# Patient Record
Sex: Female | Born: 1960 | Race: White | Hispanic: No | Marital: Married | Smoking: Current every day smoker
Health system: Southern US, Community
[De-identification: ages and names within clinical notes are randomized; demographics above are authoritative.]

## PROBLEM LIST (undated history)

## (undated) DIAGNOSIS — J449 Chronic obstructive pulmonary disease, unspecified: Secondary | ICD-10-CM

## (undated) DIAGNOSIS — M1990 Unspecified osteoarthritis, unspecified site: Secondary | ICD-10-CM

## (undated) DIAGNOSIS — D72829 Elevated white blood cell count, unspecified: Secondary | ICD-10-CM

## (undated) DIAGNOSIS — E041 Nontoxic single thyroid nodule: Secondary | ICD-10-CM

## (undated) DIAGNOSIS — R519 Headache, unspecified: Secondary | ICD-10-CM

## (undated) DIAGNOSIS — K219 Gastro-esophageal reflux disease without esophagitis: Secondary | ICD-10-CM

## (undated) DIAGNOSIS — N63 Unspecified lump in unspecified breast: Secondary | ICD-10-CM

## (undated) DIAGNOSIS — R51 Headache: Secondary | ICD-10-CM

## (undated) DIAGNOSIS — M419 Scoliosis, unspecified: Secondary | ICD-10-CM

## (undated) DIAGNOSIS — J439 Emphysema, unspecified: Secondary | ICD-10-CM

## (undated) DIAGNOSIS — D751 Secondary polycythemia: Secondary | ICD-10-CM

## (undated) DIAGNOSIS — E162 Hypoglycemia, unspecified: Secondary | ICD-10-CM

## (undated) HISTORY — PX: BREAST BIOPSY: SHX20

## (undated) HISTORY — DX: Unspecified lump in unspecified breast: N63.0

## (undated) HISTORY — PX: TOE SURGERY: SHX1073

## (undated) HISTORY — DX: Chronic obstructive pulmonary disease, unspecified: J44.9

## (undated) HISTORY — DX: Hypoglycemia, unspecified: E16.2

## (undated) HISTORY — PX: ABDOMINAL HYSTERECTOMY: SHX81

## (undated) HISTORY — PX: CEREBRAL ANGIOGRAM: SHX1326

## (undated) HISTORY — DX: Scoliosis, unspecified: M41.9

## (undated) HISTORY — DX: Elevated white blood cell count, unspecified: D72.829

## (undated) HISTORY — PX: CHOLECYSTECTOMY: SHX55

## (undated) HISTORY — DX: Secondary polycythemia: D75.1

## (undated) HISTORY — DX: Headache, unspecified: R51.9

## (undated) HISTORY — DX: Headache: R51

## (undated) HISTORY — DX: Gastro-esophageal reflux disease without esophagitis: K21.9

---
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
## 2003-08-23 DIAGNOSIS — N63 Unspecified lump in unspecified breast: Secondary | ICD-10-CM

## 2003-08-23 HISTORY — DX: Unspecified lump in unspecified breast: N63.0

## 2004-06-15 ENCOUNTER — Emergency Department: Payer: Self-pay | Admitting: Internal Medicine

## 2005-01-26 ENCOUNTER — Emergency Department: Payer: Self-pay | Admitting: Emergency Medicine

## 2005-03-22 ENCOUNTER — Ambulatory Visit: Payer: Self-pay

## 2005-07-08 ENCOUNTER — Ambulatory Visit: Payer: Self-pay

## 2005-08-24 ENCOUNTER — Ambulatory Visit: Payer: Self-pay

## 2005-09-26 ENCOUNTER — Ambulatory Visit: Payer: Self-pay

## 2005-10-20 ENCOUNTER — Ambulatory Visit: Payer: Self-pay

## 2005-11-20 ENCOUNTER — Ambulatory Visit: Payer: Self-pay

## 2006-08-11 ENCOUNTER — Ambulatory Visit: Payer: Self-pay

## 2006-08-31 ENCOUNTER — Ambulatory Visit (HOSPITAL_COMMUNITY): Admission: RE | Admit: 2006-08-31 | Discharge: 2006-08-31 | Payer: Self-pay | Admitting: Neurosurgery

## 2006-11-03 ENCOUNTER — Emergency Department: Payer: Self-pay | Admitting: Emergency Medicine

## 2006-11-03 ENCOUNTER — Other Ambulatory Visit: Payer: Self-pay

## 2006-12-07 ENCOUNTER — Ambulatory Visit: Payer: Self-pay | Admitting: Neurosurgery

## 2009-01-14 ENCOUNTER — Emergency Department: Payer: Self-pay | Admitting: Internal Medicine

## 2009-04-09 ENCOUNTER — Emergency Department: Payer: Self-pay | Admitting: Emergency Medicine

## 2009-08-09 ENCOUNTER — Emergency Department: Payer: Self-pay | Admitting: Emergency Medicine

## 2010-07-26 ENCOUNTER — Emergency Department: Payer: Self-pay | Admitting: Emergency Medicine

## 2011-03-20 ENCOUNTER — Emergency Department: Payer: Self-pay | Admitting: Internal Medicine

## 2011-12-25 ENCOUNTER — Emergency Department: Payer: Self-pay | Admitting: Emergency Medicine

## 2013-09-14 ENCOUNTER — Emergency Department: Payer: Self-pay | Admitting: Emergency Medicine

## 2013-12-27 ENCOUNTER — Ambulatory Visit: Payer: Self-pay | Admitting: Gastroenterology

## 2013-12-27 DIAGNOSIS — Z9889 Other specified postprocedural states: Secondary | ICD-10-CM | POA: Insufficient documentation

## 2013-12-27 LAB — HM COLONOSCOPY

## 2013-12-30 LAB — PATHOLOGY REPORT

## 2014-04-27 ENCOUNTER — Emergency Department: Payer: Self-pay | Admitting: Emergency Medicine

## 2014-06-04 DIAGNOSIS — F17209 Nicotine dependence, unspecified, with unspecified nicotine-induced disorders: Secondary | ICD-10-CM | POA: Insufficient documentation

## 2014-06-13 DIAGNOSIS — E782 Mixed hyperlipidemia: Secondary | ICD-10-CM | POA: Insufficient documentation

## 2014-06-13 DIAGNOSIS — R9439 Abnormal result of other cardiovascular function study: Secondary | ICD-10-CM | POA: Insufficient documentation

## 2014-06-17 ENCOUNTER — Ambulatory Visit: Payer: Self-pay | Admitting: Internal Medicine

## 2014-07-01 DIAGNOSIS — Z9889 Other specified postprocedural states: Secondary | ICD-10-CM | POA: Insufficient documentation

## 2014-07-29 DIAGNOSIS — G5603 Carpal tunnel syndrome, bilateral upper limbs: Secondary | ICD-10-CM | POA: Insufficient documentation

## 2014-07-29 DIAGNOSIS — J449 Chronic obstructive pulmonary disease, unspecified: Secondary | ICD-10-CM | POA: Insufficient documentation

## 2014-10-13 ENCOUNTER — Ambulatory Visit: Payer: Self-pay | Admitting: Internal Medicine

## 2014-10-21 ENCOUNTER — Ambulatory Visit
Admit: 2014-10-21 | Disposition: A | Discharge status: Home / Self Care | Payer: Self-pay | Attending: Internal Medicine | Admitting: Internal Medicine

## 2014-11-13 LAB — HM MAMMOGRAPHY: HM Mammogram: NORMAL (ref 0–4)

## 2014-11-21 ENCOUNTER — Ambulatory Visit
Admit: 2014-11-21 | Disposition: A | Discharge status: Home / Self Care | Payer: Self-pay | Attending: Internal Medicine | Admitting: Internal Medicine

## 2014-11-24 LAB — CANCER CENTER HEMATOCRIT: HCT: 42.8 % (ref 35.0–47.0)

## 2014-12-08 LAB — CANCER CENTER HEMATOCRIT: HCT: 43.6 % (ref 35.0–47.0)

## 2014-12-21 ENCOUNTER — Other Ambulatory Visit: Payer: Self-pay | Admitting: *Deleted

## 2014-12-21 DIAGNOSIS — D751 Secondary polycythemia: Secondary | ICD-10-CM

## 2014-12-22 ENCOUNTER — Other Ambulatory Visit: Payer: Self-pay | Admitting: *Deleted

## 2014-12-22 ENCOUNTER — Inpatient Hospital Stay: Payer: No Typology Code available for payment source | Attending: Internal Medicine

## 2014-12-22 ENCOUNTER — Inpatient Hospital Stay: Payer: No Typology Code available for payment source | Admitting: *Deleted

## 2014-12-22 DIAGNOSIS — D751 Secondary polycythemia: Secondary | ICD-10-CM | POA: Diagnosis present

## 2014-12-22 LAB — HEMATOCRIT: HCT: 44.8 % (ref 35.0–47.0)

## 2014-12-22 NOTE — Progress Notes (Signed)
Molly Banks presents today for phlebotomy per MD orders. Phlebotomy not needed, HCT 44.8

## 2015-01-05 ENCOUNTER — Inpatient Hospital Stay: Payer: No Typology Code available for payment source

## 2015-01-05 ENCOUNTER — Inpatient Hospital Stay: Payer: No Typology Code available for payment source | Attending: Internal Medicine

## 2015-01-05 ENCOUNTER — Other Ambulatory Visit: Payer: Self-pay | Admitting: *Deleted

## 2015-01-05 DIAGNOSIS — D751 Secondary polycythemia: Secondary | ICD-10-CM

## 2015-01-05 LAB — HEMATOCRIT: HEMATOCRIT: 47.3 % — AB (ref 35.0–47.0)

## 2015-01-08 ENCOUNTER — Telehealth: Payer: Self-pay | Admitting: *Deleted

## 2015-01-08 NOTE — Telephone Encounter (Signed)
Over night she has developed numerous bruises in her legs. States she had "blood drawn off yesterday. (Phlebotomy was on 5/16) After consulting with Dr Sherrlyn HockPandit, the patient was advised to go to the ER and she agreed to go

## 2015-01-20 ENCOUNTER — Inpatient Hospital Stay: Payer: No Typology Code available for payment source

## 2015-01-20 ENCOUNTER — Other Ambulatory Visit: Payer: Self-pay

## 2015-01-22 ENCOUNTER — Other Ambulatory Visit: Payer: Self-pay

## 2015-01-22 DIAGNOSIS — D751 Secondary polycythemia: Secondary | ICD-10-CM

## 2015-01-23 ENCOUNTER — Inpatient Hospital Stay: Payer: No Typology Code available for payment source

## 2015-01-23 ENCOUNTER — Inpatient Hospital Stay: Payer: No Typology Code available for payment source | Attending: Internal Medicine

## 2015-01-23 DIAGNOSIS — D751 Secondary polycythemia: Secondary | ICD-10-CM | POA: Insufficient documentation

## 2015-01-23 LAB — HEMATOCRIT: HCT: 42.6 % (ref 35.0–47.0)

## 2015-01-27 ENCOUNTER — Other Ambulatory Visit: Payer: Self-pay

## 2015-01-27 ENCOUNTER — Encounter: Payer: Self-pay | Admitting: Emergency Medicine

## 2015-01-27 ENCOUNTER — Emergency Department
Admission: EM | Admit: 2015-01-27 | Discharge: 2015-01-27 | Disposition: A | Discharge status: Home / Self Care | Payer: No Typology Code available for payment source | Attending: Emergency Medicine | Admitting: Emergency Medicine

## 2015-01-27 ENCOUNTER — Emergency Department: Payer: No Typology Code available for payment source

## 2015-01-27 DIAGNOSIS — Z79899 Other long term (current) drug therapy: Secondary | ICD-10-CM | POA: Insufficient documentation

## 2015-01-27 DIAGNOSIS — R109 Unspecified abdominal pain: Secondary | ICD-10-CM | POA: Diagnosis present

## 2015-01-27 DIAGNOSIS — R0789 Other chest pain: Secondary | ICD-10-CM | POA: Insufficient documentation

## 2015-01-27 DIAGNOSIS — Z720 Tobacco use: Secondary | ICD-10-CM | POA: Diagnosis not present

## 2015-01-27 DIAGNOSIS — R05 Cough: Secondary | ICD-10-CM | POA: Diagnosis not present

## 2015-01-27 DIAGNOSIS — Z7982 Long term (current) use of aspirin: Secondary | ICD-10-CM | POA: Insufficient documentation

## 2015-01-27 LAB — URINALYSIS COMPLETE WITH MICROSCOPIC (ARMC ONLY)
BILIRUBIN URINE: NEGATIVE
Glucose, UA: NEGATIVE mg/dL
KETONES UR: NEGATIVE mg/dL
Leukocytes, UA: NEGATIVE
Nitrite: NEGATIVE
PH: 7 (ref 5.0–8.0)
Protein, ur: NEGATIVE mg/dL
SPECIFIC GRAVITY, URINE: 1.003 — AB (ref 1.005–1.030)
Squamous Epithelial / LPF: NONE SEEN
WBC UA: NONE SEEN WBC/hpf (ref 0–5)

## 2015-01-27 LAB — COMPREHENSIVE METABOLIC PANEL
ALK PHOS: 100 U/L (ref 38–126)
ALT: 8 U/L — AB (ref 14–54)
AST: 20 U/L (ref 15–41)
Albumin: 3.8 g/dL (ref 3.5–5.0)
Anion gap: 9 (ref 5–15)
BUN: 9 mg/dL (ref 6–20)
CO2: 27 mmol/L (ref 22–32)
CREATININE: 0.7 mg/dL (ref 0.44–1.00)
Calcium: 8.9 mg/dL (ref 8.9–10.3)
Chloride: 108 mmol/L (ref 101–111)
GFR calc non Af Amer: >60 mL/min (ref >60)
GLUCOSE: 90 mg/dL (ref 65–99)
Potassium: 3.5 mmol/L (ref 3.5–5.1)
Sodium: 144 mmol/L (ref 135–145)
Total Bilirubin: 0.4 mg/dL (ref 0.3–1.2)
Total Protein: 6.7 g/dL (ref 6.5–8.1)

## 2015-01-27 LAB — CBC WITH DIFFERENTIAL/PLATELET
BASOS ABS: 0.1 10*3/uL (ref 0–0.1)
BASOS PCT: 1 %
EOS ABS: 0.8 10*3/uL — AB (ref 0–0.7)
EOS PCT: 8 %
HCT: 37.7 % (ref 35.0–47.0)
Hemoglobin: 12.7 g/dL (ref 12.0–16.0)
Lymphocytes Relative: 39 %
Lymphs Abs: 4 10*3/uL — ABNORMAL HIGH (ref 1.0–3.6)
MCH: 28.7 pg (ref 26.0–34.0)
MCHC: 33.7 g/dL (ref 32.0–36.0)
MCV: 85.2 fL (ref 80.0–100.0)
Monocytes Absolute: 0.8 10*3/uL (ref 0.2–0.9)
Monocytes Relative: 8 %
NEUTROS PCT: 44 %
Neutro Abs: 4.3 10*3/uL (ref 1.4–6.5)
PLATELETS: 315 10*3/uL (ref 150–440)
RBC: 4.43 MIL/uL (ref 3.80–5.20)
RDW: 14.2 % (ref 11.5–14.5)
WBC: 10.1 10*3/uL (ref 3.6–11.0)

## 2015-01-27 LAB — LIPASE, BLOOD: Lipase: 46 U/L (ref 22–51)

## 2015-01-27 LAB — TROPONIN I: Troponin I: <0.03 ng/mL (ref <0.031)

## 2015-01-27 MED ORDER — IPRATROPIUM-ALBUTEROL 0.5-2.5 (3) MG/3ML IN SOLN
RESPIRATORY_TRACT | Status: AC
  Administered 2015-01-27: 3 mL via RESPIRATORY_TRACT
  Filled 2015-01-27: qty 3

## 2015-01-27 MED ORDER — KETOROLAC TROMETHAMINE 30 MG/ML IJ SOLN
INTRAMUSCULAR | Status: AC
  Administered 2015-01-27: 30 mg via INTRAMUSCULAR
  Filled 2015-01-27: qty 1

## 2015-01-27 MED ORDER — NAPROXEN 500 MG PO TABS: 500.0000 mg | ORAL_TABLET | Freq: Two times a day (BID) | ORAL | Status: DC

## 2015-01-27 MED ORDER — KETOROLAC TROMETHAMINE 30 MG/ML IJ SOLN
30.0000 mg | Freq: Once | INTRAMUSCULAR | Status: AC
  Administered 2015-01-27: 30 mg via INTRAMUSCULAR

## 2015-01-27 MED ORDER — IPRATROPIUM-ALBUTEROL 0.5-2.5 (3) MG/3ML IN SOLN
3.0000 mL | Freq: Once | RESPIRATORY_TRACT | Status: AC
  Administered 2015-01-27: 3 mL via RESPIRATORY_TRACT

## 2015-01-27 NOTE — Discharge Instructions (Signed)

## 2015-01-27 NOTE — ED Notes (Signed)
Patient ambulatory to triage with steady gait, without difficulty or distress noted; pt reports right upper abd pain last couple days with no accomp symptoms; denies hx of same

## 2015-01-27 NOTE — ED Notes (Signed)
Patient transported to [facility] 

## 2015-01-27 NOTE — ED Notes (Signed)
Pt has pain beneath right breast.  Sx for 3 days.  Painful to cough.  Non productive. cig smoker.  No back pain. No sob.  No n/v/d.

## 2015-01-27 NOTE — ED Provider Notes (Signed)
[facility] Emergency Department Provider Note  ____________________________________________  Time seen: 10 PM  I have reviewed the triage vital signs and the nursing notes.   HISTORY  Chief Complaint Abdominal Pain      HPI Molly Banks is a 54 y.o. female who complains of right lower rib pain that is moderate but becomes severe when pressed. She describes the pain as aching generally but then sharp with pressure. She does note that she has had a cough for several days and the pain has been getting worse. She reports a history of smoking and says she has a smoker's cough. No nausea no vomiting. No fevers no chills.     Past Medical History  Diagnosis Date  . Erythrocytosis   . Leukocytosis   . COPD (chronic obstructive pulmonary disease)   . GERD (gastroesophageal reflux disease)     There are no active problems to display for this patient.   Past Surgical History  Procedure Laterality Date  . Cholecystectomy    . Abdominal hysterectomy    . Tonsillectomy      Current Outpatient Rx  Name  Route  Sig  Dispense  Refill  . albuterol (PROVENTIL HFA;VENTOLIN HFA) 108 (90 BASE) MCG/ACT inhaler   Inhalation   Inhale into the lungs.         . ALPRAZolam (XANAX) 0.25 MG tablet   Oral   Take by mouth.         Marland Kitchen aspirin 81 MG tablet   Oral   Take by mouth.         . Cyanocobalamin (B-12) 1000 MCG CAPS   Oral   Take by mouth.           Allergies Guaifenesin  Family History  Problem Relation Age of Onset  . Brain cancer Mother   . Lung cancer Father   . Lymphoma Sister   . Breast cancer Sister     Social History History  Substance Use Topics  . Smoking status: Current Every Day Smoker -- 0.50 packs/day for 30 years  . Smokeless tobacco: Current User  . Alcohol Use: No    Review of Systems  Constitutional: Negative for fever. Eyes: Negative for visual changes. ENT: Negative for sore throat Cardiovascular: Positive  for right lower rib pain Respiratory: Negative for shortness of breath. Positive for cough Gastrointestinal: Negative for abdominal pain, vomiting and diarrhea. Genitourinary: Negative for dysuria. Musculoskeletal: Negative for back pain. Skin: Negative for rash. Neurological: Negative for headaches or focal weakness   10-point ROS otherwise negative.  ____________________________________________   PHYSICAL EXAM:  VITAL SIGNS: ED Triage Vitals  Enc Vitals Group     BP 01/27/15 2111 96/65 mmHg     Pulse Rate 01/27/15 2111 84     Resp 01/27/15 2111 20     Temp 01/27/15 2111 98.1 F (36.7 C)     Temp Source 01/27/15 2111 Oral     SpO2 01/27/15 2111 99 %     Weight 01/27/15 2111 126 lb (57.153 kg)     Height 01/27/15 2111  (1.575 m)     Head Cir --      Peak Flow --      Pain Score 01/27/15 2111 7     Pain Loc --      Pain Edu? --      Excl. in GC? --      Constitutional: Alert and oriented. Well appearing and in no distress. Eyes: Conjunctivae are normal.  PERRL. ENT   Head: Normocephalic and atraumatic.   Nose: No rhinnorhea.   Mouth/Throat: Mucous membranes are moist. Cardiovascular: Normal rate, regular rhythm. Normal and symmetric distal pulses are present in all extremities. No murmurs, rubs, or gallops. Respiratory: Normal respiratory effort without tachypnea nor retractions. Breath sounds are clear and equal bilaterally.  Gastrointestinal: Soft and non-tender in all quadrants. No distention. There is no CVA tenderness. Genitourinary: deferred Musculoskeletal: Nontender with normal range of motion in all extremities. No lower extremity tenderness nor edema. Patient with significant tenderness to palpation of the right lower chest. No rash or injury appreciated Neurologic:  Normal speech and language. No gross focal neurologic deficits are appreciated. Skin:  Skin is warm, dry and intact. No rash noted. Psychiatric: Mood and affect are normal. Patient  exhibits appropriate insight and judgment.  ____________________________________________    LABS (pertinent positives/negatives)  Labs Reviewed  CBC WITH DIFFERENTIAL/PLATELET - Abnormal; Notable for the following:    Lymphs Abs 4.0 (*)    Eosinophils Absolute 0.8 (*)    All other components within normal limits  COMPREHENSIVE METABOLIC PANEL - Abnormal; Notable for the following:    ALT 8 (*)    All other components within normal limits  URINALYSIS COMPLETEWITH MICROSCOPIC ([facility] ONLY) - Abnormal; Notable for the following:    Color, Urine COLORLESS (*)    APPearance CLEAR (*)    Specific Gravity, Urine 1.003 (*)    Hgb urine dipstick 1+ (*)    Bacteria, UA RARE (*)    All other components within normal limits  LIPASE, BLOOD  TROPONIN I    ____________________________________________   EKG  ED ECG REPORT I, Jene EveryKINNER, Donnita Farina, the attending physician, personally viewed and interpreted this ECG.   Date: 01/27/2015  EKG Time: 9:31 PM  Rate: 81  Rhythm: normal EKG, normal sinus rhythm  Axis: Normal  Intervals:none  ST&T Change: Normal   ____________________________________________    RADIOLOGY  Chest x-Stallbaumer normal  ____________________________________________   PROCEDURES  Procedure(s) performed: none  Critical Care performed: none  ____________________________________________   INITIAL IMPRESSION / ASSESSMENT AND PLAN / ED COURSE  Pertinent labs & imaging results that were available during my care of the patient were reviewed by me and considered in my medical decision making (see chart for details).  Patient overall well-appearing. She has significant tenderness to palpation of the right lower chest wall. Differential diagnosis includes costochondritis, pneumonia, muscle injury, pleurisy. We'll obtain chest x-Frankowski and blood work and reevaluate the patient after giving pain medications ----------------------------------------- 11:35 PM on  01/27/2015 -----------------------------------------  Patient significantly better after the Toradol, I suspect this is muscular skeletal pain. Patient will follow-up with her PCP we will treat with NSAIDs ____________________________________________   FINAL CLINICAL IMPRESSION(S) / ED DIAGNOSES  Final diagnoses:  Chest wall pain     Jene Everyobert Breelyn Icard, MD 01/27/15 [phone]

## 2015-02-02 ENCOUNTER — Inpatient Hospital Stay: Payer: No Typology Code available for payment source

## 2015-02-02 DIAGNOSIS — D751 Secondary polycythemia: Secondary | ICD-10-CM

## 2015-02-02 LAB — HEMATOCRIT: HCT: 40 % (ref 35.0–47.0)

## 2015-02-05 ENCOUNTER — Other Ambulatory Visit: Payer: Self-pay | Admitting: *Deleted

## 2015-02-16 ENCOUNTER — Inpatient Hospital Stay: Payer: No Typology Code available for payment source

## 2015-02-16 ENCOUNTER — Other Ambulatory Visit: Payer: Self-pay

## 2015-02-16 DIAGNOSIS — D751 Secondary polycythemia: Secondary | ICD-10-CM | POA: Diagnosis not present

## 2015-02-16 LAB — HEMATOCRIT: HCT: 41.2 % (ref 35.0–47.0)

## 2015-02-17 LAB — MISC LABCORP TEST (SEND OUT): Labcorp test code: 7187

## 2015-03-02 ENCOUNTER — Inpatient Hospital Stay: Payer: No Typology Code available for payment source

## 2015-03-02 ENCOUNTER — Inpatient Hospital Stay: Payer: No Typology Code available for payment source | Attending: Internal Medicine | Admitting: Internal Medicine

## 2015-03-02 VITALS — BP 116/72 | HR 71 | Temp 98.7°F | Resp 18 | Ht 62.0 in | Wt 114.9 lb

## 2015-03-02 DIAGNOSIS — Z800 Family history of malignant neoplasm of digestive organs: Secondary | ICD-10-CM | POA: Diagnosis not present

## 2015-03-02 DIAGNOSIS — R05 Cough: Secondary | ICD-10-CM | POA: Insufficient documentation

## 2015-03-02 DIAGNOSIS — Z809 Family history of malignant neoplasm, unspecified: Secondary | ICD-10-CM | POA: Insufficient documentation

## 2015-03-02 DIAGNOSIS — K219 Gastro-esophageal reflux disease without esophagitis: Secondary | ICD-10-CM | POA: Diagnosis not present

## 2015-03-02 DIAGNOSIS — D72829 Elevated white blood cell count, unspecified: Secondary | ICD-10-CM | POA: Diagnosis not present

## 2015-03-02 DIAGNOSIS — D751 Secondary polycythemia: Secondary | ICD-10-CM | POA: Diagnosis not present

## 2015-03-02 DIAGNOSIS — Z79899 Other long term (current) drug therapy: Secondary | ICD-10-CM | POA: Insufficient documentation

## 2015-03-02 DIAGNOSIS — R0609 Other forms of dyspnea: Secondary | ICD-10-CM | POA: Insufficient documentation

## 2015-03-02 DIAGNOSIS — Z7982 Long term (current) use of aspirin: Secondary | ICD-10-CM | POA: Insufficient documentation

## 2015-03-02 DIAGNOSIS — E538 Deficiency of other specified B group vitamins: Secondary | ICD-10-CM | POA: Diagnosis not present

## 2015-03-02 DIAGNOSIS — Z803 Family history of malignant neoplasm of breast: Secondary | ICD-10-CM | POA: Diagnosis not present

## 2015-03-02 DIAGNOSIS — J449 Chronic obstructive pulmonary disease, unspecified: Secondary | ICD-10-CM | POA: Diagnosis not present

## 2015-03-02 DIAGNOSIS — F17210 Nicotine dependence, cigarettes, uncomplicated: Secondary | ICD-10-CM | POA: Insufficient documentation

## 2015-03-02 LAB — CBC WITH DIFFERENTIAL/PLATELET
BASOS ABS: 0.3 10*3/uL — AB (ref 0–0.1)
BASOS PCT: 3 %
EOS PCT: 6 %
Eosinophils Absolute: 0.5 10*3/uL (ref 0–0.7)
HEMATOCRIT: 43.1 % (ref 35.0–47.0)
Hemoglobin: 14.1 g/dL (ref 12.0–16.0)
LYMPHS ABS: 3.9 10*3/uL — AB (ref 1.0–3.6)
Lymphocytes Relative: 47 %
MCH: 27.6 pg (ref 26.0–34.0)
MCHC: 32.7 g/dL (ref 32.0–36.0)
MCV: 84.4 fL (ref 80.0–100.0)
Monocytes Absolute: 0.8 10*3/uL (ref 0.2–0.9)
Monocytes Relative: 9 %
NEUTROS PCT: 35 %
Neutro Abs: 3 10*3/uL (ref 1.4–6.5)
Platelets: 314 10*3/uL (ref 150–440)
RBC: 5.11 MIL/uL (ref 3.80–5.20)
RDW: 13.8 % (ref 11.5–14.5)
WBC: 8.4 10*3/uL (ref 3.6–11.0)

## 2015-03-02 NOTE — Progress Notes (Signed)
Pt here for f/u.  Pt has HA but she feels it is cause of the heat.

## 2015-03-10 NOTE — Progress Notes (Signed)
[facility]  [phone] Fax:([phone]) [phone]     ID: Molly Banks OB: 07-24-1961  MR#: [id number]  JYN#:[id number]  Patient Care Team: Leotis Shames, MD as PCP - General (Internal Medicine)  CHIEF COMPLAINT/DIAGNOSIS:  Persistent Erythrocytosis, mild Leukocytosis likely secondary to history of chronic smoking. Workup done on 10/13/2014 - hemoglobin 16.7, hematocrit 49.1, platelets 312, WBC 10,800, carboxyhemoglobin elevated at 7.1%, serum erythropoietin 7.5, iron study unremarkable, JAK2 V617F mutation and exon 12 analysis negative. Carboxyhemoglobin 8.3% on 02/11/2015. Gets Phlebotomy 300 mL of hematocrit 46 or higher  HISTORY OF PRESENT ILLNESS:  Patient returns for continued hematology followup, she was seen a few months ago. Hematocrit monitored in between was 49.1 on February 22, since then it has been in the range of 40.0-43.6, today it is 43.1. States that she is doing about the same and denies any new complaints, just chronic dyspnea on exertion and occasional uses inhaler. Chronic dry cough is mild and unchanged. No history of DVT, PE, TIA, stroke, or heart attack. Appetite is good. No fevers or night sweats.   REVIEW OF SYSTEMS:   ROS As in HPI above. In addition, no fever, chills or sweats. No new headaches or focal weakness.  No new mood disturbances. No  sore throat, dysphagia, hemoptysis or chest pain. No dizziness or palpitation. No abdominal pain, constipation, diarrhea, dysuria or hematuria. No new skin rash or bleeding symptoms. No new paresthesias in extremities.    PAST MEDICAL HISTORY: Reviewed. Past Medical History  Diagnosis Date  . Erythrocytosis   . Leukocytosis   . COPD (chronic obstructive pulmonary disease)   . GERD (gastroesophageal reflux disease)     PAST SURGICAL HISTORY: Reviewed. Past Surgical History  Procedure Laterality Date  . Cholecystectomy    . Abdominal hysterectomy    . Tonsillectomy      FAMILY HISTORY:  Reviewed. Family History  Problem Relation Age of Onset  . Brain cancer Mother   . Lung cancer Father   . Lymphoma Sister   . Breast cancer Sister     SOCIAL HISTORY: Reviewed. History  Substance Use Topics  . Smoking status: Current Every Day Smoker -- 0.50 packs/day for 30 years  . Smokeless tobacco: Current User  . Alcohol Use: No    Allergies  Allergen Reactions  . Guaifenesin Shortness Of Breath    Current Outpatient Prescriptions  Medication Sig Dispense Refill  . albuterol (PROVENTIL HFA;VENTOLIN HFA) 108 (90 BASE) MCG/ACT inhaler Inhale into the lungs.    Marland Kitchen aspirin 81 MG tablet Take by mouth.    . Cyanocobalamin (B-12) 1000 MCG CAPS Take by mouth.     No current facility-administered medications for this visit.    PHYSICAL EXAM: Filed Vitals:   03/02/15 1034  BP: 116/72  Pulse: 71  Temp: 98.7 F (37.1 C)  Resp: 18     Body mass index is 21 kg/(m^2).      GENERAL: Patient is alert and oriented and in no acute distress. There is no icterus. HEENT: EOMs intact. Oral exam negative for thrush or lesions. No cervical lymphadenopathy. CVS: S1S2, regular LUNGS: Bilaterally clear to auscultation, no rhonchi. ABDOMEN: Soft, nontender. No hepatosplenomegaly clinically.  EXTREMITIES: No pedal edema.  LAB RESULTS:    Component Value Date/Time   NA 144 01/27/2015 2132   K 3.5 01/27/2015 2132   CL 108 01/27/2015 2132   CO2 27 01/27/2015 2132   GLUCOSE 90 01/27/2015 2132   BUN 9 01/27/2015 2132   CREATININE 0.70  01/27/2015 2132   CALCIUM 8.9 01/27/2015 2132   PROT 6.7 01/27/2015 2132   ALBUMIN 3.8 01/27/2015 2132   AST 20 01/27/2015 2132   ALT 8* 01/27/2015 2132   ALKPHOS 100 01/27/2015 2132   BILITOT 0.4 01/27/2015 2132   GFRNONAA >60 01/27/2015 2132   GFRAA >60 01/27/2015 2132   Lab Results  Component Value Date   WBC 8.4 03/02/2015   NEUTROABS 3.0 03/02/2015   HGB 14.1 03/02/2015   HCT 43.1 03/02/2015   MCV 84.4 03/02/2015   PLT 314 03/02/2015    March 2016 - intrinsic factor antibody negative (0.9)  STUDIES: 09/30/14 - Hb 16.2, hematocrit 48.1%, platelets 220, WBC 12500, 60% neutrophils, ANC 7570, 24% lymphocytes, ALC 2978, 11.8% monocytes, AMC 1480, 3% eosinophils, 0.6% basophils, B12 low at 194, folate unremarkable.  October 2015 - Hb 15.2, Hct 44.8, WBC 9700 with unremarkable differential.  ASSESSMENT / PLAN:   1. Persistent Erythrocytosis, mild Leukocytosis likely secondary to chronic smoking  -  reviewed labs from today and recent, and discussed with patient. Hematocrit seems to be under better control and today also it is far to 3.1, she does not need phlebotomy today. Blood carboxyhemoglobin recently was elevated at 8.3%, have explained to patient that this is secondary to smoking and that she needs to quit smoking. Plan otherwise is to continue phlebotomy to keep hematocrit under control, we will monitor hematocrit once every 8 weeks and pursue phlebotomy 300 mL if it is 46 or higher.  Next MD followup at 48 weeks with CBC and make further plan of management.  2. h/o B12 deficiency - started on oral B12 in Feb 2016. Intrinsic factor antibody negative. 3. Smoking Cessation - Have again explained extreme importance of quitting smoking, patient states that she is trying to quit on her own and does not want to try patches or medication. Smoking cessation info provided. 4. In between visits, patient advised to call or come to ER in case of any new symptoms or sickness.       Janese BanksSandeep Mart Colpitts, MD   03/10/2015 7:59 AM

## 2015-04-27 ENCOUNTER — Inpatient Hospital Stay: Payer: No Typology Code available for payment source

## 2015-04-28 ENCOUNTER — Inpatient Hospital Stay: Payer: No Typology Code available for payment source | Attending: Internal Medicine

## 2015-04-28 ENCOUNTER — Inpatient Hospital Stay: Payer: No Typology Code available for payment source

## 2015-04-28 DIAGNOSIS — D751 Secondary polycythemia: Secondary | ICD-10-CM | POA: Diagnosis present

## 2015-04-28 LAB — CBC WITH DIFFERENTIAL/PLATELET
Basophils Absolute: 0.1 10*3/uL (ref 0–0.1)
Basophils Relative: 1 %
EOS ABS: 0.5 10*3/uL (ref 0–0.7)
Eosinophils Relative: 5 %
HCT: 44.1 % (ref 35.0–47.0)
HEMOGLOBIN: 15 g/dL (ref 12.0–16.0)
Lymphocytes Relative: 34 %
Lymphs Abs: 3.5 10*3/uL (ref 1.0–3.6)
MCH: 27.9 pg (ref 26.0–34.0)
MCHC: 34 g/dL (ref 32.0–36.0)
MCV: 82.2 fL (ref 80.0–100.0)
Monocytes Absolute: 0.9 10*3/uL (ref 0.2–0.9)
Monocytes Relative: 9 %
Neutro Abs: 5.2 10*3/uL (ref 1.4–6.5)
Neutrophils Relative %: 51 %
Platelets: 287 10*3/uL (ref 150–440)
RBC: 5.37 MIL/uL — ABNORMAL HIGH (ref 3.80–5.20)
RDW: 14.2 % (ref 11.5–14.5)
WBC: 10.2 10*3/uL (ref 3.6–11.0)

## 2015-05-08 DIAGNOSIS — M4722 Other spondylosis with radiculopathy, cervical region: Secondary | ICD-10-CM | POA: Insufficient documentation

## 2015-06-22 ENCOUNTER — Inpatient Hospital Stay: Payer: No Typology Code available for payment source | Attending: Family Medicine

## 2015-06-22 ENCOUNTER — Inpatient Hospital Stay: Payer: No Typology Code available for payment source

## 2015-06-22 DIAGNOSIS — D751 Secondary polycythemia: Secondary | ICD-10-CM | POA: Insufficient documentation

## 2015-06-22 LAB — CBC WITH DIFFERENTIAL/PLATELET
BASOS ABS: 0.1 10*3/uL (ref 0–0.1)
Basophils Relative: 1 %
Eosinophils Absolute: 0.4 10*3/uL (ref 0–0.7)
Eosinophils Relative: 5 %
HEMATOCRIT: 44.5 % (ref 35.0–47.0)
Hemoglobin: 14.9 g/dL (ref 12.0–16.0)
LYMPHS PCT: 28 %
Lymphs Abs: 2.5 10*3/uL (ref 1.0–3.6)
MCH: 28 pg (ref 26.0–34.0)
MCHC: 33.6 g/dL (ref 32.0–36.0)
MCV: 83.4 fL (ref 80.0–100.0)
MONO ABS: 0.9 10*3/uL (ref 0.2–0.9)
Monocytes Relative: 10 %
NEUTROS ABS: 5.1 10*3/uL (ref 1.4–6.5)
Neutrophils Relative %: 56 %
Platelets: 290 10*3/uL (ref 150–440)
RBC: 5.34 MIL/uL — AB (ref 3.80–5.20)
RDW: 15.1 % — AB (ref 11.5–14.5)
WBC: 9.1 10*3/uL (ref 3.6–11.0)

## 2015-08-17 ENCOUNTER — Inpatient Hospital Stay: Payer: No Typology Code available for payment source

## 2015-08-19 ENCOUNTER — Other Ambulatory Visit: Payer: No Typology Code available for payment source

## 2015-08-20 ENCOUNTER — Inpatient Hospital Stay: Payer: No Typology Code available for payment source | Attending: Family Medicine

## 2015-08-20 ENCOUNTER — Inpatient Hospital Stay: Payer: No Typology Code available for payment source

## 2015-08-20 DIAGNOSIS — D751 Secondary polycythemia: Secondary | ICD-10-CM | POA: Diagnosis present

## 2015-08-20 LAB — CBC WITH DIFFERENTIAL/PLATELET
BASOS PCT: 1 %
Basophils Absolute: 0.1 10*3/uL (ref 0–0.1)
Eosinophils Absolute: 0.5 10*3/uL (ref 0–0.7)
Eosinophils Relative: 5 %
HEMATOCRIT: 44.6 % (ref 35.0–47.0)
Hemoglobin: 14.9 g/dL (ref 12.0–16.0)
Lymphocytes Relative: 34 %
Lymphs Abs: 3.1 10*3/uL (ref 1.0–3.6)
MCH: 28.2 pg (ref 26.0–34.0)
MCHC: 33.5 g/dL (ref 32.0–36.0)
MCV: 84.3 fL (ref 80.0–100.0)
MONOS PCT: 10 %
Monocytes Absolute: 0.9 10*3/uL (ref 0.2–0.9)
NEUTROS ABS: 4.6 10*3/uL (ref 1.4–6.5)
Neutrophils Relative %: 50 %
Platelets: 277 10*3/uL (ref 150–440)
RBC: 5.28 MIL/uL — ABNORMAL HIGH (ref 3.80–5.20)
RDW: 14 % (ref 11.5–14.5)
WBC: 9.2 10*3/uL (ref 3.6–11.0)

## 2015-10-10 ENCOUNTER — Encounter: Payer: Self-pay | Admitting: Emergency Medicine

## 2015-10-10 ENCOUNTER — Emergency Department: Payer: BLUE CROSS/BLUE SHIELD

## 2015-10-10 ENCOUNTER — Emergency Department
Admission: EM | Admit: 2015-10-10 | Discharge: 2015-10-10 | Disposition: A | Discharge status: Home / Self Care | Payer: BLUE CROSS/BLUE SHIELD | Attending: Emergency Medicine | Admitting: Emergency Medicine

## 2015-10-10 DIAGNOSIS — S29001A Unspecified injury of muscle and tendon of front wall of thorax, initial encounter: Secondary | ICD-10-CM | POA: Insufficient documentation

## 2015-10-10 DIAGNOSIS — Y9289 Other specified places as the place of occurrence of the external cause: Secondary | ICD-10-CM | POA: Insufficient documentation

## 2015-10-10 DIAGNOSIS — S6991XA Unspecified injury of right wrist, hand and finger(s), initial encounter: Secondary | ICD-10-CM | POA: Diagnosis present

## 2015-10-10 DIAGNOSIS — S4991XA Unspecified injury of right shoulder and upper arm, initial encounter: Secondary | ICD-10-CM | POA: Insufficient documentation

## 2015-10-10 DIAGNOSIS — X58XXXA Exposure to other specified factors, initial encounter: Secondary | ICD-10-CM | POA: Insufficient documentation

## 2015-10-10 DIAGNOSIS — T148 Other injury of unspecified body region: Secondary | ICD-10-CM | POA: Insufficient documentation

## 2015-10-10 DIAGNOSIS — Z7982 Long term (current) use of aspirin: Secondary | ICD-10-CM | POA: Insufficient documentation

## 2015-10-10 DIAGNOSIS — Y990 Civilian activity done for income or pay: Secondary | ICD-10-CM | POA: Insufficient documentation

## 2015-10-10 DIAGNOSIS — Y9389 Activity, other specified: Secondary | ICD-10-CM | POA: Diagnosis not present

## 2015-10-10 DIAGNOSIS — F17210 Nicotine dependence, cigarettes, uncomplicated: Secondary | ICD-10-CM | POA: Diagnosis not present

## 2015-10-10 DIAGNOSIS — T148XXA Other injury of unspecified body region, initial encounter: Secondary | ICD-10-CM

## 2015-10-10 MED ORDER — IBUPROFEN 600 MG PO TABS: 600.0000 mg | ORAL_TABLET | Freq: Three times a day (TID) | ORAL | Status: DC | PRN

## 2015-10-10 MED ORDER — KETOROLAC TROMETHAMINE 60 MG/2ML IM SOLN
60.0000 mg | Freq: Once | INTRAMUSCULAR | Status: AC
  Administered 2015-10-10: 60 mg via INTRAMUSCULAR
  Filled 2015-10-10: qty 2

## 2015-10-10 NOTE — Discharge Instructions (Signed)
It looks like U stranger muscles in your wrist and year chest wall. Please use the Motrin 600 mg one pill 3 times a day with some food. Use ice or heat to the area which ever makes it feel better. Follow-up with your doctor or see Dr. Hyacinth Meeker the orthopedic surgeon if no better in 3 or 4 days. Please return here if you're worse. Not fall asleep with the ice or heat in places you can get burns from them.

## 2015-10-10 NOTE — ED Provider Notes (Signed)
[facility] Emergency Department Provider Note  ____________________________________________  Time seen: Approximately 10:01 AM  I have reviewed the triage vital signs and the nursing notes.   HISTORY  Chief Complaint Shoulder Pain   HPI Molly Banks is a 55 y.o. female who reports she was working hard yesterday using her arms. Today she has felt pain in her right wrist and right upper chest just below the clavicle. It's worse with movement and palpation. She denies any fever or cough shortness of breath. She denies any pain with deep breathing patient has not had this before. Patient does have a history of polycythemia.   Past Medical History  Diagnosis Date  . Erythrocytosis   . Leukocytosis   . COPD (chronic obstructive pulmonary disease) (HCC)   . GERD (gastroesophageal reflux disease)     Patient Active Problem List   Diagnosis Date Noted  . 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 Cholecystectomy    . Abdominal hysterectomy    . Tonsillectomy      Current Outpatient Rx  Name  Route  Sig  Dispense  Refill  . albuterol (PROVENTIL HFA;VENTOLIN HFA) 108 (90 BASE) MCG/ACT inhaler   Inhalation   Inhale 1-2 puffs into the lungs every 6 (six) hours as needed for wheezing or shortness of breath.          Marland Kitchen aspirin EC 325 MG tablet   Oral   Take 325 mg by mouth daily as needed. For pain.         . Cyanocobalamin (RA VITAMIN B-12 TR) 1000 MCG TBCR   Oral   Take 1,000 mcg by mouth daily as needed. For health.         . ibuprofen (ADVIL,MOTRIN) 600 MG tablet   Oral   Take 1 tablet (600 mg total) by mouth every 8 (eight) hours as needed.   15 tablet   0     Allergies Guaifenesin  Family History  Problem Relation Age of Onset  . Brain cancer Mother   . Lung cancer Father   . Lymphoma Sister   . Breast cancer Sister     Social History Social History  Substance Use Topics  .  Smoking status: Current Every Day Smoker -- 0.50 packs/day for 30 years    Types: Cigarettes  . Smokeless tobacco: Current User  . Alcohol Use: No    Review of Systems Constitutional: No fever/chills Eyes: No visual changes. ENT: No sore throat. Cardiovascular: See history of present illness Respiratory: Denies shortness of breath. Gastrointestinal: No abdominal pain.  No nausea, no vomiting.  No diarrhea.  No constipation. Genitourinary: Negative for dysuria. Musculoskeletal: Negative for back pain. Skin: Negative for rash.  10-point ROS otherwise negative.  ____________________________________________   PHYSICAL EXAM:  VITAL SIGNS: ED Triage Vitals  Enc Vitals Group     BP 10/10/15 0804 114/79 mmHg     Pulse Rate 10/10/15 0804 85     Resp 10/10/15 0804 18     Temp 10/10/15 0804 97.4 F (36.3 C)     Temp Source 10/10/15 0804 Oral     SpO2 10/10/15 0804 100 %     Weight 10/10/15 0804 130 lb (58.968 kg)     Height 10/10/15 0804  (1.575 m)     Head Cir --      Peak Flow --      Pain Score 10/10/15 0805 9     Pain  Loc --      Pain Edu? --      Excl. in GC? --     Constitutional: Alert and oriented. Well appearing and in no acute distress. Eyes: Conjunctivae are normal. PERRL. EOMI. Head: Atraumatic. Nose: No congestion/rhinnorhea. Mouth/Throat: Mucous membranes are moist.  Oropharynx non-erythematous. Neck: No stridor. Cardiovascular: Normal rate, regular rhythm. Grossly normal heart sounds.  Good peripheral circulation. Respiratory: Normal respiratory effort.  No retractions. Lungs CTAB. Chest is tender to palpation just below the right clavicle on the chest wall. Palpation there exactly reproduces her pain. Gastrointestinal: Soft and nontender. No distention. No abdominal bruits. No CVA tenderness. Musculoskeletal: No lower extremity tenderness nor edema.  No joint effusions. The right wrist is also tender. The wrist is tender to palpation there is no pain on  palpation anywhere else in the arm. There is pain with movement of the wrist or the shoulder. Pulses normal in the wrist capillary refill and sensation is normal in the finger strength is normal in fingers. Neurologic:  Normal speech and language. No gross focal neurologic deficits are appreciated.  Skin:  Skin is warm, dry and intact. No rash noted. Psychiatric: Mood and affect are normal. Speech and behavior are normal.  ____________________________________________   LABS (all labs ordered are listed, but only abnormal results are displayed)  Labs Reviewed - No data to display ____________________________________________  EKG  EKG read and interpreted by me shows normal sinus rhythm rate of 69 normal axis essentially normal EKG ____________________________________________  RADIOLOGY  Chest x-Manske and wrist x-Nanninga read by radiology as no acute disease ____________________________________________   PROCEDURES    ____________________________________________   INITIAL IMPRESSION / ASSESSMENT AND PLAN / ED COURSE  Pertinent labs & imaging results that were available during my care of the patient were reviewed by me and considered in my medical decision making (see chart for details).   ____________________________________________   FINAL CLINICAL IMPRESSION(S) / ED DIAGNOSES  Final diagnoses:  Muscle strain      Arnaldo Natal, MD 10/10/15 1009

## 2015-10-10 NOTE — ED Notes (Signed)
L shoulder pain since last night. 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 No other injury noted. Painful with slight movement.

## 2015-10-12 ENCOUNTER — Inpatient Hospital Stay: Payer: BLUE CROSS/BLUE SHIELD

## 2015-10-12 ENCOUNTER — Inpatient Hospital Stay: Payer: BLUE CROSS/BLUE SHIELD | Attending: Internal Medicine

## 2015-10-12 DIAGNOSIS — D751 Secondary polycythemia: Secondary | ICD-10-CM | POA: Insufficient documentation

## 2015-10-12 LAB — CBC WITH DIFFERENTIAL/PLATELET
Basophils Absolute: 0.1 10*3/uL (ref 0–0.1)
Basophils Relative: 1 %
EOS ABS: 0.7 10*3/uL (ref 0–0.7)
Eosinophils Relative: 8 %
HCT: 44.8 % (ref 35.0–47.0)
Hemoglobin: 15.1 g/dL (ref 12.0–16.0)
LYMPHS ABS: 2.9 10*3/uL (ref 1.0–3.6)
LYMPHS PCT: 32 %
MCH: 28.8 pg (ref 26.0–34.0)
MCHC: 33.8 g/dL (ref 32.0–36.0)
MCV: 85.1 fL (ref 80.0–100.0)
MONO ABS: 0.8 10*3/uL (ref 0.2–0.9)
Monocytes Relative: 9 %
Neutro Abs: 4.4 10*3/uL (ref 1.4–6.5)
Neutrophils Relative %: 50 %
PLATELETS: 272 10*3/uL (ref 150–440)
RBC: 5.26 MIL/uL — AB (ref 3.80–5.20)
RDW: 13.6 % (ref 11.5–14.5)
WBC: 8.9 10*3/uL (ref 3.6–11.0)

## 2015-12-07 ENCOUNTER — Inpatient Hospital Stay: Payer: BLUE CROSS/BLUE SHIELD

## 2015-12-08 ENCOUNTER — Inpatient Hospital Stay: Payer: BLUE CROSS/BLUE SHIELD | Attending: Internal Medicine

## 2015-12-08 ENCOUNTER — Inpatient Hospital Stay: Payer: BLUE CROSS/BLUE SHIELD

## 2015-12-08 DIAGNOSIS — D751 Secondary polycythemia: Secondary | ICD-10-CM | POA: Diagnosis not present

## 2015-12-08 LAB — CBC WITH DIFFERENTIAL/PLATELET
BASOS PCT: 1 %
Basophils Absolute: 0.1 10*3/uL (ref 0–0.1)
EOS ABS: 0.4 10*3/uL (ref 0–0.7)
EOS PCT: 5 %
HCT: 44.6 % (ref 35.0–47.0)
HEMOGLOBIN: 15.2 g/dL (ref 12.0–16.0)
LYMPHS ABS: 2.8 10*3/uL (ref 1.0–3.6)
Lymphocytes Relative: 29 %
MCH: 28.7 pg (ref 26.0–34.0)
MCHC: 34 g/dL (ref 32.0–36.0)
MCV: 84.3 fL (ref 80.0–100.0)
MONO ABS: 0.9 10*3/uL (ref 0.2–0.9)
MONOS PCT: 10 %
NEUTROS PCT: 55 %
Neutro Abs: 5.2 10*3/uL (ref 1.4–6.5)
PLATELETS: 281 10*3/uL (ref 150–440)
RBC: 5.3 MIL/uL — ABNORMAL HIGH (ref 3.80–5.20)
RDW: 13.6 % (ref 11.5–14.5)
WBC: 9.5 10*3/uL (ref 3.6–11.0)

## 2016-02-01 ENCOUNTER — Other Ambulatory Visit: Payer: No Typology Code available for payment source

## 2016-02-01 ENCOUNTER — Ambulatory Visit: Payer: No Typology Code available for payment source | Admitting: Internal Medicine

## 2016-02-02 ENCOUNTER — Other Ambulatory Visit: Payer: Self-pay | Admitting: *Deleted

## 2016-02-02 DIAGNOSIS — D751 Secondary polycythemia: Secondary | ICD-10-CM

## 2016-02-04 ENCOUNTER — Inpatient Hospital Stay: Payer: BLUE CROSS/BLUE SHIELD | Attending: Internal Medicine

## 2016-02-04 ENCOUNTER — Inpatient Hospital Stay: Payer: BLUE CROSS/BLUE SHIELD

## 2016-02-04 ENCOUNTER — Inpatient Hospital Stay (HOSPITAL_BASED_OUTPATIENT_CLINIC_OR_DEPARTMENT_OTHER): Payer: BLUE CROSS/BLUE SHIELD | Admitting: Internal Medicine

## 2016-02-04 VITALS — BP 100/70 | HR 79 | Temp 98.1°F | Resp 18 | Wt 113.5 lb

## 2016-02-04 DIAGNOSIS — D72819 Decreased white blood cell count, unspecified: Secondary | ICD-10-CM | POA: Diagnosis not present

## 2016-02-04 DIAGNOSIS — K219 Gastro-esophageal reflux disease without esophagitis: Secondary | ICD-10-CM | POA: Insufficient documentation

## 2016-02-04 DIAGNOSIS — Z801 Family history of malignant neoplasm of trachea, bronchus and lung: Secondary | ICD-10-CM | POA: Insufficient documentation

## 2016-02-04 DIAGNOSIS — Z808 Family history of malignant neoplasm of other organs or systems: Secondary | ICD-10-CM

## 2016-02-04 DIAGNOSIS — J449 Chronic obstructive pulmonary disease, unspecified: Secondary | ICD-10-CM

## 2016-02-04 DIAGNOSIS — E538 Deficiency of other specified B group vitamins: Secondary | ICD-10-CM | POA: Insufficient documentation

## 2016-02-04 DIAGNOSIS — Z7982 Long term (current) use of aspirin: Secondary | ICD-10-CM

## 2016-02-04 DIAGNOSIS — F17210 Nicotine dependence, cigarettes, uncomplicated: Secondary | ICD-10-CM | POA: Insufficient documentation

## 2016-02-04 DIAGNOSIS — Z79899 Other long term (current) drug therapy: Secondary | ICD-10-CM

## 2016-02-04 DIAGNOSIS — F17200 Nicotine dependence, unspecified, uncomplicated: Secondary | ICD-10-CM

## 2016-02-04 DIAGNOSIS — Z122 Encounter for screening for malignant neoplasm of respiratory organs: Secondary | ICD-10-CM | POA: Insufficient documentation

## 2016-02-04 DIAGNOSIS — Z803 Family history of malignant neoplasm of breast: Secondary | ICD-10-CM | POA: Diagnosis not present

## 2016-02-04 DIAGNOSIS — D751 Secondary polycythemia: Secondary | ICD-10-CM | POA: Diagnosis not present

## 2016-02-04 LAB — CBC
HCT: 44 % (ref 35.0–47.0)
Hemoglobin: 15 g/dL (ref 12.0–16.0)
MCH: 28.6 pg (ref 26.0–34.0)
MCHC: 34.1 g/dL (ref 32.0–36.0)
MCV: 83.9 fL (ref 80.0–100.0)
PLATELETS: 294 10*3/uL (ref 150–440)
RBC: 5.24 MIL/uL — AB (ref 3.80–5.20)
RDW: 14.1 % (ref 11.5–14.5)
WBC: 9.7 10*3/uL (ref 3.6–11.0)

## 2016-02-04 NOTE — Assessment & Plan Note (Signed)
Discussed the potential medical problems risk of malignancy with smoking. Patient not interested

## 2016-02-04 NOTE — Assessment & Plan Note (Addendum)
Secondary erythrocytosis when necessary phlebotomy- if hematocrit greater than 46. Patient does not feel any different pre-or post phlebotomy. Recommend spacing out her H&H check/phlebotomy every 3 months. Patient is agreeable.

## 2016-02-04 NOTE — Assessment & Plan Note (Addendum)
Discussed lung cancer screening given the long history of smoking pros and cons. We will refer her to lung cancer screening program. She is interested.

## 2016-02-04 NOTE — Progress Notes (Signed)
[facility] OFFICE PROGRESS NOTE  Patient Care Team: Leotis Shames, MD as PCP - General (Internal Medicine)  No matching staging information was found for the patient.    No history exists.   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 Sherrlyn Hock.  I reviewed the patient's prior charts/pertinent labs/imaging in detail; findings are summarized above.   A pleasant 55 year old female patient with above history of erythrocytosis related to smoking is here for follow-up. Patient unfortunately continues to smoke.  Denies any unusual weight gain or weight loss. Denies any unusual headaches or vision changes or double vision. No unusual cough or shortness of breath. She has chronic shortness of breath. No hemoptysis.    REVIEW OF SYSTEMS:  A complete 10 point review of system is done which is negative except mentioned above/history of present illness.   PAST MEDICAL HISTORY :  Past Medical History  Diagnosis Date  . Erythrocytosis   . Leukocytosis   . COPD (chronic obstructive pulmonary disease) (HCC)   . GERD (gastroesophageal reflux disease)     PAST SURGICAL HISTORY :   Past Surgical History  Procedure Laterality Date  . Cholecystectomy    . Abdominal hysterectomy    . Tonsillectomy      FAMILY HISTORY :   Family History  Problem Relation Age of Onset  . Brain cancer Mother   . Lung cancer Father   . Lymphoma Sister   . Breast cancer Sister     SOCIAL HISTORY:   Social History  Substance Use Topics  . Smoking status: Current Every Day Smoker -- 0.50 packs/day for 30 years    Types: Cigarettes  . Smokeless tobacco: Current User  . Alcohol Use: No    ALLERGIES:  is allergic to guaifenesin.  MEDICATIONS:  Current Outpatient Prescriptions  Medication Sig Dispense Refill  . aspirin EC 325 MG tablet Take  325 mg by mouth daily as needed. For pain.    . clindamycin (CLEOCIN) 150 MG capsule Take 450 mg by mouth.    . Cyanocobalamin (RA VITAMIN B-12 TR) 1000 MCG TBCR Take 1,000 mcg by mouth daily as needed. For health.    . ibuprofen (ADVIL,MOTRIN) 600 MG tablet Take 1 tablet (600 mg total) by mouth every 8 (eight) hours as needed. 15 tablet 0   No current facility-administered medications for this visit.    PHYSICAL EXAMINATION:   BP 100/70 mmHg  Pulse 79  Temp(Src) 98.1 F (36.7 C) (Tympanic)  Resp 18  Wt 113 lb 8.6 oz (51.5 kg)  Filed Weights   02/04/16 1402  Weight: 113 lb 8.6 oz (51.5 kg)    GENERAL: Thin built moderately nourished female patient alone. Alert, no distress and comfortable.    EYES: no pallor or icterus OROPHARYNX: no thrush or ulceration; poor dentition. NECK: supple, no masses felt LYMPH:  no palpable lymphadenopathy in the cervical, axillary or inguinal regions LUNGS: Decreased air entry. and  No wheeze or crackles HEART/CVS: regular rate & rhythm and no murmurs; No lower extremity edema ABDOMEN:abdomen soft, non-tender and normal bowel sounds Musculoskeletal:no cyanosis of digits and no clubbing  PSYCH: alert & oriented x 3 with fluent speech NEURO: no focal motor/sensory deficits SKIN:  no rashes or significant lesions  LABORATORY DATA:  I have reviewed the data as listed    Component Value Date/Time   NA 144 01/27/2015 2132  K 3.5 01/27/2015 2132   CL 108 01/27/2015 2132   CO2 27 01/27/2015 2132   GLUCOSE 90 01/27/2015 2132   BUN 9 01/27/2015 2132   CREATININE 0.70 01/27/2015 2132   CALCIUM 8.9 01/27/2015 2132   PROT 6.7 01/27/2015 2132   ALBUMIN 3.8 01/27/2015 2132   AST 20 01/27/2015 2132   ALT 8* 01/27/2015 2132   ALKPHOS 100 01/27/2015 2132   BILITOT 0.4 01/27/2015 2132   GFRNONAA >60 01/27/2015 2132   GFRAA >60 01/27/2015 2132    No results found for: SPEP, UPEP  Lab Results  Component Value Date   WBC 9.7 02/04/2016    NEUTROABS 5.2 12/08/2015   HGB 15.0 02/04/2016   HCT 44.0 02/04/2016   MCV 83.9 02/04/2016   PLT 294 02/04/2016      Chemistry      Component Value Date/Time   NA 144 01/27/2015 2132   K 3.5 01/27/2015 2132   CL 108 01/27/2015 2132   CO2 27 01/27/2015 2132   BUN 9 01/27/2015 2132   CREATININE 0.70 01/27/2015 2132      Component Value Date/Time   CALCIUM 8.9 01/27/2015 2132   ALKPHOS 100 01/27/2015 2132   AST 20 01/27/2015 2132   ALT 8* 01/27/2015 2132   BILITOT 0.4 01/27/2015 2132       RADIOGRAPHIC STUDIES: I have personally reviewed the radiological images as listed and agreed with the findings in the report. No results found.   ASSESSMENT & PLAN:  Screening for malignant neoplasm of respiratory organ Discussed lung cancer screening given the long history of smoking pros and cons. We will refer her to lung cancer screening program. She is interested.  Secondary erythrocytosis Secondary erythrocytosis when necessary phlebotomy- if hematocrit greater than 46. Patient does not feel any different pre-or post phlebotomy. Recommend spacing out her H&H check/phlebotomy every 3 months. Patient is agreeable.  Smoking Discussed the potential medical problems risk of malignancy with smoking. Patient not interested    Orders Placed This Encounter  Procedures  . Hemoglobin [facility])    Standing Status: Standing     Number of Occurrences: 3     Standing Expiration Date: 02/03/2017  . Hematocrit ([facility])    Standing Status: Standing     Number of Occurrences: 3     Standing Expiration Date: 02/03/2017   All questions were answered. The patient knows to call the clinic with any problems, questions or concerns. Follow-up with me in 6 months; H&H phlebotomy every 3 months.     Earna CoderGovinda R Brahmanday, MD 02/04/2016 2:24 PM

## 2016-05-05 ENCOUNTER — Inpatient Hospital Stay: Payer: Self-pay | Attending: Internal Medicine

## 2016-05-05 ENCOUNTER — Inpatient Hospital Stay: Payer: BLUE CROSS/BLUE SHIELD

## 2016-05-05 DIAGNOSIS — D751 Secondary polycythemia: Secondary | ICD-10-CM | POA: Insufficient documentation

## 2016-05-05 LAB — HEMATOCRIT: HCT: 42.5 % (ref 35.0–47.0)

## 2016-05-05 LAB — HEMOGLOBIN: Hemoglobin: 14.4 g/dL (ref 12.0–16.0)

## 2016-06-02 ENCOUNTER — Emergency Department
Admission: EM | Admit: 2016-06-02 | Discharge: 2016-06-02 | Disposition: A | Discharge status: Home / Self Care | Payer: Self-pay | Attending: Emergency Medicine | Admitting: Emergency Medicine

## 2016-06-02 ENCOUNTER — Emergency Department: Payer: Self-pay

## 2016-06-02 ENCOUNTER — Encounter: Payer: Self-pay | Admitting: Emergency Medicine

## 2016-06-02 DIAGNOSIS — F17210 Nicotine dependence, cigarettes, uncomplicated: Secondary | ICD-10-CM | POA: Insufficient documentation

## 2016-06-02 DIAGNOSIS — M545 Low back pain, unspecified: Secondary | ICD-10-CM

## 2016-06-02 DIAGNOSIS — R0789 Other chest pain: Secondary | ICD-10-CM | POA: Insufficient documentation

## 2016-06-02 DIAGNOSIS — J449 Chronic obstructive pulmonary disease, unspecified: Secondary | ICD-10-CM | POA: Insufficient documentation

## 2016-06-02 DIAGNOSIS — Z7982 Long term (current) use of aspirin: Secondary | ICD-10-CM | POA: Insufficient documentation

## 2016-06-02 DIAGNOSIS — Z79899 Other long term (current) drug therapy: Secondary | ICD-10-CM | POA: Insufficient documentation

## 2016-06-02 DIAGNOSIS — G43909 Migraine, unspecified, not intractable, without status migrainosus: Secondary | ICD-10-CM | POA: Insufficient documentation

## 2016-06-02 LAB — CBC WITH DIFFERENTIAL/PLATELET
BASOS ABS: 0.1 10*3/uL (ref 0–0.1)
BASOS PCT: 1 %
EOS ABS: 0.3 10*3/uL (ref 0–0.7)
EOS PCT: 3 %
HCT: 44.5 % (ref 35.0–47.0)
Hemoglobin: 15.1 g/dL (ref 12.0–16.0)
LYMPHS PCT: 22 %
Lymphs Abs: 2.4 10*3/uL (ref 1.0–3.6)
MCH: 29.3 pg (ref 26.0–34.0)
MCHC: 34 g/dL (ref 32.0–36.0)
MCV: 86.3 fL (ref 80.0–100.0)
MONO ABS: 1.2 10*3/uL — AB (ref 0.2–0.9)
Monocytes Relative: 11 %
Neutro Abs: 7 10*3/uL — ABNORMAL HIGH (ref 1.4–6.5)
Neutrophils Relative %: 63 %
PLATELETS: 217 10*3/uL (ref 150–440)
RBC: 5.16 MIL/uL (ref 3.80–5.20)
RDW: 13.9 % (ref 11.5–14.5)
WBC: 11 10*3/uL (ref 3.6–11.0)

## 2016-06-02 LAB — TROPONIN I: Troponin I: <0.03 ng/mL (ref <0.03)

## 2016-06-02 LAB — BASIC METABOLIC PANEL
ANION GAP: 6 (ref 5–15)
BUN: 8 mg/dL (ref 6–20)
CO2: 28 mmol/L (ref 22–32)
CREATININE: 0.72 mg/dL (ref 0.44–1.00)
Calcium: 8.9 mg/dL (ref 8.9–10.3)
Chloride: 106 mmol/L (ref 101–111)
GFR calc Af Amer: >60 mL/min (ref >60)
GFR calc non Af Amer: >60 mL/min (ref >60)
Glucose, Bld: 99 mg/dL (ref 65–99)
POTASSIUM: 3.6 mmol/L (ref 3.5–5.1)
Sodium: 140 mmol/L (ref 135–145)

## 2016-06-02 LAB — URINALYSIS COMPLETE WITH MICROSCOPIC (ARMC ONLY)
Bilirubin Urine: NEGATIVE
Glucose, UA: NEGATIVE mg/dL
Nitrite: NEGATIVE
PH: 7 (ref 5.0–8.0)
PROTEIN: NEGATIVE mg/dL
Specific Gravity, Urine: 1.006 (ref 1.005–1.030)

## 2016-06-02 MED ORDER — METOCLOPRAMIDE HCL 5 MG/ML IJ SOLN
10.0000 mg | Freq: Once | INTRAMUSCULAR | Status: AC
  Administered 2016-06-02: 10 mg via INTRAVENOUS
  Filled 2016-06-02: qty 2

## 2016-06-02 MED ORDER — PREDNISONE 20 MG PO TABS: 40.0000 mg | ORAL_TABLET | Freq: Every day | ORAL | 0 refills | Status: AC

## 2016-06-02 MED ORDER — NAPROXEN 500 MG PO TABS: 500.0000 mg | ORAL_TABLET | Freq: Two times a day (BID) | ORAL | 0 refills | Status: AC

## 2016-06-02 MED ORDER — DEXAMETHASONE SODIUM PHOSPHATE 10 MG/ML IJ SOLN
10.0000 mg | Freq: Once | INTRAMUSCULAR | Status: AC
  Administered 2016-06-02: 10 mg via INTRAVENOUS
  Filled 2016-06-02: qty 1

## 2016-06-02 MED ORDER — KETOROLAC TROMETHAMINE 30 MG/ML IJ SOLN
30.0000 mg | Freq: Once | INTRAMUSCULAR | Status: AC
  Administered 2016-06-02: 30 mg via INTRAVENOUS
  Filled 2016-06-02: qty 1
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
## 2016-06-02 NOTE — Discharge Instructions (Signed)
You have been seen in the Emergency Department (ED)  today for back pain.  Back pain has many possible causes some are related to muscles while others have more serious causes. Even though you were checked carefully today and your exam and evaluation were reassuring, problems may develop later or continue to unfold. Therefore it is imperative that you follow up with doctor closely for further evaluation.  Follow-up with your doctor in 1 day for further evaluation.  For pain control take: naprosyn 500mg  twice a day with meals and prednisone 40mg  for the 5 days.  When should you call for help?  Call your doctor now or seek immediate medical care if:  You have new or worsening numbness in your legs.  You have new or worsening weakness in your legs. (This could make it hard to stand up.)  You lose control of your bladder or bowels or if you are unable to urinate. You have numbness of your groin or buttock region If you develop a fever  Watch closely for changes in your health, and be sure to contact your doctor if:  Your pain gets worse.  You are not getting better after 2 weeks.  How can you care for yourself at home?  Take pain medicines exactly as directed.  If the doctor gave you a prescription medicine for pain, take it as prescribed.  If you are not taking a prescription pain medicine, ask your doctor if you can take an over-the-counter medicine like tylenol or ibuprofen. Sit or lie in positions that are most comfortable and reduce your pain. Try one of these positions when you lie down:  Lie on your back with your knees bent and supported by large pillows.  Lie on the floor with your legs on the seat of a sofa or chair.  Lie on your side with your knees and hips bent and a pillow between your legs.  Lie on your stomach if it does not make pain worse. Do not sit up in bed, and avoid soft couches and twisted positions. Bed rest can help relieve pain at first, but it delays healing. Avoid  bed rest after the first day of back pain.  Change positions every 30 minutes. If you must sit for long periods of time, take breaks from sitting. Get up and walk around, or lie in a comfortable position.  Try using a heating pad on a low or medium setting for 15 to 20 minutes every 2 or 3 hours. Try a warm shower in place of one session with the heating pad.  You can also try an ice pack for 10 to 15 minutes every 2 to 3 hours. Put a thin cloth between the ice pack and your skin.  Take short walks several times a day. You can start with 5 to 10 minutes, 3 or 4 times a day, and work up to longer walks. Walk on level surfaces and avoid hills and stairs until your back is better.  Return to work and other activities as soon as you can. Continued rest without activity is usually not good for your back.  To prevent future back pain, do exercises to stretch and strengthen your back and stomach. Learn how to use good posture, safe lifting techniques, and proper body mechanics.

## 2016-06-02 NOTE — ED Notes (Signed)
Post residual urine= 

## 2016-06-02 NOTE — ED Triage Notes (Signed)
Patient ambulatory to triage with steady gait, without difficulty or distress noted; pt reports frontal HA since yesterday; mid back pain radiating down legs tonight with no known injury

## 2016-06-03 LAB — URINE CULTURE

## 2016-08-04 ENCOUNTER — Ambulatory Visit: Payer: BLUE CROSS/BLUE SHIELD | Admitting: Internal Medicine

## 2016-08-04 ENCOUNTER — Other Ambulatory Visit: Payer: BLUE CROSS/BLUE SHIELD

## 2016-12-23 ENCOUNTER — Emergency Department
Admission: EM | Admit: 2016-12-23 | Discharge: 2016-12-23 | Disposition: A | Discharge status: Home / Self Care | Payer: Self-pay | Attending: Emergency Medicine | Admitting: Emergency Medicine

## 2016-12-23 ENCOUNTER — Encounter: Payer: Self-pay | Admitting: Emergency Medicine

## 2016-12-23 ENCOUNTER — Emergency Department: Payer: Self-pay

## 2016-12-23 DIAGNOSIS — X58XXXA Exposure to other specified factors, initial encounter: Secondary | ICD-10-CM | POA: Insufficient documentation

## 2016-12-23 DIAGNOSIS — Y999 Unspecified external cause status: Secondary | ICD-10-CM | POA: Insufficient documentation

## 2016-12-23 DIAGNOSIS — S161XXA Strain of muscle, fascia and tendon at neck level, initial encounter: Secondary | ICD-10-CM | POA: Insufficient documentation

## 2016-12-23 DIAGNOSIS — Y939 Activity, unspecified: Secondary | ICD-10-CM | POA: Insufficient documentation

## 2016-12-23 DIAGNOSIS — F17210 Nicotine dependence, cigarettes, uncomplicated: Secondary | ICD-10-CM | POA: Insufficient documentation

## 2016-12-23 DIAGNOSIS — Z7982 Long term (current) use of aspirin: Secondary | ICD-10-CM | POA: Insufficient documentation

## 2016-12-23 DIAGNOSIS — R55 Syncope and collapse: Secondary | ICD-10-CM | POA: Insufficient documentation

## 2016-12-23 DIAGNOSIS — Z79899 Other long term (current) drug therapy: Secondary | ICD-10-CM | POA: Insufficient documentation

## 2016-12-23 DIAGNOSIS — J449 Chronic obstructive pulmonary disease, unspecified: Secondary | ICD-10-CM | POA: Insufficient documentation

## 2016-12-23 DIAGNOSIS — R634 Abnormal weight loss: Secondary | ICD-10-CM | POA: Insufficient documentation

## 2016-12-23 DIAGNOSIS — Y929 Unspecified place or not applicable: Secondary | ICD-10-CM | POA: Insufficient documentation

## 2016-12-23 LAB — BASIC METABOLIC PANEL
ANION GAP: 8 (ref 5–15)
BUN: 10 mg/dL (ref 6–20)
CHLORIDE: 106 mmol/L (ref 101–111)
CO2: 26 mmol/L (ref 22–32)
Calcium: 8.9 mg/dL (ref 8.9–10.3)
Creatinine, Ser: 0.71 mg/dL (ref 0.44–1.00)
GFR calc non Af Amer: >60 mL/min (ref >60)
Glucose, Bld: 85 mg/dL (ref 65–99)
POTASSIUM: 4 mmol/L (ref 3.5–5.1)
Sodium: 140 mmol/L (ref 135–145)

## 2016-12-23 LAB — URINALYSIS, COMPLETE (UACMP) WITH MICROSCOPIC
Bacteria, UA: NONE SEEN
Bilirubin Urine: NEGATIVE
Glucose, UA: NEGATIVE mg/dL
KETONES UR: NEGATIVE mg/dL
Leukocytes, UA: NEGATIVE
Nitrite: NEGATIVE
PROTEIN: NEGATIVE mg/dL
SQUAMOUS EPITHELIAL / LPF: NONE SEEN
Specific Gravity, Urine: 1 — ABNORMAL LOW (ref 1.005–1.030)
pH: 7 (ref 5.0–8.0)

## 2016-12-23 LAB — CBC
HCT: 41.3 % (ref 35.0–47.0)
HEMOGLOBIN: 14.3 g/dL (ref 12.0–16.0)
MCH: 29.1 pg (ref 26.0–34.0)
MCHC: 34.7 g/dL (ref 32.0–36.0)
MCV: 83.9 fL (ref 80.0–100.0)
Platelets: 275 10*3/uL (ref 150–440)
RBC: 4.92 MIL/uL (ref 3.80–5.20)
RDW: 13.5 % (ref 11.5–14.5)
WBC: 10.1 10*3/uL (ref 3.6–11.0)

## 2016-12-23 MED ORDER — CYCLOBENZAPRINE HCL 10 MG PO TABS: 10.0000 mg | ORAL_TABLET | Freq: Three times a day (TID) | ORAL | 0 refills | Status: DC | PRN

## 2016-12-23 NOTE — ED Notes (Signed)
Pt returned from radiology.

## 2016-12-23 NOTE — ED Notes (Signed)

## 2016-12-23 NOTE — ED Notes (Signed)
See triage note.  Pt is in nad and ambulates to room without difficulty.  Denies any pain or other symptoms.  Just losing weight.  Says eating normally.  Also has felt faint since yesterday.  No other sx.  Has pcp, but it takes months to get in for appt.

## 2016-12-23 NOTE — ED Provider Notes (Signed)
[facility] Emergency Department Provider Note   ____________________________________________    I have reviewed the triage vital signs and the nursing notes.   HISTORY  Chief Complaint Weakness; Weight Loss; and Neck Pain     HPI Molly Banks is a 56 y.o. female who presents with complaints of dizziness and weight loss. She also complains of neck pain which is been occurring for over a month. Patient reports over the last several days she has had several episodes where she has gotten abruptly lightheaded. This is not the first time this has happened she reports she has had this for years. She denies chest pain or palpitations. No shortness of breath. No pleurisy. No nausea or vomiting. She feels well currently and has no complaints. Today it happened while she was at work, she had been on her feet for some time but she reports there is no pattern to when it happens usually. She sat down and had something to eat and it got better. She reports she stays hydrated. She also complains of right-sided neck pain which is worse with movement of her right arm, the pain radiates to her right elbow but no further. She has been taking ibuprofen which seems to help    Past Medical History:  Diagnosis Date  . COPD (chronic obstructive pulmonary disease) (HCC)   . Erythrocytosis   . GERD (gastroesophageal reflux disease)   . Leukocytosis     Patient Active Problem List   Diagnosis Date Noted  . Smoking 02/04/2016  . Screening for malignant neoplasm of respiratory organ 02/04/2016  . B12 deficiency 02/04/2016  . 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 ABDOMINAL HYSTERECTOMY    . CHOLECYSTECTOMY    . TONSILLECTOMY      Prior to Admission medications   Medication Sig Start Date End Date Taking? Authorizing Provider  aspirin EC 325 MG tablet Take 325 mg by mouth daily as needed. For pain.    Historical  Provider, MD  Cyanocobalamin (RA VITAMIN B-12 TR) 1000 MCG TBCR Take 1,000 mcg by mouth daily as needed. For health.    Historical Provider, MD  cyclobenzaprine (FLEXERIL) 10 MG tablet Take 1 tablet (10 mg total) by mouth 3 (three) times daily as needed for muscle spasms. 12/23/16   Jene Every, MD  ibuprofen (ADVIL,MOTRIN) 600 MG tablet Take 1 tablet (600 mg total) by mouth every 8 (eight) hours as needed. 10/10/15   Arnaldo Natal, MD     Allergies Guaifenesin  Family History  Problem Relation Age of Onset  . Brain cancer Mother   . Lung cancer Father   . Lymphoma Sister   . Breast cancer Sister     Social History Social History  Substance Use Topics  . Smoking status: Current Every Day Smoker    Packs/day: 0.50    Years: 30.00    Types: Cigarettes  . Smokeless tobacco: Former Neurosurgeon  . Alcohol use No    Review of Systems  Constitutional: No fever/chills Eyes: No visual changes.  ENT: No sore throat. Cardiovascular: Denies chest pain. Respiratory: Denies shortness of breath. Gastrointestinal: No abdominal pain.  No nausea, no vomiting.   Genitourinary: Negative for dysuria. Musculoskeletal: Right neck pain as above Skin: Negative for rash. Neurological: Negative for headaches or weakness   ____________________________________________   PHYSICAL EXAM:  VITAL SIGNS: ED Triage Vitals  Enc Vitals Group     BP 12/23/16 1251 118/63  Pulse Rate 12/23/16 1251 74     Resp 12/23/16 1251 14     Temp 12/23/16 1251 97 F (36.1 C)     Temp Source 12/23/16 1251 Oral     SpO2 12/23/16 1251 98 %     Weight 12/23/16 1252 112 lb (50.8 kg)     Height 12/23/16 1252 5\' 1"  (1.549 m)     Head Circumference --      Peak Flow --      Pain Score 12/23/16 1243 6     Pain Loc --      Pain Edu? --      Excl. in GC? --     Constitutional: Alert and oriented. No acute distress. Pleasant and interactive Eyes: Conjunctivae are normal.  Head: Atraumatic. Nose: No  congestion/rhinnorhea. Mouth/Throat: Mucous membranes are moist.   Neck: No vertebral tenderness to palpation, tenderness to palpation of the insertion site of the right trapezius muscle. Normal strength in the right upper extremity, 2+ pulses. Cardiovascular: Normal rate, regular rhythm. Grossly normal heart sounds.  Good peripheral circulation. Respiratory: Normal respiratory effort.  No retractions. Lungs CTAB. Gastrointestinal: Soft and nontender. No distention.  No CVA tenderness. Genitourinary: deferred Musculoskeletal: No lower extremity tenderness nor edema.  Warm and well perfused Neurologic:  Normal speech and language. No gross focal neurologic deficits are appreciated.  Skin:  Skin is warm, dry and intact. No rash noted. Psychiatric: Mood and affect are normal. Speech and behavior are normal.  ____________________________________________   LABS (all labs ordered are listed, but only abnormal results are displayed)  Labs Reviewed  URINALYSIS, COMPLETE (UACMP) WITH MICROSCOPIC - Abnormal; Notable for the following:       Result Value   Color, Urine COLORLESS (*)    APPearance CLEAR (*)    Specific Gravity, Urine 1.000 (*)    Hgb urine dipstick SMALL (*)    All other components within normal limits  BASIC METABOLIC PANEL  CBC   ____________________________________________  EKG  ED ECG REPORT I, Jene Every, the attending physician, personally viewed and interpreted this ECG.  Date: 12/23/2016 EKG Time: 12:32 PM Rate: 77 Rhythm: normal sinus rhythm QRS Axis: normal Intervals: normal ST/T Wave abnormalities: normal Conduction Disturbances: none Narrative Interpretation: unremarkable  ____________________________________________  RADIOLOGY  Chest x-[place] ____________________________________________   PROCEDURES  Procedure(s) performed: No    Critical Care performed: No ____________________________________________   INITIAL IMPRESSION / ASSESSMENT  AND PLAN / ED COURSE  Pertinent labs & imaging results that were available during my care of the patient were reviewed by me and considered in my medical decision making (see chart for details).  Patient presents after a near syncopal episode. She is well-appearing and has no complaints currently. EKG is normal. No chest pain or palpitations. Lab work is unremarkable. Unclear etiology of her syncopal episode. In addition she also describes 20 pound weight loss over the last month. Given her history of smoking screening x-Tena performed    ____________________________________________   FINAL CLINICAL IMPRESSION(S) / ED DIAGNOSES  Final diagnoses:  Near syncope  Weight loss  Acute strain of neck muscle, initial encounter      NEW MEDICATIONS STARTED DURING THIS VISIT:  Discharge Medication List as of 12/23/2016  3:57 PM    START taking these medications   Details  cyclobenzaprine (FLEXERIL) 10 MG tablet Take 1 tablet (10 mg total) by mouth 3 (three) times daily as needed for muscle spasms., Starting Fri 12/23/2016, Print  Note:  This document was prepared using Dragon voice recognition software and may include unintentional dictation errors.    Jene Everyobert Conny Moening, MD 12/23/16 [phone]

## 2016-12-23 NOTE — ED Triage Notes (Signed)
Says she has been feeling like she is going to pass out since yesterday.  Has pain right side neck and arm, but that's been going on for a long time--just worsening.  Also says she has lost 20 pounds in last month

## 2017-01-08 ENCOUNTER — Ambulatory Visit
Admission: EM | Admit: 2017-01-08 | Discharge: 2017-01-08 | Disposition: A | Discharge status: Home / Self Care | Payer: Worker's Compensation | Attending: Family Medicine | Admitting: Family Medicine

## 2017-01-08 ENCOUNTER — Telehealth: Payer: Self-pay

## 2017-01-08 DIAGNOSIS — Z0283 Encounter for blood-alcohol and blood-drug test: Secondary | ICD-10-CM

## 2017-01-08 DIAGNOSIS — S46101A Unspecified injury of muscle, fascia and tendon of long head of biceps, right arm, initial encounter: Secondary | ICD-10-CM

## 2017-01-08 DIAGNOSIS — S161XXA Strain of muscle, fascia and tendon at neck level, initial encounter: Secondary | ICD-10-CM

## 2017-01-08 MED ORDER — METAXALONE 800 MG PO TABS: 800.0000 mg | ORAL_TABLET | Freq: Three times a day (TID) | ORAL | 0 refills | Status: DC

## 2017-01-08 MED ORDER — MELOXICAM 15 MG PO TABS: 15.0000 mg | ORAL_TABLET | Freq: Every day | ORAL | 0 refills | Status: DC

## 2017-01-08 NOTE — ED Triage Notes (Signed)
Patient states that she pulling a piece of equipment while at work this morning around 730am. Patient states that she felt something pull in her right shoulder and has had pain since.

## 2017-01-08 NOTE — Discharge Instructions (Signed)
Use ice to your neck and shoulder 20 minutes out of every 2 hours 4 times daily. Do not use the muscle relaxant, Skelaxin, during activities requiring concentration or judgment. Do not drive while taking the medication. Follow up with Glenard Haringommy Moore, FNP at [facility] occupational health in one week

## 2017-01-08 NOTE — ED Provider Notes (Signed)
CSN: [id number]     Arrival date & time 01/08/17  0845 History   First MD Initiated Contact with Patient 01/08/17 0913     Chief Complaint  Patient presents with  . Work Related Injury   (Consider location/radiation/quality/duration/timing/severity/associated sxs/prior Treatment) HPI   This a 56 year old female who was injured at work this morning. She states that she was pulling a piece of equipment that had been inadvertently double stacked she felt a pull in her trapezial area and pain in her right shoulder. He states that she has had the pain ever since the incident. She does not have any radicular pain below the level of her deltoid. Complain that it hurts to raise her arm. Her neck is also very sore to any motion.       Past Medical History:  Diagnosis Date  . COPD (chronic obstructive pulmonary disease) (HCC)   . Erythrocytosis   . GERD (gastroesophageal reflux disease)   . Leukocytosis    Past Surgical History:  Procedure Laterality Date  . ABDOMINAL HYSTERECTOMY    . CHOLECYSTECTOMY    . TONSILLECTOMY     Family History  Problem Relation Age of Onset  . Brain cancer Mother   . Lung cancer Father   . Lymphoma Sister   . Breast cancer Sister    Social History  Substance Use Topics  . Smoking status: Current Every Day Smoker    Packs/day: 0.50    Years: 30.00    Types: Cigarettes  . Smokeless tobacco: Former Neurosurgeon  . Alcohol use No   OB History    No data available     Review of Systems  Constitutional: Positive for activity change. Negative for appetite change, chills, fatigue and fever.  Musculoskeletal: Positive for myalgias, neck pain and neck stiffness.  All other systems reviewed and are negative.   Allergies  Guaifenesin  Home Medications   Prior to Admission medications   Medication Sig Start Date End Date Taking? Authorizing Provider  aspirin EC 325 MG tablet Take 325 mg by mouth daily as needed. For pain.   Yes [provider]   Cyanocobalamin (RA VITAMIN B-12 TR) 1000 MCG TBCR Take 1,000 mcg by mouth daily as needed. For health.   Yes [provider]  ibuprofen (ADVIL,MOTRIN) 600 MG tablet Take 1 tablet (600 mg total) by mouth every 8 (eight) hours as needed. 10/10/15  Yes Arnaldo Natal, MD  meloxicam (MOBIC) 15 MG tablet Take 1 tablet (15 mg total) by mouth daily. 01/08/17   Lutricia Feil, PA-C  metaxalone (SKELAXIN) 800 MG tablet Take 1 tablet (800 mg total) by mouth 3 (three) times daily. 01/08/17   Lutricia Feil, PA-C   Meds Ordered and Administered this Visit  Medications - No data to display  BP 112/75 (BP Location: Left Arm)   Pulse 81   Temp 97.7 F (36.5 C) (Oral)   Resp 17   Ht 5\' 1"  (1.549 m)   Wt 112 lb (50.8 kg)   SpO2 99%   BMI 21.16 kg/m  No data found.   Physical Exam  Constitutional: She is oriented to person, place, and time. She appears well-developed and well-nourished. No distress.  HENT:  Head: Normocephalic.  Eyes: EOM are normal. Pupils are equal, round, and reactive to light. Right eye exhibits no discharge. Left eye exhibits no discharge.  Neck:  Examination of the cervical spine shows decreased range of motion particularly to rotation left and right. Extension Is also uncomfortable.  Upper extremity sensation is intact throughout. Upper extremity strength is intact. Patient has tight tender trapezius on the right. So has pain to palpation along the paraspinous muscles on the right. Shoulder shows good range of motion actual passive and active. She has a negative arm drop test. She is a negative empty can test. He does have significant tenderness over the coracoid process.  Musculoskeletal: Normal range of motion. She exhibits tenderness.  Referred to cervical spine/neck examination  Neurological: She is alert and oriented to person, place, and time.  Skin: Skin is warm and dry. She is not diaphoretic.  Psychiatric: She has a normal mood and affect. Her behavior is  normal. Judgment and thought content normal.  Nursing note and vitals reviewed.   Urgent Care Course     Procedures (including critical care time)  Labs Review Labs Reviewed - No data to display  Imaging Review No results found.   Visual Acuity Review  Right Eye Distance:   Left Eye Distance:   Bilateral Distance:    Right Eye Near:   Left Eye Near:    Bilateral Near:         MDM   1. Cervical strain, acute, initial encounter   2. Injury of tendon of long head of right biceps, initial encounter    Discharge Medication List as of 01/08/2017  9:47 AM     Discharge Medication List as of 01/08/2017  9:47 AM    Plan: 1. Test/x-[place] results and diagnosis reviewed with patient 2. rx as per orders; risks, benefits, potential side effects reviewed with patient 3. Recommend supportive treatment with rest and symptom avoidance. Use ice to neck and shoulder 20 minutes out of every 2 hours 4 times daily for comfort. Induration with the use of Skelaxin requiring activities require concentration and judgment. Do not drive while taking Skelaxin. Follow up in one week with Glenard Haringommy Moore FNP at [facility] occupational [facility]. Light duty restrictions were provided to the patient. He wishes very badly to return to work today. 4. F/u prn if symptoms worsen or don't improve     Lutricia FeilRoemer, William P, PA-C 01/08/17 1010

## 2017-01-08 NOTE — ED Notes (Signed)
Patient came in today as a workers comp injury- Profile states that employee will bring a COC for drug screening. Patient reports that she was not given any paperwork. Patient provided number for Supervisor- Crystal [phone]. I have attempted to call 3 times without answer and no VM set up. Will obtain UDS using our COC for [facility].

## 2017-03-30 ENCOUNTER — Ambulatory Visit (INDEPENDENT_AMBULATORY_CARE_PROVIDER_SITE_OTHER): Payer: BLUE CROSS/BLUE SHIELD | Admitting: Nurse Practitioner

## 2017-03-30 ENCOUNTER — Encounter: Payer: Self-pay | Admitting: Nurse Practitioner

## 2017-03-30 VITALS — BP 102/59 | HR 65 | Temp 97.7°F | Ht 63.0 in | Wt 112.8 lb

## 2017-03-30 DIAGNOSIS — N3941 Urge incontinence: Secondary | ICD-10-CM

## 2017-03-30 DIAGNOSIS — Z720 Tobacco use: Secondary | ICD-10-CM | POA: Diagnosis not present

## 2017-03-30 DIAGNOSIS — Z7689 Persons encountering health services in other specified circumstances: Secondary | ICD-10-CM

## 2017-03-30 DIAGNOSIS — Z716 Tobacco abuse counseling: Secondary | ICD-10-CM

## 2017-03-30 MED ORDER — BUPROPION HCL ER (SR) 150 MG PO TB12
ORAL_TABLET | ORAL | 2 refills | Status: DC
Start: 2017-03-30 — End: 2017-07-25

## 2017-03-30 MED ORDER — OXYBUTYNIN CHLORIDE ER 10 MG PO TB24: 10.0000 mg | ORAL_TABLET | Freq: Every day | ORAL | 5 refills | Status: DC

## 2017-03-30 NOTE — Patient Instructions (Addendum)
Molly Banks, Thank you for coming in to clinic today.  1. For your incontinence: - Likely overactive bladder. - START oxybutynin 24 hour tablet 10 mg once daily.  2. For your smoking cessation: - Start Wellbutrin 150mg  tablets - take 1 tab daily for 3 days - Then, start taking 1 tab TWICE daily (about every 12 hours). Continue this for about 7 weeks. - To quit smoking - wait to start this treatment until you are mentally ready to quit. - Choose a quit date in the future. Start taking the medicine about 1-2 weeks away from your quit date. Reduce the number of cigarettes daily until you eventually QUIT completely on your quit date. Continue taking the Wellbutrin twice daily for total 7 weeks, we may continue for longer.   Please schedule a follow-up appointment with Molly Banks, AGNP. Return in about 6 weeks (around 05/11/2017) for smoking cessation and annual physical at your next convenience.  If you have any other questions or concerns, please feel free to call the clinic or send a message through MyChart. You may also schedule an earlier appointment if necessary.  You will receive a survey after today's visit either digitally by e-mail or paper by [employer]. Your experiences and feedback matter to Molly Banks.  Please respond so we know how we are doing as we provide care for you.   Molly Mcardle, DNP, AGNP-BC Adult Gerontology Nurse Practitioner [facility], [facility]   Overactive Bladder, Adult Overactive bladder is a group of urinary symptoms. With overactive bladder, you may suddenly feel the need to pass urine (urinate) right away. After feeling this sudden urge, you might also leak urine if you cannot get to the bathroom fast enough (urinary incontinence). These symptoms might interfere with your daily work or social activities. Overactive bladder symptoms may also wake you up at night. Overactive bladder affects the nerve signals between your bladder and your brain. Your bladder  may get the signal to empty before it is full. Very sensitive muscles can also make your bladder squeeze too soon. What are the causes? Many things can cause an overactive bladder. Possible causes include:  Urinary tract infection.  Infection of nearby tissues, such as the prostate.  Prostate enlargement.  Being pregnant with twins or more (multiples).  Surgery on the uterus or urethra.  Bladder stones, inflammation, or tumors.  Drinking too much caffeine or alcohol.  Certain medicines, especially those that you take to help your body get rid of extra fluid (diuretics) by increasing urine production.  Muscle or nerve weakness, especially from: ? A spinal cord injury. ? Stroke. ? Multiple sclerosis. ? Parkinson disease.  Diabetes. This can cause a high urine volume that fills the bladder so quickly that the normal urge to urinate is triggered very strongly.  Constipation. A buildup of too much stool can put pressure on your bladder.  What increases the risk? You may be at greater risk for overactive bladder if you:  Are an older adult.  Smoke.  Are going through menopause.  Have prostate problems.  Have a neurological disease, such as stroke, dementia, Parkinson disease, or multiple sclerosis (MS).  Eat or drink things that irritate the bladder. These include alcohol, spicy food, and caffeine.  Are overweight or obese.  What are the signs or symptoms? The signs and symptoms of an overactive bladder include:  Sudden, strong urges to urinate.  Leaking urine.  Urinating eight or more times per day.  Waking up to urinate two or  more times per night.  How is this diagnosed? Your health care provider may suspect overactive bladder based on your symptoms. The health care provider will do a physical exam and take your medical history. Blood or urine tests may also be done. For example, you might need to have a bladder function test to check how well you can hold your  urine. You might also need to see a health care provider who specializes in the urinary tract (urologist). How is this treated? Treatment for overactive bladder depends on the cause of your condition and whether it is mild or severe. Certain treatments can be done in your health care provider's office or clinic. You can also make lifestyle changes at home. Options include: Behavioral Treatments  Biofeedback. A specialist uses sensors to help you become aware of your body's signals.  Keeping a daily log of when you need to urinate and what happens after the urge. This may help you manage your condition.  Bladder training. This helps you learn to control the urge to urinate by following a schedule that directs you to urinate at regular intervals (timed voiding). At first, you might have to wait a few minutes after feeling the urge. In time, you should be able to schedule bathroom visits an hour or more apart.  Kegel exercises. These are exercises to strengthen the pelvic floor muscles, which support the bladder. Toning these muscles can help you control urination, even if your bladder muscles are overactive. A specialist will teach you how to do these exercises correctly. They require daily practice.  Weight loss. If you are obese or overweight, losing weight might relieve your symptoms of overactive bladder. Talk to your health care provider about losing weight and whether there is a specific program or method that would work best for you.  Diet change. This might help if constipation is making your overactive bladder worse. Your health care provider or a dietitian can explain ways to change what you eat to ease constipation. You might also need to consume less alcohol and caffeine or drink other fluids at different times of the day.  Stopping smoking.  Wearing pads to absorb leakage while you wait for other treatments to take effect. Physical Treatments  Electrical stimulation. Electrodes send  gentle pulses of electricity to strengthen the nerves or muscles that help to control the bladder. Sometimes, the electrodes are placed outside of the body. In other cases, they might be placed inside the body (implanted). This treatment can take several months to have an effect.  Supportive devices. Women may need a plastic device that fits into the vagina and supports the bladder (pessary). Medicines Several medicines can help treat overactive bladder and are usually used along with other treatments. Some are injected into the muscles involved in urination. Others come in pill form. Your health care provider may prescribe:  Antispasmodics. These medicines block the signals that the nerves send to the bladder. This keeps the bladder from releasing urine at the wrong time.  Tricyclic antidepressants. These types of antidepressants also relax bladder muscles.  Surgery  You may have a device implanted to help manage the nerve signals that indicate when you need to urinate.  You may have surgery to implant electrodes for electrical stimulation.  Sometimes, very severe cases of overactive bladder require surgery to change the shape of the bladder. Follow these instructions at home:  Take medicines only as directed by your health care provider.  Use any implants or a pessary as  directed by your health care provider.  Make any diet or lifestyle changes that are recommended by your health care provider. These might include: ? Drinking less fluid or drinking at different times of the day. If you need to urinate often during the night, you may need to stop drinking fluids early in the evening. ? Cutting down on caffeine or alcohol. Both can make an overactive bladder worse. Caffeine is found in coffee, tea, and sodas. ? Doing Kegel exercises to strengthen muscles. ? Losing weight if you need to. ? Eating a healthy and balanced diet to prevent constipation.  Keep a journal or log to track how  much and when you drink and also when you feel the need to urinate. This will help your health care provider to monitor your condition. Contact a health care provider if:  Your symptoms do not get better after treatment.  Your pain and discomfort are getting worse.  You have more frequent urges to urinate.  You have a fever. Get help right away if: You are not able to control your bladder at all. This information is not intended to replace advice given to you by your health care provider. Make sure you discuss any questions you have with your health care provider. Document Released: 06/04/2009 Document Revised: 01/14/2016 Document Reviewed: 01/01/2014 Elsevier Interactive Patient Education  [employer].

## 2017-03-30 NOTE — Progress Notes (Signed)
Subjective:    Patient ID: Molly Banks, female    DOB: 12/14/1960, 56 y.o.   MRN: [id number]  Molly Banks is a 57 y.o. female presenting on 03/30/2017 for Establish Care (currently on workman comp 01/08/17, (Rt cervical strain, bicep, Right tendonitis))   HPI Establish Care New Provider Pt last seen by PCP in last couple of months.  Obtain records from [place] for [facility]  Would like to have annual physical for labs soon.  Currently established with Workman's Compensation for injury and is undergoing physical therapy for right cervical strain, bicep strain, and right sided upper extremity tendonitis  "Losing weight" Reports having lost weight after new job w/ constant movement/walking about 1 year ago.  Prior to that 120-130 lbs. 116 lbs 1 year ago. 125 lbs 2 years ago. - She  reports an exercise routine that includes walking, 5-6 days per week. - Her diet is moderate in salt, high in fat, and high in carbohydrates.  Has history of hypoglycemic events in 2007 w/ passing out. - No DM diagnosis.  Recurrent difficulty w/ dizziness  Smoking Cessation Pt would like to quit and wants to quit, but hasn't been able to quit in past.  She has multiple attempts to quit by using nicotine patches, vaping, and gums without success.  Currently smokes 1/2 ppd of ultralites. Pt states "they aren't like regular cigarettes."  Urge Incontinence When she is working has to wear depends, urge incontinence.  She notes she has an urge to void, then extreme urgency to get to the restroom.  Typically is unable to get to the restroom before leaking large amounts of urine.  She does not leak small amounts.  Difficulty is less at home because she has faster access to the bathroom.   Has taken vesicare 5 mg once daily in past w/ some relief.  (approx 2007).  Pt brings paper medical chart with her from her PCP from 1980s - 2008.  Review of records indicates: - several episodes hypoglycemia.  Resolved  w/ lifestyle center treatment for regular meals. - history of dizziness.  MRI revealed possible AVM on R side of brain.  Pt underwent cerebral arteriogram which was negative for AVM or tumor.  It does not appear she proceeded with any repeat imaging after that time.   - Td administered in 2002 -Repeated? Unknown - Long-term monitoring of right breast mass and breast microcalcification    Past Medical History:  Diagnosis Date  . COPD (chronic obstructive pulmonary disease) ([place])   . Erythrocytosis   . Frequent headaches   . GERD (gastroesophageal reflux disease)   . Hypoglycemia   . Leukocytosis    Past Surgical History:  Procedure Laterality Date  . ABDOMINAL HYSTERECTOMY    . CHOLECYSTECTOMY    . TONSILLECTOMY     Social History   Social History  . Marital status: Married    Spouse name: N/A  . Number of children: N/A  . Years of education: N/A   Occupational History  . Not on file.   Social History Main Topics  . Smoking status: Current Every Day Smoker    Packs/day: 0.50    Years: 30.00    Types: Cigarettes  . Smokeless tobacco: Former Systems developer  . Alcohol use No  . Drug use: No  . Sexual activity: Not on file   Other Topics Concern  . Not on file   Social History Narrative  . No narrative on file   Family History  Problem Relation Age of Onset  . Brain cancer Mother   . Cancer Mother   . Lung cancer Father   . COPD Father   . Cancer - Lung Father   . Lymphoma Sister   . Breast cancer Sister   . Breast cancer Daughter   . Breast cancer Maternal Grandmother   . Lung cancer Maternal Grandfather    Current Outpatient Prescriptions on File Prior to Visit  Medication Sig  . ibuprofen (ADVIL,MOTRIN) 600 MG tablet Take 1 tablet (600 mg total) by mouth every 8 (eight) hours as needed.  . meloxicam (MOBIC) 15 MG tablet Take 1 tablet (15 mg total) by mouth daily. (Patient not taking: Reported on 03/30/2017)  . metaxalone (SKELAXIN) 800 MG tablet Take 1 tablet (800 mg  total) by mouth 3 (three) times daily. (Patient not taking: Reported on 03/30/2017)   No current facility-administered medications on file prior to visit.     Review of Systems  Constitutional: Negative.   HENT: Negative.   Eyes: Negative.   Respiratory: Negative.   Cardiovascular: Negative.   Gastrointestinal: Negative.   Endocrine: Negative.   Genitourinary: Positive for urgency.  Musculoskeletal: Positive for arthralgias.  Skin: Negative.   Allergic/Immunologic: Negative.   Neurological: Negative.   Hematological: Negative.   Psychiatric/Behavioral: Negative.    Per HPI unless specifically indicated above      Objective:    BP (!) 102/59 (BP Location: Right Arm, Patient Position: Sitting, Cuff Size: Small)   Pulse 65   Temp 97.7 F (36.5 C) (Oral)   Ht _0  (1.6 m)   Wt 112 lb 12.8 oz (51.2 kg)   SpO2 99%   BMI 19.98 kg/m   Wt Readings from Last 3 Encounters:  03/30/17 112 lb 12.8 oz (51.2 kg)  01/08/17 112 lb (50.8 kg)  12/23/16 112 lb (50.8 kg)    Physical Exam  General - healthy, well-appearing, NAD HEENT - Normocephalic, atraumatic Neck - supple, non-tender, no LAD Heart - RRR, no murmurs heard Lungs - Clear throughout all lobes, no wheezing, crackles, or rhonchi. Normal work of breathing. Extremeties - non-tender, no edema, cap refill < 2 seconds, peripheral pulses intact +2 bilaterally Skin - warm, dry Neuro - awake, alert, oriented x3, normal gait Psych - Normal mood and affect, normal behavior    Results for orders placed or performed during the hospital encounter of 24/26/83  Basic metabolic panel  Result Value Ref Range   Sodium 140 135 - 145 mmol/L   Potassium 4.0 3.5 - 5.1 mmol/L   Chloride 106 101 - 111 mmol/L   CO2 26 22 - 32 mmol/L   Glucose, Bld 85 65 - 99 mg/dL   BUN 10 6 - 20 mg/dL   Creatinine, Ser 0.71 0.44 - 1.00 mg/dL   Calcium 8.9 8.9 - 10.3 mg/dL   GFR calc non Af Amer >60 >60 mL/min   GFR calc Af Amer >60 >60 mL/min   Anion  gap 8 5 - 15  CBC  Result Value Ref Range   WBC 10.1 3.6 - 11.0 K/uL   RBC 4.92 3.80 - 5.20 MIL/uL   Hemoglobin 14.3 12.0 - 16.0 g/dL   HCT 41.3 35.0 - 47.0 %   MCV 83.9 80.0 - 100.0 fL   MCH 29.1 26.0 - 34.0 pg   MCHC 34.7 32.0 - 36.0 g/dL   RDW 13.5 11.5 - 14.5 %   Platelets 275 150 - 440 K/uL  Urinalysis, Complete w Microscopic  Result Value  Ref Range   Color, Urine COLORLESS (A) YELLOW   APPearance CLEAR (A) CLEAR   Specific Gravity, Urine 1.000 (L) 1.005 - 1.030   pH 7.0 5.0 - 8.0   Glucose, UA NEGATIVE NEGATIVE mg/dL   Hgb urine dipstick SMALL (A) NEGATIVE   Bilirubin Urine NEGATIVE NEGATIVE   Ketones, ur NEGATIVE NEGATIVE mg/dL   Protein, ur NEGATIVE NEGATIVE mg/dL   Nitrite NEGATIVE NEGATIVE   Leukocytes, UA NEGATIVE NEGATIVE   RBC / HPF 0-5 0 - 5 RBC/hpf   WBC, UA 0-5 0 - 5 WBC/hpf   Bacteria, UA NONE SEEN NONE SEEN   Squamous Epithelial / LPF NONE SEEN NONE SEEN      Assessment & Plan:   Problem List Items Addressed This Visit    None    Visit Diagnoses    Urge incontinence    -  Primary Pt with urge incontinence w/ large volume of urine requiring depends when unable to quickly get to a bathroom. Symptoms consistent w/ overactive bladder.  Plan: 1. Start oxybutynin 10 mg 24 hr tablet once daily.  Cautioned on side effects for dizziness, dry mouth. 2. Consider urology referral if no improvement on medications. 3. Follow up 3 months or as needed.   Relevant Medications   oxybutynin (DITROPAN XL) 10 MG 24 hr tablet   Encounter for smoking cessation counseling     Pt desires to quit smoking.  Has not been successful w/ cessation attempts in past.  Discussion about treatment options presented patch, gum, Cognitive behavioral therapy, Chantix, and Wellbutrin.  Pt desires Wellbutrin.  Discussion today >10 minutes specifically on counseling on risks of tobacco use, complications, treatment, smoking cessation as above.  Plan: 1. START bupropion 12 hr tablet 150 mg  once daily for 3 days, then take twice daily and continue for at least 7 weeks.  Set quit date 7 days after starting and aim to quit or reduce by 50%.  Can continue up to 12 weeks if needed. 2. Discussed other tips for quitting. 3. Follow up 6 weeks.    Relevant Medications   buPROPion (WELLBUTRIN SR) 150 MG 12 hr tablet   Encounter to establish care     Pt w/ need for new provider.  Pt brings old chart and recent records are in care everywhere.  Family history reviewed - Strong family history of breast cancer - BRCA positive in sister.  Pt desires BRCA testing.  Plan: - BRCA testing w/ next labs for annual physical - No recent annual physical - will perform at next available convenience for pt.      Meds ordered this encounter  Medications  . oxybutynin (DITROPAN XL) 10 MG 24 hr tablet    Sig: Take 1 tablet (10 mg total) by mouth at bedtime.    Dispense:  30 tablet    Refill:  5    Order Specific Question:   Supervising Provider    Answer:   Olin Hauser [[id number]]  . buPROPion (WELLBUTRIN SR) 150 MG 12 hr tablet    Sig: Day 1-3: take one tablet once daily.  Day 4 and after: take one tablet twice daily.    Dispense:  60 tablet    Refill:  2    Order Specific Question:   Supervising Provider    Answer:   Olin Hauser [[id number]]    Follow up plan: Return in about 6 weeks (around 05/11/2017) for smoking cessation and annual physical at your next convenience.  Cassell Smiles,  DNP, AGPCNP-BC Adult Gerontology Primary Care Nurse Practitioner [facility] 03/30/2017, 10:20 AM

## 2017-03-31 ENCOUNTER — Other Ambulatory Visit: Payer: Self-pay | Admitting: Nurse Practitioner

## 2017-03-31 DIAGNOSIS — Z0000 Encounter for general adult medical examination without abnormal findings: Secondary | ICD-10-CM

## 2017-03-31 DIAGNOSIS — Z8481 Family history of carrier of genetic disease: Secondary | ICD-10-CM

## 2017-03-31 NOTE — Addendum Note (Signed)
Addended by: Wilhelmina McardleKENNEDY, Olla Delancey R on: 03/31/2017 12:43 PM   Modules accepted: Orders

## 2017-03-31 NOTE — Progress Notes (Signed)
I have reviewed this encounter including the documentation in this note and/or discussed this patient with the provider, Wilhelmina McardleLauren Kennedy, AGPCNP-BC. I am certifying that I agree with the content of this note as supervising physician.  Saralyn PilarAlexander Joaquim Tolen, DO [facility] 03/31/2017, 12:53 PM

## 2017-04-03 ENCOUNTER — Other Ambulatory Visit: Payer: BLUE CROSS/BLUE SHIELD

## 2017-04-03 ENCOUNTER — Encounter: Payer: Self-pay | Admitting: Nurse Practitioner

## 2017-04-03 ENCOUNTER — Ambulatory Visit (INDEPENDENT_AMBULATORY_CARE_PROVIDER_SITE_OTHER): Payer: BLUE CROSS/BLUE SHIELD | Admitting: Nurse Practitioner

## 2017-04-03 VITALS — BP 108/65 | HR 65 | Temp 97.5°F | Ht 63.0 in | Wt 114.2 lb

## 2017-04-03 DIAGNOSIS — Z13820 Encounter for screening for osteoporosis: Secondary | ICD-10-CM

## 2017-04-03 DIAGNOSIS — Z1239 Encounter for other screening for malignant neoplasm of breast: Secondary | ICD-10-CM

## 2017-04-03 DIAGNOSIS — Z0000 Encounter for general adult medical examination without abnormal findings: Secondary | ICD-10-CM

## 2017-04-03 DIAGNOSIS — Z122 Encounter for screening for malignant neoplasm of respiratory organs: Secondary | ICD-10-CM

## 2017-04-03 DIAGNOSIS — Z1231 Encounter for screening mammogram for malignant neoplasm of breast: Secondary | ICD-10-CM

## 2017-04-03 DIAGNOSIS — F17209 Nicotine dependence, unspecified, with unspecified nicotine-induced disorders: Secondary | ICD-10-CM

## 2017-04-03 DIAGNOSIS — Z8481 Family history of carrier of genetic disease: Secondary | ICD-10-CM

## 2017-04-03 LAB — CBC WITH DIFFERENTIAL/PLATELET
Basophils Absolute: 75 cells/uL (ref 0–200)
Basophils Relative: 1 %
Eosinophils Absolute: 375 cells/uL (ref 15–500)
Eosinophils Relative: 5 %
HCT: 46.6 % — ABNORMAL HIGH (ref 35.0–45.0)
Hemoglobin: 15.5 g/dL (ref 11.7–15.5)
Lymphocytes Relative: 39 %
Lymphs Abs: 2925 cells/uL (ref 850–3900)
MCH: 29.1 pg (ref 27.0–33.0)
MCHC: 33.3 g/dL (ref 32.0–36.0)
MCV: 87.6 fL (ref 80.0–100.0)
MPV: 9.2 fL (ref 7.5–12.5)
Monocytes Absolute: 675 cells/uL (ref 200–950)
Monocytes Relative: 9 %
Neutro Abs: 3450 cells/uL (ref 1500–7800)
Neutrophils Relative %: 46 %
Platelets: 290 10*3/uL (ref 140–400)
RBC: 5.32 MIL/uL — ABNORMAL HIGH (ref 3.80–5.10)
RDW: 13.9 % (ref 11.0–15.0)
WBC: 7.5 10*3/uL (ref 3.8–10.8)

## 2017-04-03 NOTE — Progress Notes (Signed)
I have reviewed this encounter including the documentation in this note and/or discussed this patient with the provider, Wilhelmina McardleLauren Kennedy, AGPCNP-BC. I am certifying that I agree with the content of this note as supervising physician.  Saralyn PilarAlexander Kaimen Peine, DO [facility] 04/03/2017, 12:36 PM

## 2017-04-03 NOTE — Progress Notes (Signed)
Subjective:    Patient ID: Molly Banks, female    DOB: Jun 10, 1961, 56 y.o.   MRN: [id number]  Molly Banks is a 56 y.o. female presenting on 04/03/2017 for Annual Exam   HPI Annual Physical Exam Patient has been feeling well.  They have no acute concerns today. Sleeps 6.5 hours per night interrupted 2-3 times.  HEALTH MAINTENANCE: Weight/BMI: healthy Physical activity: active job and active when at home Diet: Regular diet, no modifications Seatbelt: always Sunscreen: rarely PAP: - not applicable (hysterectomy - not for cervical cancer) Mammogram: Due 10/2016  DEXA: unknown, likely never Colonoscopy: negative 12/2013   Tobacco User > 30 years.  Pt desires lung CA CT scan for screening.  Never performed in past.  VACCINES: Tetanus: 08/2010 Pneumonia: declines Shingles: has history of chicken pox - never received vaccine but declines today Flu: declines  Past Medical History:  Diagnosis Date  . Breast lump in female 2005  . COPD (chronic obstructive pulmonary disease) ([place])   . Erythrocytosis   . Frequent headaches    tension related  . GERD (gastroesophageal reflux disease)   . Hypoglycemia   . Leukocytosis   . Thoracic scoliosis    Left concavity   Past Surgical History:  Procedure Laterality Date  . ABDOMINAL HYSTERECTOMY     oophrectomy NOT perfomed  . CHOLECYSTECTOMY    . TONSILLECTOMY  1966   Social History   Social History  . Marital status: Married    Spouse name: N/A  . Number of children: N/A  . Years of education: N/A   Occupational History  . Not on file.   Social History Main Topics  . Smoking status: Current Every Day Smoker    Packs/day: 0.50    Years: 30.00    Types: Cigarettes  . Smokeless tobacco: Former Systems developer     Comment: 14, 21s  . Alcohol use No  . Drug use: No  . Sexual activity: Not on file   Other Topics Concern  . Not on file   Social History Narrative  . No narrative on file   Family History  Problem Relation Age of  Onset  . Brain cancer Mother   . Cancer Mother        brain  . Hiatal hernia Mother   . Lung cancer Father   . COPD Father   . Cancer - Lung Father   . Lymphoma Sister   . Breast cancer Sister 91       BRCA positive test  . Breast cancer Daughter 57  . Breast cancer Maternal Grandmother   . Lung cancer Maternal Grandfather   . Ovarian cancer Neg Hx   . Colon cancer Neg Hx   . Stroke Neg Hx   . Heart disease Neg Hx    Current Outpatient Prescriptions on File Prior to Visit  Medication Sig  . ibuprofen (ADVIL,MOTRIN) 600 MG tablet Take 1 tablet (600 mg total) by mouth every 8 (eight) hours as needed.  Marland Kitchen buPROPion (WELLBUTRIN SR) 150 MG 12 hr tablet Day 1-3: take one tablet once daily.  Day 4 and after: take one tablet twice daily. (Patient not taking: Reported on 04/03/2017)  . oxybutynin (DITROPAN XL) 10 MG 24 hr tablet Take 1 tablet (10 mg total) by mouth at bedtime. (Patient not taking: Reported on 04/03/2017)   No current facility-administered medications on file prior to visit.     Review of Systems Per HPI unless specifically indicated above  Objective:    BP 108/65 (BP Location: Right Arm, Patient Position: Sitting, Cuff Size: Small)   Pulse 65   Temp (!) 97.5 F (36.4 C) (Oral)   Ht 5' 3" (1.6 m)   Wt 114 lb 3.2 oz (51.8 kg)   SpO2 100%   BMI 20.23 kg/m   Wt Readings from Last 3 Encounters:  04/03/17 114 lb 3.2 oz (51.8 kg)  03/30/17 112 lb 12.8 oz (51.2 kg)  01/08/17 112 lb (50.8 kg)    Physical Exam  Pulmonary/Chest: Right breast exhibits mass. Right breast exhibits no inverted nipple, no nipple discharge, no skin change and no tenderness. Left breast exhibits mass. Left breast exhibits no inverted nipple, no nipple discharge, no skin change and no tenderness. Breasts are symmetrical.      General - healthy, well-appearing, NAD HEENT - Normocephalic, atraumatic, PERRL, EOMI, patent nares w/o congestion, oropharynx clear, MMM Neck - supple, non-tender,  no LAD, no thyromegaly,  no carotid bruit Heart - RRR, no murmurs heard Lungs - Clear throughout all lobes, no wheezing, crackles, or rhonchi. Normal work of breathing. Abdomen - soft, NTND, no masses, no hepatosplenomegaly, active bowel sounds GU - deferred by pt Extremeties - non-tender, no edema, cap refill < 2 seconds, peripheral pulses intact +2 bilaterally Skin - warm, dry, no rashes Neuro - awake, alert, oriented x3, CN II-X intact, intact muscle strength 5/5 bilaterally, intact distal sensation to light touch, normal coordination, normal gait Psych - Normal mood and affect, normal behavior   Results for orders placed or performed in visit on 03/30/17  HM MAMMOGRAPHY  Result Value Ref Range   HM Mammogram Self Reported Normal 0-4 Bi-Rad, Self Reported Normal  HM COLONOSCOPY  Result Value Ref Range   HM Colonoscopy See Report (in chart) See Report (in chart), Patient Reported      Assessment & Plan:   Problem List Items Addressed This Visit      Other   Tobacco use disorder, continuous Pt desires to stop smoking.  Plan already in place for Wellbutrin.  Pt has set quit date for 2 weeks from today.   Relevant Orders   Ambulatory Referral for Lung Cancer Scre    Other Visit Diagnoses    Breast cancer screening    -  Primary Pt w/ significant family history of breast cancer and BRCA positive genetic testing in her sister.  Needs biannual breast ca screening.    Plan: 1. Placed screening mammogram order.  Pt desires to request 3-D mammogram if BRCA positive after testing. 2. Lab drawn today.  Follow up after results.   Relevant Orders   MM DIGITAL SCREENING BILATERAL   Encounter for annual physical exam     Physical exam with no new findings.  Well adult with no acute concerns.  Plan: 1. Obtain health maintenance screenings. Labs drawn today. 2. Return 1 year for annual physical.    Osteoporosis screening     Pt w/ total hysterectomy.  No prior DEXA scanning.     Plan: 1. Obtain DG bone density.     Relevant Orders   DG Bone Density   Encounter for screening for lung cancer     Pt w/ > 30 year smoking history.  1 ppd x 30 years  (started smoking at age 15, quit in 37s, then resumed a couple years later).   Relevant Orders   Ambulatory Referral for Lung Cancer Scre        Follow up plan: Return in about  1 year (around 04/03/2018) for annual physical.  Cassell Smiles, DNP, AGPCNP-BC Adult Gerontology Primary Care Nurse Practitioner [facility] 04/03/2017, 9:56 AM

## 2017-04-03 NOTE — Patient Instructions (Addendum)
Jarvis, Thank you for coming in to clinic today.  1. There is nothing new for findings on your physical exam today.  2. Mammogram and bone density scan: Your mammogram and DEXA orders have been placed.  Call the Scheduling phone number at [phone] to schedule your mammogram and DEXA at your convenience.  You can choose to go to either location listed below.  Let the scheduler know which location you prefer.  They can be done on the same day.  [facility] Outpatient Radiology [address] [place], [place] [address] [place], [place] [address]    Please schedule a follow-up appointment with [name], AGNP. Return in about 1 year (around 04/03/2018) for annual physical.  If you have any other questions or concerns, please feel free to call the clinic or send a message through MyChart. You may also schedule an earlier appointment if necessary.  You will receive a survey after today's visit either digitally by e-mail or paper by [employer]. Your experiences and feedback matter to [place].  Please respond so we know how we are doing as we provide care for you.   [name], DNP, AGNP-BC Adult Gerontology Nurse Practitioner [facility], [facility]    Bone Densitometry Bone densitometry is an imaging test that uses a special X-[place]aine to measure the amount of calcium and other minerals in your bones (bone density). This test is also known as a bone mineral density test or dual-energy X-Hoe absorptiometry (DXA). The test can measure bone density at your hip and your spine. It is similar to having a regular X-Budney. You may have this test to:  Diagnose a condition that causes weak or thin bones (osteoporosis).  Predict your risk of a broken bone (fracture).  Determine how well osteoporosis treatment is working.  Tell a health care provider about:  Any allergies you have.  All  medicines you are taking, including vitamins, herbs, eye drops, creams, and over-the-counter medicines.  Any problems you or family members have had with anesthetic medicines.  Any blood disorders you have.  Any surgeries you have had.  Any medical conditions you have.  Possibility of pregnancy.  Any other medical test you had within the previous 14 days that used contrast material. What are the risks? Generally, this is a safe procedure. However, problems can occur and may include the following:  This test exposes you to a very small amount of radiation.  The risks of radiation exposure may be greater to unborn children.  What happens before the procedure?  Do not take any calcium supplements for 24 hours before having the test. You can otherwise eat and drink what you usually do.  Take off all metal jewelry, eyeglasses, dental appliances, and any other metal objects. What happens during the procedure?  You may lie on an exam table. There will be an X-Gordin generator below you and an imaging device above you.  Other devices, such as boxes or braces, may be used to position your body properly for the scan.  You will need to lie still while the machine slowly scans your body.  The images will show up on a computer monitor. What happens after the procedure? You may need more testing at a later time. This information is not intended to replace advice given to you by your health care provider. Make sure you discuss any questions you have with your health care provider. Document  Released: 08/30/2004 Document Revised: 01/14/2016 Document Reviewed: 01/16/2014 Elsevier Interactive Patient Education  [employer]2018 Elsevier Inc.

## 2017-04-04 ENCOUNTER — Telehealth: Payer: Self-pay | Admitting: *Deleted

## 2017-04-04 DIAGNOSIS — Z122 Encounter for screening for malignant neoplasm of respiratory organs: Secondary | ICD-10-CM

## 2017-04-04 DIAGNOSIS — Z87891 Personal history of nicotine dependence: Secondary | ICD-10-CM

## 2017-04-04 LAB — LIPID PANEL
Cholesterol: 205 mg/dL — ABNORMAL HIGH (ref <200)
HDL: 76 mg/dL (ref >50)
LDL Cholesterol: 117 mg/dL — ABNORMAL HIGH (ref <100)
Total CHOL/HDL Ratio: 2.7 Ratio (ref <5.0)
Triglycerides: 59 mg/dL (ref <150)
VLDL: 12 mg/dL (ref <30)

## 2017-04-04 LAB — COMPREHENSIVE METABOLIC PANEL
ALT: 3 U/L — ABNORMAL LOW (ref 6–29)
AST: 12 U/L (ref 10–35)
Albumin: 4.1 g/dL (ref 3.6–5.1)
Alkaline Phosphatase: 80 U/L (ref 33–130)
BUN: 10 mg/dL (ref 7–25)
CO2: 27 mmol/L (ref 20–32)
Calcium: 9.1 mg/dL (ref 8.6–10.4)
Chloride: 105 mmol/L (ref 98–110)
Creat: 0.7 mg/dL (ref 0.50–1.05)
Glucose, Bld: 79 mg/dL (ref 65–99)
Potassium: 4.1 mmol/L (ref 3.5–5.3)
Sodium: 141 mmol/L (ref 135–146)
Total Bilirubin: 0.6 mg/dL (ref 0.2–1.2)
Total Protein: 6.4 g/dL (ref 6.1–8.1)

## 2017-04-04 LAB — HEMOGLOBIN A1C
Hgb A1c MFr Bld: 5.2 % (ref <5.7)
Mean Plasma Glucose: 103 mg/dL

## 2017-04-04 LAB — TSH: TSH: 1.88 mIU/L

## 2017-04-04 NOTE — Telephone Encounter (Signed)
Received referral for initial lung cancer screening scan. Contacted patient and obtained smoking history,(current, 30 pack year) as well as answering questions related to screening process. Patient denies signs of lung cancer such as weight loss or hemoptysis. Patient denies comorbidity that would prevent curative treatment if lung cancer were found. Patient is scheduled for shared decision making visit and CT scan on 04/18/17.

## 2017-04-05 ENCOUNTER — Telehealth: Payer: Self-pay

## 2017-04-05 NOTE — Telephone Encounter (Signed)
The pt called twice today requesting her lab results from Monday. I informed the pt that you have not reviewed the labs, because they probably haven't all finalize. I also informed the patient that [facility] test can take over a week before it results.

## 2017-04-12 LAB — BRCAVANTAGE, COMPREHENSIVE
BRCA1 Del/Dup Interp: NOT DETECTED
BRCA1 Del/Dup: NEGATIVE
BRCA1 Seq Interp.: NOT DETECTED
BRCA1 Sequencing: NEGATIVE
BRCA2 Del/Dup Interp: NOT DETECTED
BRCA2 Del/Dup: NEGATIVE
BRCA2 Seq Intrerp: NOT DETECTED
BRCA2 Sequencing: NEGATIVE

## 2017-04-13 ENCOUNTER — Other Ambulatory Visit: Payer: Self-pay | Admitting: Family

## 2017-04-13 ENCOUNTER — Ambulatory Visit
Admission: AD | Admit: 2017-04-13 | Discharge: 2017-04-13 | Disposition: A | Discharge status: Home / Self Care | Payer: Worker's Compensation | Source: Ambulatory Visit | Attending: Family | Admitting: Family

## 2017-04-13 ENCOUNTER — Ambulatory Visit
Admission: RE | Admit: 2017-04-13 | Discharge: 2017-04-13 | Disposition: A | Discharge status: Home / Self Care | Payer: Worker's Compensation | Source: Ambulatory Visit | Attending: Family | Admitting: Family

## 2017-04-13 DIAGNOSIS — M47892 Other spondylosis, cervical region: Secondary | ICD-10-CM | POA: Insufficient documentation

## 2017-04-13 DIAGNOSIS — M542 Cervicalgia: Secondary | ICD-10-CM | POA: Diagnosis present

## 2017-04-13 DIAGNOSIS — R52 Pain, unspecified: Secondary | ICD-10-CM

## 2017-04-13 DIAGNOSIS — Z981 Arthrodesis status: Secondary | ICD-10-CM | POA: Diagnosis not present

## 2017-04-13 DIAGNOSIS — M25511 Pain in right shoulder: Secondary | ICD-10-CM | POA: Diagnosis present

## 2017-04-13 LAB — HM MAMMOGRAPHY

## 2017-04-17 ENCOUNTER — Encounter: Payer: Self-pay | Admitting: Oncology

## 2017-04-18 ENCOUNTER — Inpatient Hospital Stay: Payer: BLUE CROSS/BLUE SHIELD | Attending: Oncology | Admitting: Oncology

## 2017-04-18 ENCOUNTER — Ambulatory Visit
Admission: RE | Admit: 2017-04-18 | Discharge: 2017-04-18 | Disposition: A | Discharge status: Home / Self Care | Payer: BLUE CROSS/BLUE SHIELD | Source: Ambulatory Visit | Attending: Oncology | Admitting: Oncology

## 2017-04-18 DIAGNOSIS — I700 Atherosclerosis of aorta: Secondary | ICD-10-CM | POA: Diagnosis not present

## 2017-04-18 DIAGNOSIS — F17210 Nicotine dependence, cigarettes, uncomplicated: Secondary | ICD-10-CM | POA: Diagnosis not present

## 2017-04-18 DIAGNOSIS — Z87891 Personal history of nicotine dependence: Secondary | ICD-10-CM | POA: Insufficient documentation

## 2017-04-18 DIAGNOSIS — Z122 Encounter for screening for malignant neoplasm of respiratory organs: Secondary | ICD-10-CM

## 2017-04-18 DIAGNOSIS — E042 Nontoxic multinodular goiter: Secondary | ICD-10-CM | POA: Insufficient documentation

## 2017-04-18 DIAGNOSIS — J439 Emphysema, unspecified: Secondary | ICD-10-CM | POA: Insufficient documentation

## 2017-04-18 NOTE — Progress Notes (Signed)
In accordance with CMS guidelines, patient has met eligibility criteria including age, absence of signs or symptoms of lung cancer.  Social History  Substance Use Topics  . Smoking status: Current Every Day Smoker    Packs/day: 1.00    Years: 30.00    Types: Cigarettes  . Smokeless tobacco: Former Systems developer     Comment: 14, 26s  . Alcohol use No     A shared decision-making session was conducted prior to the performance of CT scan. This includes one or more decision aids, includes benefits and harms of screening, follow-up diagnostic testing, over-diagnosis, false positive rate, and total radiation exposure.  Counseling on the importance of adherence to annual lung cancer LDCT screening, impact of co-morbidities, and ability or willingness to undergo diagnosis and treatment is imperative for compliance of the program.  Counseling on the importance of continued smoking cessation for former smokers; the importance of smoking cessation for current smokers, and information about tobacco cessation interventions have been given to patient including [place] and [facility] quit [place] programs.  Written order for lung cancer screening with LDCT has been given to the patient and any and all questions have been answered to the best of my abilities.   Yearly follow up will be coordinated by Burgess Estelle, Thoracic Navigator.  Faythe Casa, NP 04/18/2017 2:13 PM

## 2017-04-21 ENCOUNTER — Telehealth: Payer: Self-pay | Admitting: *Deleted

## 2017-04-21 NOTE — Telephone Encounter (Signed)
Notified patient of LDCT lung cancer screening program results with recommendation for 12 month follow up imaging. Also notified of incidental findings noted below, specifically thyroid finding, and is encouraged to discuss further with PCP who will receive a copy of this note and/or the CT report. Patient verbalizes understanding.   IMPRESSION: 1. Lung-RADS 1, negative. Continue annual screening with low-dose chest CT without contrast in 12 months. 2.  Aortic Atherosclerosis (ICD10-I70.0). 3.  Emphysema (ICD10-J43.9). 4. Right-sided thyroid nodules, as above. These could either be re-evaluated on subsequent CT or further characterized with thyroid ultrasound.

## 2017-04-25 ENCOUNTER — Other Ambulatory Visit: Payer: Self-pay | Admitting: Nurse Practitioner

## 2017-04-25 DIAGNOSIS — E041 Nontoxic single thyroid nodule: Secondary | ICD-10-CM

## 2017-04-27 ENCOUNTER — Other Ambulatory Visit: Payer: Self-pay

## 2017-05-01 ENCOUNTER — Encounter: Payer: Self-pay | Admitting: Nurse Practitioner

## 2017-05-01 ENCOUNTER — Ambulatory Visit (INDEPENDENT_AMBULATORY_CARE_PROVIDER_SITE_OTHER): Payer: BLUE CROSS/BLUE SHIELD | Admitting: Nurse Practitioner

## 2017-05-01 VITALS — BP 120/75 | HR 58 | Temp 97.6°F | Ht 62.0 in | Wt 110.4 lb

## 2017-05-01 DIAGNOSIS — Z1371 Encounter for nonprocreative screening for genetic disease carrier status: Secondary | ICD-10-CM | POA: Insufficient documentation

## 2017-05-01 DIAGNOSIS — F17209 Nicotine dependence, unspecified, with unspecified nicotine-induced disorders: Secondary | ICD-10-CM

## 2017-05-01 DIAGNOSIS — N3941 Urge incontinence: Secondary | ICD-10-CM | POA: Diagnosis not present

## 2017-05-01 DIAGNOSIS — Z716 Tobacco abuse counseling: Secondary | ICD-10-CM

## 2017-05-01 DIAGNOSIS — Z720 Tobacco use: Secondary | ICD-10-CM

## 2017-05-01 DIAGNOSIS — J029 Acute pharyngitis, unspecified: Secondary | ICD-10-CM

## 2017-05-01 MED ORDER — SOLIFENACIN SUCCINATE 5 MG PO TABS: 5.0000 mg | ORAL_TABLET | Freq: Every day | ORAL | 5 refills | Status: DC

## 2017-05-01 NOTE — Assessment & Plan Note (Signed)
Uncontrolled.  Medication unaffordable when prescribed about 4 weeks ago.  Pt would still like medication if it is affordable, but is otherwise ok w/ using Depends at work.  That is the only time pt has difficulty w/ urge incontinence and soiling of clothing.  Plan: 1. Try Vesicare 5 mg once daily.  Pt to call if other option for management is more affordable per her pharmacy. 2. Follow up as needed.

## 2017-05-01 NOTE — Assessment & Plan Note (Signed)
Pt w/ ongoing use of cigarettes but down from 1ppd to 1/2 ppd w/o medications.  Pt will start Wellbutrin once she can afford it.  Has not had significant cravings for cigarettes except for early am.  Plan: 1. Continue cutting back cigarette smoking.  Set goal w/ pt to try to reduce from 10 cigarettes per day to 3-5 cigarettes per day in 2 weeks. 2. Reviewed alternative stress management strategies. 3. Schedule follow up 4 weeks after starting Wellbutrin.  Otherwise, will continue counseling at annual physical.

## 2017-05-01 NOTE — Patient Instructions (Addendum)
Molly Banks, Thank you for coming in to clinic today.  1. For your sore throat: - Call clinic if this continues for 7-10 more days.  2. For your smoking cessation: - GREAT WORK!  We want you to continue reducing the total number of cigarettes per day even more.  Try getting to 3-5 cigarettes per day by 05/12/17.  3. For your bladder: - Try Vesicare once daily for your bladder urgency.  Ask your pharmacy if there are any other medicines in this drug class that your insurance covers.  If there is one you would purchase, let me know what it is.  Please schedule a follow-up appointment with Wilhelmina McardleLauren Hazel Leveille, AGNP. Return 4 weeks after starting Wellbutrin and if symptoms worsen or fail to improve.  If you have any other questions or concerns, please feel free to call the clinic or send a message through MyChart. You may also schedule an earlier appointment if necessary.  You will receive a survey after today's visit either digitally by e-mail or paper by [employer] mail. Your experiences and feedback matter to us.  Please respond so we know how we are doing as we provide care for you.   Wilhelmina McardleLauren Solina Heron, DNP, AGNP-BC Adult Gerontology Nurse Practitioner [facility], [facility]

## 2017-05-01 NOTE — Progress Notes (Signed)
Subjective:    Patient ID: Molly Banks, female    DOB: 1961-05-30, 56 y.o.   MRN: [id number]  Molly Banks is a 56 y.o. female presenting on 05/01/2017 for Nicotine Dependence (pt currently down to 1/2 pack daily) and Urinary Incontinence (urgency)   HPI Sore Throat Sore throat started yesterday. Has had sick contacts at work.  No nasal congestion or drainage, cough, ear pressure or pain, no fever, chills.    Has had some hot flashes again - Started 2-3 weeks ago, but associated w/ perimenopause/postmenopause.   Smoking Cessation Has cut back smoking w/o medication to 1/2 ppd.  When at work it is not a problem.  Has started taking candy to work.  Now wants cigarettes first thing in the morning.    Leaves cigarettes in car at night so she doesn't want to smoke them.  Has coupon for her Wellbutrin, but has not yet gotten it picked up.   Urinary Urgency Oxybutynin was not affordable for her.  Requests alternative medication.  For now is ok w/ wearing depends when at work.  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    Social History  Substance Use Topics  . Smoking status: Current Every Day Smoker    Packs/day: 0.50    Years: 30.00    Types: Cigarettes  . Smokeless tobacco: Former Neurosurgeon     Comment: 14, 20s  . Alcohol use No    Review of Systems Per HPI unless specifically indicated above     Objective:    BP 120/75 (BP Location: Right Arm, Patient Position: Sitting, Cuff Size: Small)   Pulse (!) 58   Temp 97.6 F (36.4 C) (Oral)   Ht  (1.575 m)   Wt 110 lb 6.4 oz (50.1 kg)   SpO2 100%   BMI 20.19 kg/m   Wt Readings from Last 3 Encounters:  05/01/17 110 lb 6.4 oz (50.1 kg)  04/18/17 112 lb (50.8 kg)  04/03/17 114 lb 3.2 oz (51.8 kg)    Physical Exam  General - healthy, well-appearing, NAD HEENT - Normocephalic, atraumatic, PERRL, EOMI,  patent nares w/o congestion, oropharynx clear, MMM, TM normal bilaterally, Ear canal normal bilaterally, external ear normal w/o lesions Neck - supple, non-tender, no LAD Heart - RRR, no murmurs heard Lungs - Clear throughout all lobes after cough (Rhonchi prior to chough), no wheezing, crackles, or rhonchi. Normal work of breathing. Extremeties - non-tender, no edema, cap refill < 2 seconds, peripheral pulses intact +2 bilaterally Skin - warm, dry Neuro - awake, alert, oriented x3, normal gait Psych - Normal mood and affect, normal behavior    Results for orders placed or performed in visit on 04/27/17  HM MAMMOGRAPHY  Result Value Ref Range   HM Mammogram 0-4 Bi-Rad 0-4 Bi-Rad, Self Reported Normal      Assessment & Plan:   Problem List Items Addressed This Visit      Other   Tobacco use disorder, continuous    Pt w/ ongoing use of cigarettes but down from 1ppd to 1/2 ppd w/o medications.  Pt will start Wellbutrin once she can afford it.  Has not had significant cravings for cigarettes except for early am.  Plan: 1. Continue cutting back cigarette smoking.  Set goal w/ pt to try to reduce from 10 cigarettes per day to 3-5 cigarettes  per day in 2 weeks. 2. Reviewed alternative stress management strategies. 3. Schedule follow up 4 weeks after starting Wellbutrin.  Otherwise, will continue counseling at annual physical.      Urge incontinence - Primary    Uncontrolled.  Medication unaffordable when prescribed about 4 weeks ago.  Pt would still like medication if it is affordable, but is otherwise ok w/ using Depends at work.  That is the only time pt has difficulty w/ urge incontinence and soiling of clothing.  Plan: 1. Try Vesicare 5 mg once daily.  Pt to call if other option for management is more affordable per her pharmacy. 2. Follow up as needed.      Relevant Medications   solifenacin (VESICARE) 5 MG tablet    Other Visit Diagnoses    Encounter for smoking cessation  counseling     See Tobacco use disorder above for A/P  Discussion today >10 minutes specifically on counseling on risks of tobacco use, complications, treatment, smoking cessation and tips for quitting w/o medications.  Could use Wellbutrin once financially able (pt has coupon from [employer]).      Acute sore throat     Likely viral in nature since is isolated complaint and no focal infections of HEENT noted.  Plan: 1. Reassurance provided. 2. Follow up 7-10 days as needed for unresolving symptoms.      Meds ordered this encounter  Medications  . solifenacin (VESICARE) 5 MG tablet    Sig: Take 1 tablet (5 mg total) by mouth daily.    Dispense:  30 tablet    Refill:  5    Follow up plan: Return 4 weeks after starting Wellbutrin and if symptoms worsen or fail to improve.   Molly McardleLauren Frisco Cordts, DNP, AGPCNP-BC Adult Gerontology Primary Care Nurse Practitioner [facility] 05/01/2017, 8:15 AM

## 2017-05-05 ENCOUNTER — Ambulatory Visit
Admission: RE | Admit: 2017-05-05 | Discharge: 2017-05-05 | Disposition: A | Discharge status: Home / Self Care | Payer: BLUE CROSS/BLUE SHIELD | Source: Ambulatory Visit | Attending: Nurse Practitioner | Admitting: Nurse Practitioner

## 2017-05-05 DIAGNOSIS — E041 Nontoxic single thyroid nodule: Secondary | ICD-10-CM | POA: Insufficient documentation

## 2017-05-08 ENCOUNTER — Other Ambulatory Visit: Payer: Self-pay | Admitting: Nurse Practitioner

## 2017-05-08 DIAGNOSIS — E041 Nontoxic single thyroid nodule: Secondary | ICD-10-CM

## 2017-07-18 ENCOUNTER — Ambulatory Visit: Payer: Self-pay | Admitting: Nurse Practitioner

## 2017-07-18 ENCOUNTER — Telehealth: Payer: Self-pay

## 2017-07-18 NOTE — Telephone Encounter (Signed)
-----   Message from Galen ManilaLauren Renee Kennedy, NP sent at 07/17/2017 11:59 AM EST ----- Regarding: Need appointment for pre-op clearance Pt needs appointment for pre-op clearance.  Will need EKG and UA

## 2017-07-18 NOTE — Telephone Encounter (Signed)
The pt was scheduled for a nurse visit per Lauren for EKG, UA per surgical clearance.

## 2017-07-25 ENCOUNTER — Encounter
Admission: RE | Admit: 2017-07-25 | Discharge: 2017-07-25 | Disposition: A | Discharge status: Home / Self Care | Payer: Worker's Compensation | Source: Ambulatory Visit | Attending: Orthopedic Surgery | Admitting: Orthopedic Surgery

## 2017-07-25 ENCOUNTER — Other Ambulatory Visit: Payer: Self-pay

## 2017-07-25 ENCOUNTER — Telehealth: Payer: Self-pay | Admitting: Nurse Practitioner

## 2017-07-25 ENCOUNTER — Ambulatory Visit: Payer: Self-pay | Admitting: Orthopedic Surgery

## 2017-07-25 DIAGNOSIS — Z01812 Encounter for preprocedural laboratory examination: Secondary | ICD-10-CM | POA: Diagnosis present

## 2017-07-25 DIAGNOSIS — Z01810 Encounter for preprocedural cardiovascular examination: Secondary | ICD-10-CM | POA: Insufficient documentation

## 2017-07-25 DIAGNOSIS — J449 Chronic obstructive pulmonary disease, unspecified: Secondary | ICD-10-CM | POA: Insufficient documentation

## 2017-07-25 HISTORY — DX: Nontoxic single thyroid nodule: E04.1

## 2017-07-25 HISTORY — DX: Emphysema, unspecified: J43.9

## 2017-07-25 LAB — CBC
HCT: 46.4 % (ref 35.0–47.0)
Hemoglobin: 15.3 g/dL (ref 12.0–16.0)
MCH: 28.7 pg (ref 26.0–34.0)
MCHC: 32.9 g/dL (ref 32.0–36.0)
MCV: 87.1 fL (ref 80.0–100.0)
PLATELETS: 250 10*3/uL (ref 150–440)
RBC: 5.32 MIL/uL — AB (ref 3.80–5.20)
RDW: 14 % (ref 11.5–14.5)
WBC: 9.6 10*3/uL (ref 3.6–11.0)

## 2017-07-25 NOTE — Patient Instructions (Addendum)
Your procedure is scheduled on: Monday, July 31, 2017 Report to Same Day Surgery on the 2nd floor in the [facility]. To find out your arrival time, please call [phone] between 1PM - 3PM on: Friday, July 28, 2017  REMEMBER: Instructions that are not followed completely may result in serious medical risk, up to and including death; or upon the discretion of your surgeon and anesthesiologist your surgery may need to be rescheduled.  Do not eat food after midnight the night before your procedure.  No gum chewing or hard candies.  You may however, drink CLEAR liquids up to 2 hours before you are scheduled to arrive at the hospital for your procedure.  Do not drink clear liquids within 2 hours of the start of your surgery.  Clear liquids include: - water  - apple juice without pulp - clear gatorade - black coffee or tea (Do NOT add anything to the coffee or tea) Do NOT drink anything that is not on this list.  No Alcohol for 24 hours before or after surgery.  No Smoking including e-cigarettes for 24 hours prior to surgery. No chewable tobacco products for at least 6 hours prior to surgery. No nicotine patches on the day of surgery.  Notify your doctor if there is any change in your medical condition (cold, fever, infection).  Do not wear jewelry, make-up, hairpins, clips or nail polish.  Do not wear lotions, powders, or perfumes. You may wear deodorant.  Do not shave 48 hours prior to surgery.   Contacts and dentures may not be worn into surgery.  Do not bring valuables to the hospital. [facility] not responsible for any belongings or valuables.   TAKE THESE MEDICATIONS THE MORNING OF SURGERY WITH A SIP OF WATER:  none  Use CHG Soap as directed on instruction sheet.  NOW!  Stop Anti-inflammatories such as Advil, Aleve, Ibuprofen, Motrin, Naproxen, Naprosyn, Goodie powder, or aspirin products. (May take Tylenol or Acetaminophen if needed.)  NOW!  Stop ANY OVER  THE COUNTER supplements until after surgery.   If you are being discharged the day of surgery, you will not be allowed to drive home. You will need someone to drive you home and stay with you that night.   If you are taking public transportation, you will need to have a responsible adult to with you.  Please call the number above if you have any questions about these instructions.

## 2017-07-25 NOTE — Telephone Encounter (Signed)
[facility] and they asked for most recent lab results to be faxied to [phone]

## 2017-07-30 MED ORDER — CEFAZOLIN SODIUM-DEXTROSE 2-4 GM/100ML-% IV SOLN
2.0000 g | INTRAVENOUS | Status: AC
  Administered 2017-07-31: 2 g via INTRAVENOUS

## 2017-07-31 ENCOUNTER — Ambulatory Visit: Payer: Worker's Compensation

## 2017-07-31 ENCOUNTER — Ambulatory Visit: Payer: Worker's Compensation | Admitting: Anesthesiology

## 2017-07-31 ENCOUNTER — Ambulatory Visit
Admission: RE | Admit: 2017-07-31 | Discharge: 2017-07-31 | Disposition: A | Discharge status: Home / Self Care | Payer: Worker's Compensation | Source: Ambulatory Visit | Attending: Orthopedic Surgery | Admitting: Orthopedic Surgery

## 2017-07-31 ENCOUNTER — Encounter
Admission: RE | Disposition: A | Discharge status: Home / Self Care | Payer: Self-pay | Source: Ambulatory Visit | Attending: Orthopedic Surgery

## 2017-07-31 DIAGNOSIS — M659 Synovitis and tenosynovitis, unspecified: Secondary | ICD-10-CM | POA: Diagnosis not present

## 2017-07-31 DIAGNOSIS — Y990 Civilian activity done for income or pay: Secondary | ICD-10-CM | POA: Diagnosis not present

## 2017-07-31 DIAGNOSIS — M75111 Incomplete rotator cuff tear or rupture of right shoulder, not specified as traumatic: Secondary | ICD-10-CM | POA: Insufficient documentation

## 2017-07-31 DIAGNOSIS — F17210 Nicotine dependence, cigarettes, uncomplicated: Secondary | ICD-10-CM | POA: Diagnosis not present

## 2017-07-31 DIAGNOSIS — X58XXXA Exposure to other specified factors, initial encounter: Secondary | ICD-10-CM | POA: Insufficient documentation

## 2017-07-31 DIAGNOSIS — Z79899 Other long term (current) drug therapy: Secondary | ICD-10-CM | POA: Insufficient documentation

## 2017-07-31 DIAGNOSIS — M24011 Loose body in right shoulder: Secondary | ICD-10-CM | POA: Insufficient documentation

## 2017-07-31 DIAGNOSIS — S46211A Strain of muscle, fascia and tendon of other parts of biceps, right arm, initial encounter: Secondary | ICD-10-CM | POA: Insufficient documentation

## 2017-07-31 DIAGNOSIS — M7581 Other shoulder lesions, right shoulder: Secondary | ICD-10-CM | POA: Insufficient documentation

## 2017-07-31 DIAGNOSIS — K219 Gastro-esophageal reflux disease without esophagitis: Secondary | ICD-10-CM | POA: Diagnosis not present

## 2017-07-31 DIAGNOSIS — Z791 Long term (current) use of non-steroidal anti-inflammatories (NSAID): Secondary | ICD-10-CM | POA: Insufficient documentation

## 2017-07-31 DIAGNOSIS — R0600 Dyspnea, unspecified: Secondary | ICD-10-CM

## 2017-07-31 DIAGNOSIS — M719 Bursopathy, unspecified: Secondary | ICD-10-CM | POA: Diagnosis not present

## 2017-07-31 DIAGNOSIS — M25511 Pain in right shoulder: Secondary | ICD-10-CM | POA: Diagnosis present

## 2017-07-31 DIAGNOSIS — J439 Emphysema, unspecified: Secondary | ICD-10-CM | POA: Insufficient documentation

## 2017-07-31 HISTORY — PX: SHOULDER ARTHROSCOPY WITH SUBACROMIAL DECOMPRESSION: SHX5684

## 2017-07-31 HISTORY — PX: SHOULDER ARTHROSCOPY WITH BICEPSTENOTOMY: SHX6204

## 2017-07-31 SURGERY — SHOULDER ARTHROSCOPY WITH BICEPS TENOTOMY
Anesthesia: General | Site: Shoulder | Laterality: Right | Wound class: Clean

## 2017-07-31 MED ORDER — GABAPENTIN 300 MG PO CAPS
300.0000 mg | ORAL_CAPSULE | Freq: Once | ORAL | Status: AC
  Administered 2017-07-31: 300 mg via ORAL

## 2017-07-31 MED ORDER — ROPIVACAINE HCL 2 MG/ML IJ SOLN
INTRAMUSCULAR | Status: AC
  Filled 2017-07-31: qty 20

## 2017-07-31 MED ORDER — OXYCODONE HCL 5 MG/5ML PO SOLN: 5.0000 mg | Freq: Once | ORAL | Status: AC | PRN

## 2017-07-31 MED ORDER — LIDOCAINE HCL (PF) 1 % IJ SOLN
INTRAMUSCULAR | Status: AC
  Filled 2017-07-31: qty 5

## 2017-07-31 MED ORDER — LIDOCAINE HCL (CARDIAC) 20 MG/ML IV SOLN
INTRAVENOUS | Status: DC | PRN
  Administered 2017-07-31: 40 mg via INTRAVENOUS

## 2017-07-31 MED ORDER — EPINEPHRINE 30 MG/30ML IJ SOLN
INTRAMUSCULAR | Status: AC
  Filled 2017-07-31: qty 1

## 2017-07-31 MED ORDER — FENTANYL CITRATE (PF) 100 MCG/2ML IJ SOLN
INTRAMUSCULAR | Status: DC | PRN
  Administered 2017-07-31 (×2): 50 ug via INTRAVENOUS

## 2017-07-31 MED ORDER — PROPOFOL 10 MG/ML IV BOLUS
INTRAVENOUS | Status: AC
  Filled 2017-07-31: qty 20

## 2017-07-31 MED ORDER — LACTATED RINGERS IV SOLN
INTRAVENOUS | Status: DC
  Administered 2017-07-31: 1000 mL via INTRAVENOUS

## 2017-07-31 MED ORDER — LIDOCAINE HCL (PF) 1 % IJ SOLN
INTRAMUSCULAR | Status: DC | PRN
  Administered 2017-07-31: 1 mL via INTRADERMAL

## 2017-07-31 MED ORDER — DEXAMETHASONE SODIUM PHOSPHATE 10 MG/ML IJ SOLN
INTRAMUSCULAR | Status: DC | PRN
  Administered 2017-07-31: 5 mg via INTRAVENOUS

## 2017-07-31 MED ORDER — ACETAMINOPHEN 500 MG PO TABS
1000.0000 mg | ORAL_TABLET | Freq: Once | ORAL | Status: AC
  Administered 2017-07-31: 1000 mg via ORAL

## 2017-07-31 MED ORDER — FAMOTIDINE 20 MG PO TABS
20.0000 mg | ORAL_TABLET | Freq: Once | ORAL | Status: AC
  Administered 2017-07-31: 20 mg via ORAL

## 2017-07-31 MED ORDER — MIDAZOLAM HCL 2 MG/2ML IJ SOLN
INTRAMUSCULAR | Status: DC | PRN
  Administered 2017-07-31: 1 mg via INTRAVENOUS

## 2017-07-31 MED ORDER — SUGAMMADEX SODIUM 200 MG/2ML IV SOLN
INTRAVENOUS | Status: AC
  Filled 2017-07-31: qty 2

## 2017-07-31 MED ORDER — PHENYLEPHRINE HCL 10 MG/ML IJ SOLN
INTRAMUSCULAR | Status: DC | PRN
  Administered 2017-07-31 (×2): 100 ug via INTRAVENOUS

## 2017-07-31 MED ORDER — EPINEPHRINE 30 MG/30ML IJ SOLN
INTRAMUSCULAR | Status: DC | PRN
  Administered 2017-07-31: 1 mg

## 2017-07-31 MED ORDER — OXYCODONE HCL 5 MG PO TABS
ORAL_TABLET | ORAL | Status: AC
  Filled 2017-07-31: qty 1

## 2017-07-31 MED ORDER — FENTANYL CITRATE (PF) 100 MCG/2ML IJ SOLN: 25.0000 ug | INTRAMUSCULAR | Status: DC | PRN

## 2017-07-31 MED ORDER — MIDAZOLAM HCL 2 MG/2ML IJ SOLN
1.0000 mg | Freq: Once | INTRAMUSCULAR | Status: AC
  Administered 2017-07-31: 1 mg via INTRAVENOUS

## 2017-07-31 MED ORDER — DOCUSATE SODIUM 100 MG PO CAPS: 100.0000 mg | ORAL_CAPSULE | Freq: Every day | ORAL | 2 refills | Status: DC | PRN

## 2017-07-31 MED ORDER — BUPIVACAINE-EPINEPHRINE (PF) 0.25% -1:200000 IJ SOLN
INTRAMUSCULAR | Status: AC
  Filled 2017-07-31: qty 30

## 2017-07-31 MED ORDER — SODIUM CHLORIDE 0.9 % IJ SOLN
INTRAMUSCULAR | Status: AC
  Filled 2017-07-31: qty 50

## 2017-07-31 MED ORDER — ROPIVACAINE HCL 5 MG/ML IJ SOLN
INTRAMUSCULAR | Status: DC | PRN
  Administered 2017-07-31: 20 mL via PERINEURAL
  Administered 2017-07-31: 10 mL via PERINEURAL

## 2017-07-31 MED ORDER — LIDOCAINE HCL (PF) 2 % IJ SOLN
INTRAMUSCULAR | Status: AC
  Filled 2017-07-31: qty 10

## 2017-07-31 MED ORDER — SUGAMMADEX SODIUM 200 MG/2ML IV SOLN
INTRAVENOUS | Status: DC | PRN
  Administered 2017-07-31: 200 mg via INTRAVENOUS

## 2017-07-31 MED ORDER — ONDANSETRON HCL 4 MG/2ML IJ SOLN
INTRAMUSCULAR | Status: DC | PRN
  Administered 2017-07-31: 4 mg via INTRAVENOUS

## 2017-07-31 MED ORDER — ROPIVACAINE HCL 5 MG/ML IJ SOLN
INTRAMUSCULAR | Status: AC
  Filled 2017-07-31: qty 30

## 2017-07-31 MED ORDER — MIDAZOLAM HCL 2 MG/2ML IJ SOLN
INTRAMUSCULAR | Status: AC
Start: 2017-07-31 — End: 2017-07-31
  Administered 2017-07-31: 1 mg via INTRAVENOUS
  Filled 2017-07-31: qty 2

## 2017-07-31 MED ORDER — OXYCODONE-ACETAMINOPHEN 5-325 MG PO TABS: 1.0000 | ORAL_TABLET | ORAL | 0 refills | Status: DC | PRN

## 2017-07-31 MED ORDER — GLYCOPYRROLATE 0.2 MG/ML IJ SOLN
INTRAMUSCULAR | Status: DC | PRN
  Administered 2017-07-31: 0.2 mg via INTRAVENOUS

## 2017-07-31 MED ORDER — FENTANYL CITRATE (PF) 100 MCG/2ML IJ SOLN
INTRAMUSCULAR | Status: AC
  Filled 2017-07-31: qty 2

## 2017-07-31 MED ORDER — FENTANYL CITRATE (PF) 100 MCG/2ML IJ SOLN
INTRAMUSCULAR | Status: AC
  Administered 2017-07-31: 50 ug via INTRAVENOUS
  Filled 2017-07-31: qty 2

## 2017-07-31 MED ORDER — ROCURONIUM BROMIDE 100 MG/10ML IV SOLN
INTRAVENOUS | Status: DC | PRN
  Administered 2017-07-31: 10 mg via INTRAVENOUS
  Administered 2017-07-31: 40 mg via INTRAVENOUS

## 2017-07-31 MED ORDER — MIDAZOLAM HCL 2 MG/2ML IJ SOLN
INTRAMUSCULAR | Status: AC
  Filled 2017-07-31: qty 2

## 2017-07-31 MED ORDER — BUPIVACAINE-EPINEPHRINE (PF) 0.25% -1:200000 IJ SOLN
INTRAMUSCULAR | Status: DC | PRN
  Administered 2017-07-31: 10 mL via PERINEURAL

## 2017-07-31 MED ORDER — ROPIVACAINE HCL 5 MG/ML IJ SOLN
INTRAMUSCULAR | Status: AC
  Filled 2017-07-31: qty 20

## 2017-07-31 MED ORDER — PHENYLEPHRINE HCL 10 MG/ML IJ SOLN
INTRAMUSCULAR | Status: DC | PRN
  Administered 2017-07-31: 20 ug/min via INTRAVENOUS

## 2017-07-31 MED ORDER — CHLORHEXIDINE GLUCONATE 4 % EX LIQD: 60.0000 mL | Freq: Once | CUTANEOUS | Status: DC

## 2017-07-31 MED ORDER — FENTANYL CITRATE (PF) 100 MCG/2ML IJ SOLN
50.0000 ug | Freq: Once | INTRAMUSCULAR | Status: AC
  Administered 2017-07-31: 50 ug via INTRAVENOUS

## 2017-07-31 MED ORDER — ONDANSETRON HCL 4 MG/2ML IJ SOLN
INTRAMUSCULAR | Status: AC
  Filled 2017-07-31: qty 2

## 2017-07-31 MED ORDER — EPHEDRINE SULFATE 50 MG/ML IJ SOLN
INTRAMUSCULAR | Status: DC | PRN
  Administered 2017-07-31: 5 mg via INTRAVENOUS

## 2017-07-31 MED ORDER — OXYCODONE HCL 5 MG PO TABS
5.0000 mg | ORAL_TABLET | Freq: Once | ORAL | Status: AC | PRN
  Administered 2017-07-31: 5 mg via ORAL

## 2017-07-31 MED ORDER — ROCURONIUM BROMIDE 50 MG/5ML IV SOLN
INTRAVENOUS | Status: AC
  Filled 2017-07-31: qty 1

## 2017-07-31 MED ORDER — DEXAMETHASONE SODIUM PHOSPHATE 10 MG/ML IJ SOLN
INTRAMUSCULAR | Status: AC
  Filled 2017-07-31: qty 1

## 2017-07-31 MED ORDER — LACTATED RINGERS IV SOLN: INTRAVENOUS | Status: DC

## 2017-07-31 MED ORDER — PROPOFOL 10 MG/ML IV BOLUS
INTRAVENOUS | Status: DC | PRN
  Administered 2017-07-31: 100 mg via INTRAVENOUS

## 2017-07-31 SURGICAL SUPPLY — 63 items
ADAPTER IRRIG TUBE 2 SPIKE SOL (ADAPTER) ×4 IMPLANT
BASIN GRAD PLASTIC 32OZ STRL (MISCELLANEOUS) ×4 IMPLANT
BLADE INCISOR PLUS 4.5 (BLADE) ×4 IMPLANT
BRUSH SCRUB EZ  4% CHG (MISCELLANEOUS) ×2
BRUSH SCRUB EZ 4% CHG (MISCELLANEOUS) ×2 IMPLANT
BUR BR 5.5 WIDE MOUTH (BURR) IMPLANT
CANNULA 5.75X7 CRYSTAL CLEAR (CANNULA) IMPLANT
CANNULA PARTIAL THREAD 2X7 (CANNULA) IMPLANT
CANNULA TWIST IN 8.25X9CM (CANNULA) ×4 IMPLANT
CHLORAPREP W/TINT 26ML (MISCELLANEOUS) ×4 IMPLANT
CLOSURE WOUND 1/4X4 (GAUZE/BANDAGES/DRESSINGS)
COOLER POLAR GLACIER W/PUMP (MISCELLANEOUS) IMPLANT
COVER MAYO STAND STRL (DRAPES) ×4 IMPLANT
CRADLE LAMINECT ARM (MISCELLANEOUS) ×4 IMPLANT
DEVICE SUCT BLK HOLE OR FLOOR (MISCELLANEOUS) IMPLANT
DRAPE IMP U-DRAPE 54X76 (DRAPES) ×8 IMPLANT
DRAPE INCISE IOBAN 66X45 STRL (DRAPES) ×4 IMPLANT
DRAPE SHEET LG 3/4 BI-LAMINATE (DRAPES) ×4 IMPLANT
DRAPE STERI 35X30 U-POUCH (DRAPES) ×4 IMPLANT
DRAPE U-SHAPE 47X51 STRL (DRAPES) ×4 IMPLANT
ELECT REM PT RETURN 9FT ADLT (ELECTROSURGICAL) ×4
ELECTRODE REM PT RTRN 9FT ADLT (ELECTROSURGICAL) ×2 IMPLANT
GAUZE PETRO XEROFOAM 1X8 (MISCELLANEOUS) ×4 IMPLANT
GAUZE SPONGE 4X4 12PLY STRL (GAUZE/BANDAGES/DRESSINGS) ×4 IMPLANT
GLOVE SURG ORTHO 8.0 STRL STRW (GLOVE) ×4 IMPLANT
GOWN STRL REUS W/ TWL LRG LVL3 (GOWN DISPOSABLE) ×2 IMPLANT
GOWN STRL REUS W/ TWL XL LVL3 (GOWN DISPOSABLE) ×2 IMPLANT
GOWN STRL REUS W/TWL LRG LVL3 (GOWN DISPOSABLE) ×2
GOWN STRL REUS W/TWL XL LVL3 (GOWN DISPOSABLE) ×2
IV LACTATED RINGER IRRG 3000ML (IV SOLUTION) ×16
IV LR IRRIG 3000ML ARTHROMATIC (IV SOLUTION) ×16 IMPLANT
KIT RM TURNOVER STRD PROC AR (KITS) ×4 IMPLANT
KIT STABILIZATION SHOULDER (MISCELLANEOUS) ×4 IMPLANT
MANIFOLD 4PT FOR NEPTUNE1 (MISCELLANEOUS) ×4 IMPLANT
MANIFOLD NEPTUNE II (INSTRUMENTS) ×4 IMPLANT
MASK FACE SPIDER DISP (MASK) ×4 IMPLANT
MAT BLUE FLOOR 46X72 FLO (MISCELLANEOUS) ×4 IMPLANT
NDL SAFETY ECLIPSE 18X1.5 (NEEDLE) ×2 IMPLANT
NDL SUREFIRE SCORPION (NEEDLE) IMPLANT
NEEDLE HYPO 18GX1.5 SHARP (NEEDLE) ×2
NEEDLE HYPO 22GX1.5 SAFETY (NEEDLE) ×4 IMPLANT
NEEDLE SPNL 18GX3.5 QUINCKE PK (NEEDLE) ×4 IMPLANT
PACK ARTHROSCOPY SHOULDER (MISCELLANEOUS) ×4 IMPLANT
PAD ABD DERMACEA PRESS 5X9 (GAUZE/BANDAGES/DRESSINGS) IMPLANT
PAD WRAPON POLAR SHDR XLG (MISCELLANEOUS) IMPLANT
SET TUBE SUCT SHAVER OUTFL 24K (TUBING) ×4 IMPLANT
SET TUBE TIP INTRA-ARTICULAR (MISCELLANEOUS) ×4 IMPLANT
SLING ARM LRG DEEP (SOFTGOODS) ×4 IMPLANT
SPONGE XRAY 4X4 16PLY STRL (MISCELLANEOUS) IMPLANT
STRAP SAFETY BODY (MISCELLANEOUS) ×4 IMPLANT
STRIP CLOSURE SKIN 1/4X4 (GAUZE/BANDAGES/DRESSINGS) IMPLANT
SUT ETHILON NAB PS2 4-0 18IN (SUTURE) ×4 IMPLANT
SUT FIBERWIRE #2 38 T-5 BLUE (SUTURE)
SUT PDS AB 0 CT1 27 (SUTURE) ×4 IMPLANT
SUT TIGER TAPE 7 IN WHITE (SUTURE) IMPLANT
SUTURE FIBERWR #2 38 T-5 BLUE (SUTURE) IMPLANT
SYR 10ML LL (SYRINGE) ×4 IMPLANT
TAPE MICROFOAM 4IN (TAPE) ×4 IMPLANT
TUBING ARTHRO INFLOW-ONLY STRL (TUBING) ×4 IMPLANT
TUBING CONNECTING 10 (TUBING) ×3 IMPLANT
TUBING CONNECTING 10' (TUBING) ×1
WAND HAND CNTRL MULTIVAC 90 (MISCELLANEOUS) ×4 IMPLANT
WRAPON POLAR PAD SHDR XLG (MISCELLANEOUS)

## 2017-07-31 NOTE — OR Nursing (Signed)
Pt c/o difficulty breathing, oxygen saturation 99-100%.  Dr. Randa NgoPiscitello into see pt.  Pt evaluated, discussed keeping pt overnight for observation, CXR ordered.  Pt will continue to be observed in post op.

## 2017-07-31 NOTE — Anesthesia Post-op Follow-up Note (Signed)
Anesthesia QCDR form completed.        

## 2017-07-31 NOTE — OR Nursing (Signed)
Pt given incentive Spirometer with verbal instructions, pt returned correct demonstration and acknowledge to use 10 times every 1-2 hours while awake.

## 2017-07-31 NOTE — Progress Notes (Signed)
Slight drainage noted on dressing  Reinforced before discharge

## 2017-07-31 NOTE — Anesthesia Procedure Notes (Signed)
Procedure Name: Intubation Date/Time: 07/31/2017 11:30 AM Performed by: Karoline CaldwellStarr, Alexianna Nachreiner, CRNA Pre-anesthesia Checklist: Patient identified, Patient being monitored, Timeout performed, Emergency Drugs available and Suction available Patient Re-evaluated:Patient Re-evaluated prior to induction Oxygen Delivery Method: Circle system utilized Preoxygenation: Pre-oxygenation with 100% oxygen Induction Type: IV induction Ventilation: Mask ventilation without difficulty Laryngoscope Size: 3 and McGraph Grade View: Grade I Tube type: Oral Tube size: 7.0 mm Number of attempts: 1 Airway Equipment and Method: Stylet Placement Confirmation: ETT inserted through vocal cords under direct vision,  positive ETCO2 and breath sounds checked- equal and bilateral Secured at: 21 cm Tube secured with: Tape Dental Injury: Teeth and Oropharynx as per pre-operative assessment

## 2017-07-31 NOTE — Progress Notes (Signed)
Molly Banks into do chest xray

## 2017-07-31 NOTE — Progress Notes (Signed)
Pt states feeling much better  Respirations even and steady   Color good  STATES READY TO GO HOME

## 2017-07-31 NOTE — Anesthesia Preprocedure Evaluation (Addendum)
Anesthesia Evaluation  Patient identified by MRN, date of birth, ID band Patient awake    Reviewed: Allergy & Precautions, H&P , NPO status , Patient's Chart, lab work & pertinent test results  History of Anesthesia Complications Negative for: history of anesthetic complications  Airway Mallampati: III  TM Distance: >3 FB Neck ROM: full    Dental  (+) Chipped, Poor Dentition, Missing, Edentulous Upper, Upper Dentures   Pulmonary COPD, Current Smoker,           Cardiovascular Exercise Tolerance: Good (-) angina(-) Past MI and (-) DOE negative cardio ROS       Neuro/Psych  Headaches,  Neuromuscular disease negative psych ROS   GI/Hepatic Neg liver ROS, GERD  Medicated and Controlled,  Endo/Other  negative endocrine ROS  Renal/GU      Musculoskeletal  (+) Arthritis ,   Abdominal   Peds  Hematology negative hematology ROS (+)   Anesthesia Other Findings Past Medical History: 2005: Breast lump in female No date: COPD (chronic obstructive pulmonary disease) (HCC) No date: Emphysema lung (HCC) No date: Erythrocytosis No date: Frequent headaches     Comment:  tension related No date: GERD (gastroesophageal reflux disease) No date: Hypoglycemia No date: Hypoglycemia No date: Leukocytosis No date: Thoracic scoliosis     Comment:  Left concavity No date: Thyroid nodule     Comment:  right side  Past Surgical History: No date: ABDOMINAL HYSTERECTOMY     Comment:  oophrectomy NOT perfomed No date: BREAST BIOPSY; Left     Comment:  [place] No date: CHOLECYSTECTOMY No date: TOE SURGERY; Left     Comment:  4th toe 1966: TONSILLECTOMY  BMI    Body Mass Index:  20.12 kg/m      Reproductive/Obstetrics negative OB ROS                             Anesthesia Physical Anesthesia Plan  ASA: III  Anesthesia Plan: General ETT   Post-op Pain Management: GA combined w/ Regional for  post-op pain   Induction: Intravenous  PONV Risk Score and Plan: 3 and Ondansetron, Dexamethasone and Midazolam  Airway Management Planned: Oral ETT  Additional Equipment:   Intra-op Plan:   Post-operative Plan: Extubation in OR and Possible Post-op intubation/ventilation  Informed Consent: I have reviewed the patients History and Physical, chart, labs and discussed the procedure including the risks, benefits and alternatives for the proposed anesthesia with the patient or authorized representative who has indicated his/her understanding and acceptance.   Dental Advisory Given  Plan Discussed with: Anesthesiologist, CRNA and Surgeon  Anesthesia Plan Comments: (Patient consented for risks of anesthesia including but not limited to:  - adverse reactions to medications - damage to teeth, lips or other oral mucosa - sore throat or hoarseness - Damage to heart, brain, lungs or loss of life  Patient voiced understanding.)       Anesthesia Quick Evaluation

## 2017-07-31 NOTE — Progress Notes (Signed)
Dr Henrene HawkingKephart  Notified of chest xray

## 2017-07-31 NOTE — Anesthesia Postprocedure Evaluation (Signed)
Anesthesia Post Note  Patient: Molly Banks  Procedure(s) Performed: SHOULDER ARTHROSCOPY WITH BICEPSTENOTOMY (Right Shoulder) SHOULDER ARTHROSCOPY WITH SUBACROMIAL DECOMPRESSION (Right Shoulder)  Patient location during evaluation: PACU Anesthesia Type: General Level of consciousness: awake and alert Pain management: pain level controlled Vital Signs Assessment: post-procedure vital signs reviewed and stable Respiratory status: spontaneous breathing, nonlabored ventilation, respiratory function stable and patient connected to nasal cannula oxygen Cardiovascular status: blood pressure returned to baseline and stable Postop Assessment: no apparent nausea or vomiting Anesthetic complications: no Comments: Patient had some dyspnea in phase 2 which resolved. She was 100% on room air, lungs were clear and CXR was read as normal.     Last Vitals:  Vitals:   07/31/17 1530 07/31/17 1558  BP: 131/85 140/80  Pulse: 67 63  Resp:  16  Temp:    SpO2: 99% 97%    Last Pain:  Vitals:   07/31/17 1430  TempSrc: Temporal  PainSc:                  Cleda MccreedyJoseph K Shonette Rhames

## 2017-07-31 NOTE — Anesthesia Procedure Notes (Signed)
Anesthesia Regional Block: Interscalene brachial plexus block   Pre-Anesthetic Checklist: ,, timeout performed, Correct Patient, Correct Site, Correct Laterality, Correct Procedure, Correct Position, site marked, Risks and benefits discussed,  Surgical consent,  Pre-op evaluation,  At surgeon's request and post-op pain management  Laterality: Upper and Right  Prep: chloraprep       Needles:  Injection technique: Single-shot  Needle Type: Stimiplex     Needle Length: 5cm  Needle Gauge: 22     Additional Needles:   Narrative:  Start time: 07/31/2017 9:28 AM End time: 07/31/2017 9:31 AM Injection made incrementally with aspirations every 5 mL.  Performed by: Personally  Anesthesiologist: Piscitello, Cleda MccreedyJoseph K, MD  Additional Notes: Functioning IV was confirmed and monitors were applied.  A 50mm 22ga Stimuplex needle was used. Sterile prep,hand hygiene and sterile gloves were used.  Minimal sedation used for procedure.  No paresthesia endorsed by patient during the procedure.  Negative aspiration and negative test dose prior to incremental administration of local anesthetic. The patient tolerated the procedure well with no immediate complications.

## 2017-07-31 NOTE — Transfer of Care (Signed)
Immediate Anesthesia Transfer of Care Note  Patient: Molly Banks  Procedure(s) Performed: SHOULDER ARTHROSCOPY WITH BICEPSTENOTOMY (Right Shoulder) SHOULDER ARTHROSCOPY WITH SUBACROMIAL DECOMPRESSION (Right Shoulder)  Patient Location: PACU  Anesthesia Type:General  Level of Consciousness: awake, alert  and oriented  Airway & Oxygen Therapy: Patient Spontanous Breathing and Patient connected to face mask oxygen  Post-op Assessment: Report given to RN and Post -op Vital signs reviewed and stable  Post vital signs: Reviewed and stable  Last Vitals:  Vitals:   07/31/17 1120 07/31/17 1327  BP:  (!) 115/51  Pulse: 69 67  Resp: 18 12  Temp:    SpO2: 99% 99%    Last Pain:  Vitals:   07/31/17 1120  TempSrc:   PainSc: 0-No pain         Complications: No apparent anesthesia complications

## 2017-07-31 NOTE — Discharge Instructions (Signed)
If dressing leaks, please remove and place band-aids Sling for comfort OK to use right arm for normal daily activities as tolerated

## 2017-07-31 NOTE — Progress Notes (Signed)
Ice pack to right upper extremity   Up to bathroom to void

## 2017-07-31 NOTE — Progress Notes (Signed)
Sling intact to right upper extremity

## 2017-07-31 NOTE — H&P (Signed)
The patient has been re-examined, and the chart reviewed, and there have been no interval changes to the documented history and physical.  Plan a right shoulder arthroscopic rotator cuff repair and biceps tenotomy today.  Anesthesia is consulted regarding a peripheral nerve block for post-operative pain.  The risks, benefits, and alternatives have been discussed at length, and the patient is willing to proceed.

## 2017-07-31 NOTE — Op Note (Signed)
07/31/2017  1:23 PM  PATIENT:  Molly Banks  56 y.o. 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    The risks benefits and alternatives were discussed with the patient preoperatively including but not limited to the risks of infection, bleeding, nerve injury, persistent pain or weakness, failure of the hardware, re-tear of the rotator cuff and the need for further surgery. Medical risks include DVT and pulmonary embolism, myocardial infarction, stroke, pneumonia, respiratory failure and death. Patient understood these risks and wished to proceed.  OPERATIVE IMPLANTS: None  OPERATIVE PROCEDURE: The patient was met in the preoperative area. The right shoulder was signed with my initials according the hospital's correct site of surgery protocol. The patient is brought to the OR and underwent a supraclavicular block and general endotracheal intubation by the anesthesia service.  The patient was placed in a beachchair position. A spider arm positioner was used for this case. Examination under anesthesia revealed normal stability and near full range of motion.  The patient was prepped and draped in a sterile fashion. A timeout was performed to verify the patient's name, date of birth, medical record number, correct site of surgery and  correct procedure to be performed there was also used to verify the patient received antibiotics that all appropriate instruments, implants and radiographs studies were available in the room. Once all in attendance were in agreement case began.  Bony landmarks were drawn out with a surgical marker along with proposed arthroscopy incisions. These were pre-injected with 1% lidocaine plain. An 11 blade was used to establish a posterior portal through which the arthroscope was placed in the glenohumeral joint. A full diagnostic examination of the shoulder was performed. The anterior portal was established under direct visualization with an 18-gauge spinal needle.  A 5.75 mm arthroscopic cannula was placed through the anterior portal.   The intra-articular portion of the biceps tendon was found to have a partial tear involving greater than 50% of the diameter along with severe synovitis. Therefore the decision was made to perform a tenotomy. Cautery was used to release the biceps tendon off the superior labrum. The arthroscopic shaver was then used to debride the frayed edges of the labrum. There was a small partial tear of the supraspinatus along the bursal side involving less than 25% of the tendon. The posterior cuff was intact.  Subscapularis tendon was intact. Small loose bodies were seen within the inferior recess and no evidence of HAGL lesion.  The arthroscope was then placed in the subacromial space. A lateral portal was then established using an 18-gauge spinal needle for localization.  A subacromial decompression was performed given the anterior bone spur and extensive bursitis. The bursal tissue was removed using a 4-0 resector shaver blade and a 90 ArthroCare wand from the lateral portal.  All incisions were copiously irrigated. Skin closure for the arthroscopic incisions was performed with 4-0 nylon.  A dry sterile dressing including Steri-Strips was applied . The patient was placed in a  sling.  All sharp and instrument counts were correct at the conclusion of the case. I was scrubbed and present for the entire case. I spoke with the patient's family in the post-op consultation room and informed them that the case had been performed without complication and the patient was stable in recovery room.   Skilled assistance was necessary throughout the case for appropriate arm positioning and exposure.  Kurtis Bushman, MD

## 2017-07-31 NOTE — Progress Notes (Signed)
Pt complains with shortness of breath   Oxygen saturate 100 percent  Dr. Sonnie AlamoPiscittello into see and chest xray ordered

## 2017-08-01 ENCOUNTER — Encounter: Payer: Self-pay | Admitting: Orthopedic Surgery

## 2018-02-12 ENCOUNTER — Ambulatory Visit: Payer: Self-pay | Admitting: Orthopedic Surgery

## 2018-02-16 ENCOUNTER — Encounter
Admission: RE | Admit: 2018-02-16 | Discharge: 2018-02-16 | Disposition: A | Discharge status: Home / Self Care | Payer: Self-pay | Source: Ambulatory Visit | Attending: Orthopedic Surgery | Admitting: Orthopedic Surgery

## 2018-02-16 ENCOUNTER — Other Ambulatory Visit: Payer: Self-pay

## 2018-02-16 HISTORY — DX: Unspecified osteoarthritis, unspecified site: M19.90

## 2018-02-16 NOTE — Patient Instructions (Signed)
[facility]: [facility] 2nd floor medical mall [facility] 2(Medical Mall Entrance-take elevator on left to 2nd floor.  Check in with surgery information desk.) To find out your arrival time please call [phone] between 1PM - 3PM on 02-20-18 TUESDAY  Remember: Instructions that are not followed completely may result in serious medical risk, up to and including death, or upon the discretion of your surgeon and anesthesiologist your surgery may need to be rescheduled.    _x___ 1. Do not eat food after midnight the night before your procedure. NO GUM OR CANDY AFTER MIDNIGHT.  You may drink clear liquids up to 2 hours before you are scheduled to arrive at the hospital for your procedure.  Do not drink clear liquids within 2 hours of your scheduled arrival to the hospital.  Clear liquids include  --Water or Apple juice without pulp  --Clear carbohydrate beverage such as ClearFast or Gatorade  --Black Coffee or Clear Tea (No milk, no creamers, do not add anything to the coffee or Tea     __x__ 2. No Alcohol for 24 hours before or after surgery.   __x__3. No Smoking or e-cigarettes for 24 prior to surgery.  Do not use any chewable tobacco products for at least 6 hour prior to surgery   ____  4. Bring all medications with you on the day of surgery if instructed.    __x__ 5. Notify your doctor if there is any change in your medical condition     (cold, fever, infections).    x___6. On the morning of surgery brush your teeth with toothpaste and water.  You may rinse your mouth with mouth wash if you wish.  Do not swallow any toothpaste or mouthwash.   Do not wear jewelry, make-up, hairpins, clips or nail polish.  Do not wear lotions, powders, or perfumes. You may wear deodorant.  Do not shave 48 hours prior to surgery. Men may shave face and neck.  Do not bring valuables to the hospital.    [facility] is not responsible for any belongings or  valuables.               Contacts, dentures or bridgework may not be worn into surgery.  Leave your suitcase in the car. After surgery it may be brought to your room.  For patients admitted to the hospital, discharge time is determined by your treatment team.  _  Patients discharged the day of surgery will not be allowed to drive home.  You will need someone to drive you home and stay with you the night of your procedure.    Please read over the following fact sheets that you were given:   [facility]   ____ Take anti-hypertensive listed below, cardiac, seizure, asthma, anti-reflux and psychiatric medicines. These include:  1. NONE  2.  3.  4.  5.  6.  ____Fleets enema or Magnesium Citrate as directed.   ____ Use CHG Soap or sage wipes as directed on instruction sheet   ____ Use inhalers on the day of surgery and bring to hospital day of surgery  ____ Stop Metformin and Janumet 2 days prior to surgery.    ____ Take 1/2 of usual insulin dose the night before surgery and none on the morning surgery.   ____ Follow recommendations from Cardiologist, Pulmonologist or PCP regarding stopping Aspirin, Coumadin, Plavix ,Eliquis, Effient, or Pradaxa, and Pletal.  X____Stop Anti-inflammatories such as Advil,  Aleve, Ibuprofen, Motrin, Naproxen, Naprosyn, Goodies powders or aspirin products NOW- OK to take Tylenol   ____ Stop supplements until after surgery.     ____ Bring C-Pap to the hospital.

## 2018-02-19 ENCOUNTER — Encounter
Admission: RE | Admit: 2018-02-19 | Discharge: 2018-02-19 | Disposition: A | Discharge status: Home / Self Care | Payer: Worker's Compensation | Source: Ambulatory Visit | Attending: Orthopedic Surgery | Admitting: Orthopedic Surgery

## 2018-02-19 DIAGNOSIS — Z79899 Other long term (current) drug therapy: Secondary | ICD-10-CM | POA: Diagnosis not present

## 2018-02-19 DIAGNOSIS — F17210 Nicotine dependence, cigarettes, uncomplicated: Secondary | ICD-10-CM | POA: Diagnosis not present

## 2018-02-19 DIAGNOSIS — J449 Chronic obstructive pulmonary disease, unspecified: Secondary | ICD-10-CM | POA: Diagnosis not present

## 2018-02-19 DIAGNOSIS — G709 Myoneural disorder, unspecified: Secondary | ICD-10-CM | POA: Diagnosis not present

## 2018-02-19 DIAGNOSIS — M13811 Other specified arthritis, right shoulder: Secondary | ICD-10-CM | POA: Diagnosis present

## 2018-02-19 LAB — CBC
HCT: 45.3 % (ref 35.0–47.0)
Hemoglobin: 15.4 g/dL (ref 12.0–16.0)
MCH: 29.7 pg (ref 26.0–34.0)
MCHC: 34 g/dL (ref 32.0–36.0)
MCV: 87.3 fL (ref 80.0–100.0)
Platelets: 266 10*3/uL (ref 150–440)
RBC: 5.18 MIL/uL (ref 3.80–5.20)
RDW: 13.8 % (ref 11.5–14.5)
WBC: 10.3 10*3/uL (ref 3.6–11.0)

## 2018-02-19 LAB — BASIC METABOLIC PANEL
ANION GAP: 8 (ref 5–15)
BUN: 7 mg/dL (ref 6–20)
CALCIUM: 9.4 mg/dL (ref 8.9–10.3)
CO2: 27 mmol/L (ref 22–32)
CREATININE: 0.45 mg/dL (ref 0.44–1.00)
Chloride: 105 mmol/L (ref 98–111)
GFR calc Af Amer: >60 mL/min (ref >60)
Glucose, Bld: 77 mg/dL (ref 70–99)
Potassium: 3.7 mmol/L (ref 3.5–5.1)
SODIUM: 140 mmol/L (ref 135–145)

## 2018-02-20 MED ORDER — CEFAZOLIN SODIUM-DEXTROSE 2-4 GM/100ML-% IV SOLN
2.0000 g | INTRAVENOUS | Status: AC
  Administered 2018-02-21: 2 g via INTRAVENOUS

## 2018-02-21 ENCOUNTER — Ambulatory Visit: Payer: Worker's Compensation | Admitting: Anesthesiology

## 2018-02-21 ENCOUNTER — Ambulatory Visit
Admission: RE | Admit: 2018-02-21 | Discharge: 2018-02-21 | Disposition: A | Discharge status: Home / Self Care | Payer: Worker's Compensation | Source: Ambulatory Visit | Attending: Orthopedic Surgery | Admitting: Orthopedic Surgery

## 2018-02-21 ENCOUNTER — Other Ambulatory Visit: Payer: Self-pay

## 2018-02-21 ENCOUNTER — Encounter
Admission: RE | Disposition: A | Discharge status: Home / Self Care | Payer: Self-pay | Source: Ambulatory Visit | Attending: Orthopedic Surgery

## 2018-02-21 DIAGNOSIS — M13811 Other specified arthritis, right shoulder: Secondary | ICD-10-CM | POA: Insufficient documentation

## 2018-02-21 DIAGNOSIS — J449 Chronic obstructive pulmonary disease, unspecified: Secondary | ICD-10-CM | POA: Insufficient documentation

## 2018-02-21 DIAGNOSIS — F17210 Nicotine dependence, cigarettes, uncomplicated: Secondary | ICD-10-CM | POA: Insufficient documentation

## 2018-02-21 DIAGNOSIS — G709 Myoneural disorder, unspecified: Secondary | ICD-10-CM | POA: Insufficient documentation

## 2018-02-21 DIAGNOSIS — Z79899 Other long term (current) drug therapy: Secondary | ICD-10-CM | POA: Insufficient documentation

## 2018-02-21 SURGERY — SHOULDER ARTHROSCOPY WITH SUBACROMIAL DECOMPRESSION AND DISTAL CLAVICLE EXCISION
Anesthesia: General | Site: Shoulder | Laterality: Right | Wound class: "Clean "

## 2018-02-21 MED ORDER — HYDROCODONE-ACETAMINOPHEN 5-325 MG PO TABS
1.0000 | ORAL_TABLET | Freq: Once | ORAL | Status: AC
  Administered 2018-02-21: 1 via ORAL

## 2018-02-21 MED ORDER — MIDAZOLAM HCL 2 MG/2ML IJ SOLN
INTRAMUSCULAR | Status: AC
  Filled 2018-02-21: qty 2

## 2018-02-21 MED ORDER — LACTATED RINGERS IV SOLN
INTRAVENOUS | Status: DC | PRN
  Administered 2018-02-21: 4 mL

## 2018-02-21 MED ORDER — ROCURONIUM BROMIDE 100 MG/10ML IV SOLN
INTRAVENOUS | Status: DC | PRN
  Administered 2018-02-21: 40 mg via INTRAVENOUS
  Administered 2018-02-21: 10 mg via INTRAVENOUS

## 2018-02-21 MED ORDER — LACTATED RINGERS IV SOLN
INTRAVENOUS | Status: DC
  Administered 2018-02-21: 19:00:00 via INTRAVENOUS

## 2018-02-21 MED ORDER — FENTANYL CITRATE (PF) 100 MCG/2ML IJ SOLN
INTRAMUSCULAR | Status: DC | PRN
  Administered 2018-02-21: 50 ug via INTRAVENOUS

## 2018-02-21 MED ORDER — LIDOCAINE HCL (CARDIAC) PF 100 MG/5ML IV SOSY
PREFILLED_SYRINGE | INTRAVENOUS | Status: DC | PRN
  Administered 2018-02-21: 100 mg via INTRAVENOUS

## 2018-02-21 MED ORDER — ROPIVACAINE HCL 5 MG/ML IJ SOLN
INTRAMUSCULAR | Status: AC
  Filled 2018-02-21: qty 30

## 2018-02-21 MED ORDER — MIDAZOLAM HCL 2 MG/2ML IJ SOLN
1.0000 mg | Freq: Once | INTRAMUSCULAR | Status: AC
  Administered 2018-02-21: 1 mg via INTRAVENOUS

## 2018-02-21 MED ORDER — ROPIVACAINE HCL 5 MG/ML IJ SOLN
INTRAMUSCULAR | Status: DC | PRN
  Administered 2018-02-21: 20 mL via EPIDURAL

## 2018-02-21 MED ORDER — GABAPENTIN 300 MG PO CAPS
ORAL_CAPSULE | ORAL | Status: AC
  Administered 2018-02-21: 300 mg via ORAL
  Filled 2018-02-21: qty 1

## 2018-02-21 MED ORDER — ACETAMINOPHEN 500 MG PO TABS
ORAL_TABLET | ORAL | Status: AC
  Administered 2018-02-21: 1000 mg via ORAL
  Filled 2018-02-21: qty 2

## 2018-02-21 MED ORDER — FAMOTIDINE 20 MG PO TABS
ORAL_TABLET | ORAL | Status: AC
  Administered 2018-02-21: 20 mg via ORAL
  Filled 2018-02-21: qty 1

## 2018-02-21 MED ORDER — SUGAMMADEX SODIUM 200 MG/2ML IV SOLN
INTRAVENOUS | Status: DC | PRN
  Administered 2018-02-21: 102.2 mg via INTRAVENOUS

## 2018-02-21 MED ORDER — PHENYLEPHRINE HCL 10 MG/ML IJ SOLN
INTRAMUSCULAR | Status: DC | PRN
  Administered 2018-02-21: 50 ug/min via INTRAVENOUS

## 2018-02-21 MED ORDER — ONDANSETRON HCL 4 MG/2ML IJ SOLN
INTRAMUSCULAR | Status: DC | PRN
  Administered 2018-02-21: 4 mg via INTRAVENOUS

## 2018-02-21 MED ORDER — FENTANYL CITRATE (PF) 100 MCG/2ML IJ SOLN
INTRAMUSCULAR | Status: AC
  Filled 2018-02-21: qty 2

## 2018-02-21 MED ORDER — HYDROCODONE-ACETAMINOPHEN 5-325 MG PO TABS
ORAL_TABLET | ORAL | Status: AC
  Filled 2018-02-21: qty 1

## 2018-02-21 MED ORDER — FENTANYL CITRATE (PF) 100 MCG/2ML IJ SOLN
25.0000 ug | INTRAMUSCULAR | Status: DC | PRN
  Administered 2018-02-21: 25 ug via INTRAVENOUS

## 2018-02-21 MED ORDER — GABAPENTIN 300 MG PO CAPS
300.0000 mg | ORAL_CAPSULE | Freq: Once | ORAL | Status: AC
  Administered 2018-02-21: 300 mg via ORAL

## 2018-02-21 MED ORDER — DEXAMETHASONE SODIUM PHOSPHATE 10 MG/ML IJ SOLN
INTRAMUSCULAR | Status: DC | PRN
  Administered 2018-02-21: 10 mg via INTRAVENOUS

## 2018-02-21 MED ORDER — ACETAMINOPHEN 500 MG PO TABS
1000.0000 mg | ORAL_TABLET | Freq: Once | ORAL | Status: AC
  Administered 2018-02-21: 1000 mg via ORAL

## 2018-02-21 MED ORDER — MIDAZOLAM HCL 2 MG/2ML IJ SOLN
INTRAMUSCULAR | Status: AC
  Administered 2018-02-21: 1 mg via INTRAVENOUS
  Filled 2018-02-21: qty 2

## 2018-02-21 MED ORDER — FAMOTIDINE 20 MG PO TABS
20.0000 mg | ORAL_TABLET | Freq: Once | ORAL | Status: AC
  Administered 2018-02-21: 20 mg via ORAL

## 2018-02-21 MED ORDER — HYDROCODONE-ACETAMINOPHEN 5-325 MG PO TABS: 1.0000 | ORAL_TABLET | ORAL | 0 refills | Status: DC | PRN

## 2018-02-21 MED ORDER — MIDAZOLAM HCL 2 MG/2ML IJ SOLN
INTRAMUSCULAR | Status: DC | PRN
  Administered 2018-02-21: 2 mg via INTRAVENOUS

## 2018-02-21 MED ORDER — LIDOCAINE HCL (PF) 1 % IJ SOLN
INTRAMUSCULAR | Status: AC
  Filled 2018-02-21: qty 5

## 2018-02-21 MED ORDER — CHLORHEXIDINE GLUCONATE 4 % EX LIQD: 60.0000 mL | Freq: Once | CUTANEOUS | Status: DC

## 2018-02-21 MED ORDER — LACTATED RINGERS IV SOLN
INTRAVENOUS | Status: DC
  Administered 2018-02-21: 13:00:00 via INTRAVENOUS

## 2018-02-21 MED ORDER — PROPOFOL 10 MG/ML IV BOLUS
INTRAVENOUS | Status: DC | PRN
  Administered 2018-02-21: 130 mg via INTRAVENOUS

## 2018-02-21 MED ORDER — ONDANSETRON HCL 4 MG/2ML IJ SOLN: 4.0000 mg | Freq: Once | INTRAMUSCULAR | Status: DC | PRN

## 2018-02-21 MED ORDER — BUPIVACAINE-EPINEPHRINE (PF) 0.25% -1:200000 IJ SOLN
INTRAMUSCULAR | Status: DC | PRN
  Administered 2018-02-21: 30 mL

## 2018-02-21 MED ORDER — CEFAZOLIN SODIUM-DEXTROSE 2-4 GM/100ML-% IV SOLN
INTRAVENOUS | Status: AC
  Filled 2018-02-21: qty 100

## 2018-02-21 MED ORDER — FENTANYL CITRATE (PF) 100 MCG/2ML IJ SOLN
50.0000 ug | Freq: Once | INTRAMUSCULAR | Status: AC
  Administered 2018-02-21: 50 ug via INTRAVENOUS

## 2018-02-21 MED ORDER — PHENYLEPHRINE HCL 10 MG/ML IJ SOLN
INTRAMUSCULAR | Status: DC | PRN
  Administered 2018-02-21 (×3): 100 ug via INTRAVENOUS

## 2018-02-21 MED ORDER — FENTANYL CITRATE (PF) 100 MCG/2ML IJ SOLN
INTRAMUSCULAR | Status: AC
  Administered 2018-02-21: 50 ug via INTRAVENOUS
  Filled 2018-02-21: qty 2

## 2018-02-21 SURGICAL SUPPLY — 50 items
ADAPTER IRRIG TUBE 2 SPIKE SOL (ADAPTER) ×6 IMPLANT
BLADE FULL RADIUS 3.5 (BLADE) ×3 IMPLANT
BLADE INCISOR PLUS 4.5 (BLADE) ×1 IMPLANT
BLADE SURG MINI STRL (BLADE) IMPLANT
BRUSH SCRUB EZ  4% CHG (MISCELLANEOUS) ×2
BRUSH SCRUB EZ 4% CHG (MISCELLANEOUS) ×1 IMPLANT
BUR ACROMIONIZER 4.0 (BURR) ×2 IMPLANT
BUR BR 5.5 WIDE MOUTH (BURR) IMPLANT
CANNULA 5.75X7 CRYSTAL CLEAR (CANNULA) ×1 IMPLANT
CANNULA PARTIAL THREAD 2X7 (CANNULA) ×1 IMPLANT
CANNULA SHOULDER 7CM (CANNULA) ×2 IMPLANT
CANNULA TWIST IN 8.25X9CM (CANNULA) ×1 IMPLANT
COOLER POLAR GLACIER W/PUMP (MISCELLANEOUS) ×3 IMPLANT
CRADLE LAMINECT ARM (MISCELLANEOUS) ×6 IMPLANT
DRAPE IMP U-DRAPE 54X76 (DRAPES) ×6 IMPLANT
DRAPE INCISE IOBAN 66X45 STRL (DRAPES) ×3 IMPLANT
DRAPE SHEET LG 3/4 BI-LAMINATE (DRAPES) ×3 IMPLANT
DRAPE STERI 35X30 U-POUCH (DRAPES) ×3 IMPLANT
DRAPE U-SHAPE 47X51 STRL (DRAPES) ×3 IMPLANT
GAUZE PETRO XEROFOAM 1X8 (MISCELLANEOUS) ×3 IMPLANT
GAUZE SPONGE 4X4 12PLY STRL (GAUZE/BANDAGES/DRESSINGS) ×3 IMPLANT
GLOVE BIOGEL PI IND STRL 8 (GLOVE) ×1 IMPLANT
GLOVE BIOGEL PI INDICATOR 8 (GLOVE) ×2
GLOVE SURG ORTHO 8.0 STRL STRW (GLOVE) ×3 IMPLANT
GOWN STRL REUS W/ TWL LRG LVL3 (GOWN DISPOSABLE) ×2 IMPLANT
GOWN STRL REUS W/TWL LRG LVL3 (GOWN DISPOSABLE) ×4
IV LACTATED RINGER IRRG 3000ML (IV SOLUTION) ×12
IV LR IRRIG 3000ML ARTHROMATIC (IV SOLUTION) ×6 IMPLANT
KIT STABILIZATION SHOULDER (MISCELLANEOUS) ×3 IMPLANT
KIT TURNOVER KIT A (KITS) ×3 IMPLANT
MANIFOLD NEPTUNE II (INSTRUMENTS) ×3 IMPLANT
MASK FACE SPIDER DISP (MASK) ×3 IMPLANT
MAT BLUE FLOOR 46X72 FLO (MISCELLANEOUS) ×6 IMPLANT
NDL SPNL 18GX3.5 QUINCKE PK (NEEDLE) ×1 IMPLANT
NEEDLE HYPO 22GX1.5 SAFETY (NEEDLE) ×3 IMPLANT
NEEDLE SPNL 18GX3.5 QUINCKE PK (NEEDLE) ×3 IMPLANT
PACK ARTHROSCOPY SHOULDER (MISCELLANEOUS) ×3 IMPLANT
PAD ABD DERMACEA PRESS 5X9 (GAUZE/BANDAGES/DRESSINGS) IMPLANT
PAD WRAPON POLAR SHDR XLG (MISCELLANEOUS) ×1 IMPLANT
SLING ULTRA II LG (MISCELLANEOUS) ×3 IMPLANT
STRAP SAFETY 5IN WIDE (MISCELLANEOUS) ×3 IMPLANT
SUT ETHILON NAB PS2 4-0 18IN (SUTURE) ×3 IMPLANT
SYR 10ML LL (SYRINGE) ×3 IMPLANT
SYR 50ML LL SCALE MARK (SYRINGE) ×3 IMPLANT
TAPE MICROFOAM 4IN (TAPE) ×3 IMPLANT
TUBING ARTHRO INFLOW-ONLY STRL (TUBING) ×3 IMPLANT
TUBING CONNECTING 10 (TUBING) ×2 IMPLANT
TUBING CONNECTING 10' (TUBING) ×1
WAND HAND CNTRL MULTIVAC 90 (MISCELLANEOUS) ×3 IMPLANT
WRAPON POLAR PAD SHDR XLG (MISCELLANEOUS) ×3

## 2018-02-21 NOTE — Anesthesia Procedure Notes (Addendum)
Procedure Name: Intubation Date/Time: 02/21/2018 4:26 PM Performed by: Nelda Marseille, CRNA Pre-anesthesia Checklist: Patient identified, Patient being monitored, Timeout performed, Emergency Drugs available and Suction available Patient Re-evaluated:Patient Re-evaluated prior to induction Oxygen Delivery Method: Circle system utilized Preoxygenation: Pre-oxygenation with 100% oxygen Induction Type: IV induction Ventilation: Mask ventilation without difficulty Laryngoscope Size: Mac, 3 and McGraph Grade View: Grade III Tube type: Oral Tube size: 7.0 mm Number of attempts: 1 Airway Equipment and Method: Stylet and Video-laryngoscopy Placement Confirmation: ETT inserted through vocal cords under direct vision,  positive ETCO2 and breath sounds checked- equal and bilateral Secured at: 21 cm Tube secured with: Tape Dental Injury: Teeth and Oropharynx as per pre-operative assessment  Difficulty Due To: Difficulty was anticipated and Difficult Airway- due to anterior larynx

## 2018-02-21 NOTE — Anesthesia Postprocedure Evaluation (Signed)
Anesthesia Post Note  Patient: Molly Banks  Procedure(s) Performed: SHOULDER ARTHROSCOPY WITH SUBACROMIAL DECOMPRESSION AND DISTAL CLAVICLE EXCISION (Right Shoulder)  Patient location during evaluation: PACU Anesthesia Type: General Level of consciousness: awake and alert Pain management: pain level controlled Vital Signs Assessment: post-procedure vital signs reviewed and stable Respiratory status: spontaneous breathing, nonlabored ventilation, respiratory function stable and patient connected to nasal cannula oxygen Cardiovascular status: blood pressure returned to baseline and stable Postop Assessment: no apparent nausea or vomiting Anesthetic complications: no     Last Vitals:  Vitals:   02/21/18 1825 02/21/18 1835  BP:  118/64  Pulse: 76 61  Resp: 20 14  Temp:    SpO2: 95% 95%    Last Pain:  Vitals:   02/21/18 1835  TempSrc:   PainSc: 0-No pain                 Yevette EdwardsJames G Maureen Delatte

## 2018-02-21 NOTE — Op Note (Signed)
02/21/2018  5:53 PM  PATIENT:  Molly Banks A Geeslin    PRE-OPERATIVE DIAGNOSIS:  Arthritis of acromioclavicular joint, right shoulder  POST-OPERATIVE DIAGNOSIS:  Same  PROCEDURE:  SHOULDER ARTHROSCOPY WITH SUBACROMIAL DECOMPRESSION AND DISTAL CLAVICLE EXCISION, RIGHT  SURGEON:  Lovell Sheehan, MD  ANESTHESIA:   General and regional block  PREOPERATIVE INDICATIONS:  [name] is a  57 y.o. female with a diagnosis of arthritis of acromioclavicular joint who failed conservative measures and elected for surgical management.    I discussed the risks and benefits of surgery. The risks include but are not limited to infection, bleeding requiring blood transfusion, nerve or blood vessel injury, joint stiffness or loss of motion, persistent pain, weakness or instability, malunion, nonunion and hardware failure and the need for further surgery. Medical risks include but are not limited to DVT and pulmonary embolism, myocardial infarction, stroke, pneumonia, respiratory failure and death. Patient understood these risks and wished to proceed.   OPERATIVE FINDINGS: Abundant bursal tissue was found in the subacromial space. There was signicant spurring and loss of cartilage at the distal clavicle. The rotator cuff moved as a unit.  OPERATIVE PROCEDURE: The patient was met in the preoperative area. The left shoulder was signed with the word yes and my initials according the hospital's correct site of surgery protocol.  History and physical was updated. Patient was brought to the operating room where he underwent interscalene block and general anesthesia. The patient was placed in a beachchair position.  A spider arm positioner was used for this case. Examination under anesthesia revealed no limitation of motion or instability with load shift testing. The patient had a negative sulcus sign.  Patient was prepped and draped in a sterile fashion. A timeout was performed to verify the patient's name, date of birth,  medical record number, correct site of surgery and correct procedure to be performed there was also used to verify the patient received antibiotics that all appropriate instruments, implants and radiographs studies were available in the room. Once all in attendance were in agreement case began.  Bony landmarks were drawn out with a surgical marker along with proposed arthroscopy incisions.   The arthroscope was then placed in the subacromial space.  Extensive bursitis was encountered. A lateral portal was established with an 18-gauge spinal needle for localization. A 90 ArthroCare wand and shaver blade were used to form an extensive subdeltoid and subacromial bursectomy. There was no evidence of a bursal sided tear of the supra or infraspinatus.   A subacromial decompression was performed using a 4.0 mm Acromionizer from the lateral portal. The 4.0 mm Acromionizer was then placed through the anterior portal.  A distal clavicle excision was performed. Subacromial space was then copiously irrigated to remove all osseous debris. Final arthroscopic images were taken. Arthroscopic instruments were then removed.  Skin closure for the arthroscopic incisions was performed with 4-0 nylon.  A dry sterile dressing was applied.  The patient was placed in a sling.  All sharp and it instrument counts were correct at the conclusion of the case. I was scrubbed and present for the entire case. I spoke with the patient's family postoperatively to let them know the case had been performed without complication and the patient was stable in recovery room.

## 2018-02-21 NOTE — Transfer of Care (Signed)
Immediate Anesthesia Transfer of Care Note  Patient: Molly Banks  Procedure(s) Performed: SHOULDER ARTHROSCOPY WITH SUBACROMIAL DECOMPRESSION AND DISTAL CLAVICLE EXCISION (Right Shoulder)  Patient Location: PACU  Anesthesia Type:General  Level of Consciousness: sedated  Airway & Oxygen Therapy: Patient Spontanous Breathing and Patient connected to face mask oxygen  Post-op Assessment: Report given to RN and Post -op Vital signs reviewed and stable  Post vital signs: Reviewed and stable  Last Vitals:  Vitals Value Taken Time  BP    Temp    Pulse    Resp    SpO2      Last Pain:  Vitals:   02/21/18 1500  TempSrc:   PainSc: 0-No pain         Complications: No apparent anesthesia complications

## 2018-02-21 NOTE — Anesthesia Preprocedure Evaluation (Signed)
Anesthesia Evaluation  Patient identified by MRN, date of birth, ID band Patient awake    Reviewed: Allergy & Precautions, NPO status , Patient's Chart, lab work & pertinent test results, reviewed documented beta blocker date and time   Airway Mallampati: II  TM Distance: >3 FB     Dental  (+) Chipped   Pulmonary COPD, Current Smoker,           Cardiovascular      Neuro/Psych  Headaches,  Neuromuscular disease    GI/Hepatic GERD  Controlled,  Endo/Other    Renal/GU      Musculoskeletal  (+) Arthritis ,   Abdominal   Peds  Hematology   Anesthesia Other Findings   Reproductive/Obstetrics                             Anesthesia Physical Anesthesia Plan  ASA: III  Anesthesia Plan: General   Post-op Pain Management:    Induction: Intravenous  PONV Risk Score and Plan:   Airway Management Planned: Oral ETT  Additional Equipment:   Intra-op Plan:   Post-operative Plan:   Informed Consent: I have reviewed the patients History and Physical, chart, labs and discussed the procedure including the risks, benefits and alternatives for the proposed anesthesia with the patient or authorized representative who has indicated his/her understanding and acceptance.     Plan Discussed with: CRNA  Anesthesia Plan Comments:         Anesthesia Quick Evaluation

## 2018-02-21 NOTE — H&P (Signed)
The patient has been re-examined, and the chart reviewed, and there have been no interval changes to the documented history and physical.  Plan a right shoulder arthroscopic distal clavicle excision today.  Anesthesia is consulted regarding a peripheral nerve block for post-operative pain.  The risks, benefits, and alternatives have been discussed at length, and the patient is willing to proceed.

## 2018-02-21 NOTE — Discharge Instructions (Signed)
AMBULATORY SURGERY  DISCHARGE INSTRUCTIONS   1) The drugs that you were given will stay in your system until tomorrow so for the next 24 hours you should not:  A) Drive an automobile B) Make any legal decisions C) Drink any alcoholic beverage   2) You may resume regular meals tomorrow.  Today it is better to start with liquids and gradually work up to solid foods.  You may eat anything you prefer, but it is better to start with liquids, then soup and crackers, and gradually work up to solid foods.   3) Please notify your doctor immediately if you have any unusual bleeding, trouble breathing, redness and pain at the surgery site, drainage, fever, or pain not relieved by medication.    4) Additional Instructions:        Please contact your physician with any problems or Same Day Surgery at [phone], Monday through Friday 6 am to 4 pm, or [place] [facility] number at [phone].Wear sling at all times, including sleep.  You will need to use the sling for a total of 4 weeks following surgery.  May use your arm as tolerated. No lifting.  Keep the dressing dry.  You may remove bandage in 3 days.  You may place Band-Aids over top of the incisions.  May shower once dressing is removed in 3 days.  Remove sling carefully only for showers, leaving arm down by your side while in the shower.  +++ Make sure to take some pain medication this evening before you fall asleep, in preparation for the nerve block wearing off in the middle of the night.  If the the pain medication causes itching, or is too strong, try taking a single tablet at a time, or combining with Benadryl.  You may be most comfortable sleeping in a recliner.  If you do sleep in near bed, placed pillows behind the shoulder that have the operation to support it.    Interscalene Nerve Block (ISNB) Discharge Instructions   1.  For your surgery you have received an Interscalene Nerve Block. 2. Nerve Blocks  affect many types of nerves, including nerves that control movement, pain and normal sensation.  You may experience feelings such as numbness, tingling, heaviness, weakness or the inability to move your arm or the feeling or sensation that your arm has "fallen asleep". 3. A nerve block can last for 2 - 36 hours or more depending on the medication used.  Usually the weakness wears off first.  The tingling and heaviness usually wear off next.  Finally you may start to notice pain.  Keep in mind that this may occur in any order.  once a nerve block starts to wear off it is usually completely gone within 60 minutes. 4. ISNB may cause mild shortness of breath, a hoarse voice, blurry vision, unequal pupils, or drooping of the face on the same side as the nerve block.  These symptoms will usually go away within 12 hours.  Very rarely the procedure itself can cause mild seizures. 5. If needed, your surgeon will give you a prescription for pain medication.  It will take about 60 minutes for the oral pain medication to become fully effective.  So, it is recommended that you start taking this medication before the nerve block first begins to wear off, or when you first begin to feel discomfort. 6. Keep in mind that nerve blocks often wear off in the middle of the night.   If you are  going to bed and the block has not started to wear off or you have not started to have any discomfort, consider setting an alarm for 2 to 3 hours, so you can assess your block.  If you notice the block is wearing off or you are starting to have discomfort, you can take your pain medication. 7. Take your pain medication only as prescribed.  Pain medication can cause sedation and decrease your breathing if you take more than you need for the level of pain that you have. 8. Nausea is a common side effect of many pain medications.  You may want to eat something before taking your pain medicine to prevent nausea. 9. After an Interscalene nerve  block, you cannot feel pain, pressure or extremes in temperature in the effected arm.  Because your arm is numb it is at an increased risk for injury.  To decrease the possibility of injury, please practice the following:  a. While you are awake change the position of your arm frequently to prevent too much pressure on any one area for prolonged periods of time. b.  If you have a cast or tight dressing, check the color or your fingers every couple of hours.  Call your surgeon with the appearance of any discoloration (white or blue). c. If you are given a sling to wear before you go home, please wear it  at all times until the block has completely worn off.  Do not get up at night without your sling. d. If you experience any problems or concerns, please contact your surgeon's office. e. If you experience severe or prolonged shortness of breath go to the nearest emergency department.

## 2018-02-21 NOTE — Anesthesia Post-op Follow-up Note (Signed)
Anesthesia QCDR form completed.        

## 2018-04-05 ENCOUNTER — Encounter: Payer: Self-pay | Admitting: Nurse Practitioner

## 2018-04-05 ENCOUNTER — Other Ambulatory Visit: Payer: Self-pay

## 2018-04-05 ENCOUNTER — Ambulatory Visit (INDEPENDENT_AMBULATORY_CARE_PROVIDER_SITE_OTHER): Payer: BLUE CROSS/BLUE SHIELD | Admitting: Nurse Practitioner

## 2018-04-05 ENCOUNTER — Other Ambulatory Visit: Payer: Self-pay | Admitting: Nurse Practitioner

## 2018-04-05 VITALS — BP 116/65 | HR 66 | Temp 97.9°F | Resp 16 | Ht 62.0 in | Wt 116.4 lb

## 2018-04-05 DIAGNOSIS — Z0001 Encounter for general adult medical examination with abnormal findings: Secondary | ICD-10-CM

## 2018-04-05 DIAGNOSIS — R233 Spontaneous ecchymoses: Secondary | ICD-10-CM

## 2018-04-05 DIAGNOSIS — F5101 Primary insomnia: Secondary | ICD-10-CM | POA: Diagnosis not present

## 2018-04-05 DIAGNOSIS — Z0000 Encounter for general adult medical examination without abnormal findings: Secondary | ICD-10-CM

## 2018-04-05 DIAGNOSIS — Z13820 Encounter for screening for osteoporosis: Secondary | ICD-10-CM

## 2018-04-05 MED ORDER — TRAZODONE HCL 50 MG PO TABS: 25.0000 mg | ORAL_TABLET | Freq: Every evening | ORAL | 3 refills | Status: DC | PRN

## 2018-04-05 NOTE — Progress Notes (Signed)
Subjective:    Patient ID: Molly Banks, female    DOB: May 28, 1961, 57 y.o.   MRN: [id number]  Molly Banks is a 57 y.o. female presenting on 04/05/2018 for Annual Exam (intermittent bilateral rash on the forearm x 1 yr)   HPI Petechial Rash She has acute concerns today about rash bilateral forearms.  Petechial rash without itchiness that lasts 3-4 days without trauma.  Spontaneous resolution. She admits she has had to go to cancer center for treatment of high hemoglobin in past.  Insomnia Sleeps 2-3 hours per night rarely interrupted. Has trouble falling asleep.  Good sleep by 3am, but very restless before that time.  Has tried melatonin in past without relief.She is not currently happy with level of sleep.  Has hesitation to try medications.  Annual Physical Exam Patient has been feeling well overall despite acute concerns above.  HEALTH MAINTENANCE: Sleep: 2-3 hours per night rarely interrupted. Has trouble falling asleep.   Weight/BMI: stable and healthy weight Physical activity: regular activity with moving at home. Limits lifting, walking regularly. Diet: regular diet.  Grills more in summer and is generally healthy overall. Seatbelt: always Sunscreen: rarely PAP: Hysterectomy many years ago Mammogram: 03/2017 - [facility] 1 DEXA: due  Colon Cancer Screen: Colonoscopy 2015 HIV/HEP C: collected today Optometry: regular Dentistry: none - false teeth  VACCINES: Tetanus: received about 3-4 years ago - cut hand, was seen for this at walk in clinic [facility]  (In front of [place]) Influenza: due - declines Shingles: recommended  Past Medical History:  Diagnosis Date  . Arthritis   . Breast lump in female 2005  . COPD (chronic obstructive pulmonary disease) ([place])   . Emphysema lung ([place])   . Erythrocytosis   . Frequent headaches    tension related  . GERD (gastroesophageal reflux disease)    h/o  . Hypoglycemia   . Hypoglycemia   . Leukocytosis   . Thoracic scoliosis    Left concavity  . Thyroid nodule    right side   Past Surgical History:  Procedure Laterality Date  . ABDOMINAL HYSTERECTOMY     oophrectomy NOT perfomed  . BREAST BIOPSY Left    [place]  . CHOLECYSTECTOMY    . SHOULDER ARTHROSCOPY WITH BICEPSTENOTOMY Right 07/31/2017   Procedure: SHOULDER ARTHROSCOPY WITH BICEPSTENOTOMY;  Surgeon: Lovell Sheehan, MD;  Location: [facility] ORS;  Service: Orthopedics;  Laterality: Right;  . SHOULDER ARTHROSCOPY WITH SUBACROMIAL DECOMPRESSION Right 07/31/2017   Procedure: SHOULDER ARTHROSCOPY WITH SUBACROMIAL DECOMPRESSION;  Surgeon: Lovell Sheehan, MD;  Location: [facility] ORS;  Service: Orthopedics;  Laterality: Right;  Limited debridement  . TOE SURGERY Left    4th toe  . TONSILLECTOMY  1966   Social History   Socioeconomic History  . Marital status: Married    Spouse name: Not on file  . Number of children: Not on file  . Years of education: Not on file  . Highest education level: Not on file  Occupational History  . Not on file  Social Needs  . Financial resource strain: Not on file  . Food insecurity:    Worry: Not on file    Inability: Not on file  . Transportation needs:    Medical: Not on file    Non-medical: Not on file  Tobacco Use  . Smoking status: Current Every Day Smoker    Packs/day: 0.50    Years: 30.00    Pack years: 15.00    Types: Cigarettes  . Smokeless tobacco:  Former Systems developer  . Tobacco comment: 14, 20s  Substance and Sexual Activity  . Alcohol use: No  . Drug use: No  . Sexual activity: Not on file  Lifestyle  . Physical activity:    Days per week: Not on file    Minutes per session: Not on file  . Stress: Not on file  Relationships  . Social connections:    Talks on phone: Not on file    Gets together: Not on file    Attends religious service: Not on file    Active member of club or organization: Not on file    Attends meetings of clubs or organizations: Not on file    Relationship status: Not on file  .  Intimate partner violence:    Fear of current or ex partner: Not on file    Emotionally abused: Not on file    Physically abused: Not on file    Forced sexual activity: Not on file  Other Topics Concern  . Not on file  Social History Narrative  . Not on file   Family History  Problem Relation Age of Onset  . Brain cancer Mother   . Cancer Mother        brain  . Hiatal hernia Mother   . Lung cancer Father   . COPD Father   . Cancer - Lung Father   . Lymphoma Sister   . Breast cancer Sister 37       BRCA positive test  . Breast cancer Daughter 24  . Breast cancer Maternal Grandmother   . Lung cancer Maternal Grandfather   . Ovarian cancer Neg Hx   . Colon cancer Neg Hx   . Stroke Neg Hx   . Heart disease Neg Hx    No current outpatient medications on file prior to visit.   No current facility-administered medications on file prior to visit.     Review of Systems  Constitutional: Positive for fatigue. Negative for chills and fever.  HENT: Negative for congestion and sore throat.   Eyes: Negative for pain.  Respiratory: Negative for cough, shortness of breath and wheezing.   Cardiovascular: Negative for chest pain, palpitations and leg swelling.  Gastrointestinal: Negative for abdominal pain, blood in stool, constipation, diarrhea, nausea and vomiting.  Endocrine: Negative for polydipsia.  Genitourinary: Positive for difficulty urinating (urge incontinence). Negative for dysuria, frequency, hematuria and urgency.  Musculoskeletal: Positive for arthralgias. Negative for back pain, myalgias and neck pain.  Skin: Positive for rash.  Allergic/Immunologic: Negative for environmental allergies.  Neurological: Negative for dizziness, weakness and headaches.  Hematological: Does not bruise/bleed easily.  Psychiatric/Behavioral: Positive for sleep disturbance. Negative for dysphoric mood and suicidal ideas. The patient is not nervous/anxious.    Per HPI unless specifically  indicated above    Objective:    BP 116/65 (BP Location: Left Arm, Patient Position: Sitting, Cuff Size: Small)   Pulse 66   Temp 97.9 F (36.6 C) (Oral)   Resp 16   Ht 5' 2"  (1.575 m)   Wt 116 lb 6.4 oz (52.8 kg)   BMI 21.29 kg/m   Wt Readings from Last 3 Encounters:  04/05/18 116 lb 6.4 oz (52.8 kg)  02/21/18 112 lb 9 oz (51.1 kg)  02/16/18 118 lb (53.5 kg)    Physical Exam  Constitutional: She is oriented to person, place, and time. She appears well-developed and well-nourished. No distress.  HENT:  Head: Normocephalic and atraumatic.  Right Ear: External ear normal.  Left Ear: External ear normal.  Nose: Nose normal.  Mouth/Throat: Oropharynx is clear and moist.  Eyes: Pupils are equal, round, and reactive to light. Conjunctivae are normal.  Neck: Normal range of motion. Neck supple. No JVD present. No tracheal deviation present. No thyromegaly present.  Cardiovascular: Normal rate, regular rhythm, normal heart sounds and intact distal pulses. Exam reveals no gallop and no friction rub.  No murmur heard. Pulmonary/Chest: Effort normal. No respiratory distress. She has wheezes (end inspiration). She has rhonchi (clear with coughing). She has rales (end inspiration).  Breast - Normal exam w/ symmetric breasts, no mass, no nipple discharge, no skin changes or tenderness.  Abdominal: Soft. Bowel sounds are normal. She exhibits no distension. There is no hepatosplenomegaly. There is no tenderness.  Musculoskeletal: Normal range of motion.  Lymphadenopathy:    She has no cervical adenopathy.  Neurological: She is alert and oriented to person, place, and time. No cranial nerve deficit.  Skin: Skin is warm and dry. Capillary refill takes less than 2 seconds. Rash (petechial rash noted < 1 mm round papules, diffusely scattered throughout area of 2 cm x 7 cm along left forearm.  R forearm has fewer and resolving petechiae over 2 cm x 2 cm area) noted.  Psychiatric: She has a normal  mood and affect. Her behavior is normal. Judgment and thought content normal.  Nursing note and vitals reviewed.    Results for orders placed or performed during the hospital encounter of 27/78/24  Basic metabolic panel  Result Value Ref Range   Sodium 140 135 - 145 mmol/L   Potassium 3.7 3.5 - 5.1 mmol/L   Chloride 105 98 - 111 mmol/L   CO2 27 22 - 32 mmol/L   Glucose, Bld 77 70 - 99 mg/dL   BUN 7 6 - 20 mg/dL   Creatinine, Ser 0.45 0.44 - 1.00 mg/dL   Calcium 9.4 8.9 - 10.3 mg/dL   GFR calc non Af Amer >60 >60 mL/min   GFR calc Af Amer >60 >60 mL/min   Anion gap 8 5 - 15  CBC  Result Value Ref Range   WBC 10.3 3.6 - 11.0 K/uL   RBC 5.18 3.80 - 5.20 MIL/uL   Hemoglobin 15.4 12.0 - 16.0 g/dL   HCT 45.3 35.0 - 47.0 %   MCV 87.3 80.0 - 100.0 fL   MCH 29.7 26.0 - 34.0 pg   MCHC 34.0 32.0 - 36.0 g/dL   RDW 13.8 11.5 - 14.5 %   Platelets 266 150 - 440 K/uL      Assessment & Plan:   Problem List Items Addressed This Visit      Other   History of polycythemia   Primary insomnia   Relevant Medications   traZODone (DESYREL) 50 MG tablet    Other Visit Diagnoses    Encounter for general adult medical examination with abnormal findings    -  Primary   Relevant Orders   DG Bone Density   Petechial rash       Relevant Orders   CBC with Differential/Platelet   Osteoporosis screening       Relevant Orders   DG Bone Density     # Well adult annual physical exam with new findings of petechial rash and insomnia.  Well adult with no other acute concerns.  Lives healthy, active lifestyle.  Continues care for R shoulder workplace injury at [facility] (Dr. Harlow Mares and Physical Therapy). Physical exam suggestive of uncontrolled COPD.  Patient  currently asymptomatic and does not desire medications.  Plan: 1. Obtain health maintenance screenings according to age:  - Labs (CBC, CMP, Lipid, TSH, HIV, Hep C)  Orders placed prior to visit and during visit. - DEXA scan osteoporosis  screening.  Scheduling number provided to patient. - Continue physical activity of 30 minutes most days of the week.  - Eat healthy diet high in vegetables and fruits; low in refined carbohydrates. 2. Return 1 year for annual physical  # Insomnia is likely primary insomnia related to poor sleep hygiene.  Is only affecting onset of sleep at this time. - START sleep hygiene routine.  Stop using phone 30-60 minutes prior to bed and in bed. - START trazodone 25-50 mg once daily prn insomnia at onset of sleep. - Followup 3 months.  # Petechial rash Onset of transient petechial rash over bilateral forearms that is spontaneously resolving.  Patient has history of erythrocytosis followed by Dr. Rogue Bussing.  Hemoglobin in July 2019 was high normal.  Recheck CBC today.  Consider repeat referral to hematology vs dermatology for evaluation depending on results.  Followup prn.    Meds ordered this encounter  Medications  . traZODone (DESYREL) 50 MG tablet    Sig: Take 0.5-1 tablets (25-50 mg total) by mouth at bedtime as needed for sleep.    Dispense:  30 tablet    Refill:  3    Order Specific Question:   Supervising Provider    Answer:   Olin Hauser [[id number]]     Follow up plan: Return in about 1 year (around 04/06/2019) for annual physical AND 3 months for insomnia.  Cassell Smiles, DNP, AGPCNP-BC Adult Gerontology Primary Care Nurse Practitioner [facility] 04/05/2018, 8:36 AM

## 2018-04-05 NOTE — Patient Instructions (Addendum)
Molly Banks A Delauder,   Thank you for coming in to clinic today.  1. Shingrix - Shingles vaccine: 2 doses 2-6 months apart.  FootballEx[employer].[email or web address].brwww.cdc.gov for more information if you have questions.   2.  Start trazodone 50 mg tablet.  Take one half tablet  (25 mg) about 20 minutes before sleep onset.    3. Your Bone density scan order has been placed.  Call the Scheduling phone number at [phone] to schedule your bone density test at your convenience.  You can choose to go to either location listed below.  Let the scheduler know which location you prefer.  [facility]  [facility]  [address]  [place], [place] [address]   [facility] [place] [address] [place], [place] [address]  4. For your incontinence, start doing kegel muscles to strengthen your muscles.  5. Sleep Hygiene Tips  Take medicines only as directed by your health care provider.  Keep regular sleeping and waking hours. Avoid naps.  Keep a sleep diary to help you and your health care provider figure out what could be causing your insomnia. Include:  When you sleep.  When you wake up during the night.  How well you sleep.  How rested you feel the next day.  Any side effects of medicines you are taking.  What you eat and drink.  Make your bedroom a comfortable place where it is easy to fall asleep:  Put up shades or special blackout curtains to block light from outside.  Use a white noise machine to block noise.  Keep the temperature cool.  Exercise regularly as directed by your health care provider. Avoid exercising right before bedtime.  Use relaxation techniques to manage stress. Ask your health care provider to suggest some techniques that may work well for you. These may include:  Breathing exercises.  Routines to release muscle tension.  Visualizing peaceful scenes.  Cut back on alcohol, caffeinated beverages, and cigarettes,  especially close to bedtime. These can disrupt your sleep.  Do not overeat or eat spicy foods right before bedtime. This can lead to digestive discomfort that can make it hard for you to sleep.  Limit screen use before bedtime. This includes:  Watching TV.  Using your smartphone, tablet, and computer.  Stick to a routine. This can help you fall asleep faster. Try to do a quiet activity, brush your teeth, and go to bed at the same time each night.  Get out of bed if you are still awake after 15 minutes of trying to sleep. Keep the lights down, but try reading or doing a quiet activity. When you feel sleepy, go back to bed.  Make sure that you drive carefully. Avoid driving if you feel very sleepy.  Keep all follow-up appointments as directed by your health care provider. This is important.   Please schedule a follow-up appointment with Molly McardleLauren Maya Banks, AGNP. Return in about 1 year (around 04/06/2019) for annual physical AND 3 months for insomnia.  If you have any other questions or concerns, please feel free to call the clinic or send a message through MyChart. You may also schedule an earlier appointment if necessary.  You will receive a survey after today's visit either digitally by e-mail or paper by [employer] mail. Your experiences and feedback matter to us.  Please respond so we know how we are doing as we provide care for you.   Molly McardleLauren Molly Royce, DNP, AGNP-BC Adult Gerontology Nurse  Practitioner [facility]   Kegel Exercises Kegel exercises help strengthen the muscles that support the rectum, vagina, small intestine, bladder, and uterus. Doing Kegel exercises can help:  Improve bladder and bowel control.  Improve sexual response.  Reduce problems and discomfort during pregnancy.  Kegel exercises involve squeezing your pelvic floor muscles, which are the same muscles you squeeze when you try to stop the flow of urine. The exercises can be done while sitting,  standing, or lying down, but it is best to vary your position. Phase 1 exercises 1. Squeeze your pelvic floor muscles tight. You should feel a tight lift in your rectal area. If you are a female, you should also feel a tightness in your vaginal area. Keep your stomach, buttocks, and legs relaxed. 2. Hold the muscles tight for up to 10 seconds. 3. Relax your muscles. Repeat this exercise 50 times a day or as many times as told by your health care provider. Continue to do this exercise for at least 4-6 weeks or for as long as told by your health care provider. This information is not intended to replace advice given to you by your health care provider. Make sure you discuss any questions you have with your health care provider. Document Released: 07/25/2012 Document Revised: 04/02/2016 Document Reviewed: 06/28/2015 Elsevier Interactive Patient Education  [employer]2018 Elsevier Inc.

## 2018-04-06 LAB — HEPATITIS C ANTIBODY
Hepatitis C Ab: NONREACTIVE
SIGNAL TO CUT-OFF: 0.02 (ref <1.00)

## 2018-04-06 LAB — COMPLETE METABOLIC PANEL WITH GFR
AG Ratio: 1.8 (calc) (ref 1.0–2.5)
ALT: 3 U/L — ABNORMAL LOW (ref 6–29)
AST: 12 U/L (ref 10–35)
Albumin: 3.9 g/dL (ref 3.6–5.1)
Alkaline phosphatase (APISO): 74 U/L (ref 33–130)
BUN: 7 mg/dL (ref 7–25)
CO2: 30 mmol/L (ref 20–32)
Calcium: 9.3 mg/dL (ref 8.6–10.4)
Chloride: 107 mmol/L (ref 98–110)
Creat: 0.63 mg/dL (ref 0.50–1.05)
GFR, Est African American: 116 mL/min/{1.73_m2} (ref >=60)
GFR, Est Non African American: 100 mL/min/{1.73_m2} (ref >=60)
Globulin: 2.2 g/dL (calc) (ref 1.9–3.7)
Glucose, Bld: 89 mg/dL (ref 65–99)
Potassium: 4.4 mmol/L (ref 3.5–5.3)
Sodium: 143 mmol/L (ref 135–146)
Total Bilirubin: 0.6 mg/dL (ref 0.2–1.2)
Total Protein: 6.1 g/dL (ref 6.1–8.1)

## 2018-04-06 LAB — TSH: TSH: 2.99 mIU/L (ref 0.40–4.50)

## 2018-04-06 LAB — LIPID PANEL
Cholesterol: 231 mg/dL — ABNORMAL HIGH (ref <200)
HDL: 75 mg/dL (ref >50)
LDL Cholesterol (Calc): 140 mg/dL (calc) — ABNORMAL HIGH
Non-HDL Cholesterol (Calc): 156 mg/dL (calc) — ABNORMAL HIGH (ref <130)
Total CHOL/HDL Ratio: 3.1 (calc) (ref <5.0)
Triglycerides: 69 mg/dL (ref <150)

## 2018-04-06 LAB — HIV ANTIBODY (ROUTINE TESTING W REFLEX): HIV 1&2 Ab, 4th Generation: NONREACTIVE

## 2018-04-07 ENCOUNTER — Telehealth: Payer: Self-pay

## 2018-04-07 NOTE — Telephone Encounter (Signed)
Call pt regarding lung screening. Left message on 04-07-18 at 10:23.

## 2018-04-09 ENCOUNTER — Telehealth: Payer: Self-pay | Admitting: Nurse Practitioner

## 2018-04-10 ENCOUNTER — Telehealth: Payer: Self-pay | Admitting: Nurse Practitioner

## 2018-04-18 ENCOUNTER — Ambulatory Visit
Admission: RE | Admit: 2018-04-18 | Discharge: 2018-04-18 | Disposition: A | Discharge status: Home / Self Care | Payer: BLUE CROSS/BLUE SHIELD | Source: Ambulatory Visit | Attending: Nurse Practitioner | Admitting: Nurse Practitioner

## 2018-04-18 DIAGNOSIS — Z13820 Encounter for screening for osteoporosis: Secondary | ICD-10-CM | POA: Diagnosis present

## 2018-04-18 DIAGNOSIS — Z0001 Encounter for general adult medical examination with abnormal findings: Secondary | ICD-10-CM | POA: Insufficient documentation

## 2018-05-08 ENCOUNTER — Other Ambulatory Visit: Payer: Self-pay

## 2018-05-08 ENCOUNTER — Ambulatory Visit (INDEPENDENT_AMBULATORY_CARE_PROVIDER_SITE_OTHER): Payer: BLUE CROSS/BLUE SHIELD | Admitting: Nurse Practitioner

## 2018-05-08 ENCOUNTER — Encounter: Payer: Self-pay | Admitting: Nurse Practitioner

## 2018-05-08 VITALS — BP 111/63 | HR 82 | Temp 98.2°F | Resp 18 | Ht 62.0 in | Wt 114.4 lb

## 2018-05-08 DIAGNOSIS — H65191 Other acute nonsuppurative otitis media, right ear: Secondary | ICD-10-CM | POA: Diagnosis not present

## 2018-05-08 DIAGNOSIS — B9789 Other viral agents as the cause of diseases classified elsewhere: Secondary | ICD-10-CM

## 2018-05-08 DIAGNOSIS — J029 Acute pharyngitis, unspecified: Secondary | ICD-10-CM | POA: Diagnosis not present

## 2018-05-08 DIAGNOSIS — J028 Acute pharyngitis due to other specified organisms: Principal | ICD-10-CM

## 2018-05-08 LAB — POCT RAPID STREP A (OFFICE): Rapid Strep A Screen: NEGATIVE

## 2018-05-08 MED ORDER — FLUTICASONE PROPIONATE 50 MCG/ACT NA SUSP: 2.0000 | Freq: Every day | NASAL | 6 refills | Status: DC

## 2018-05-08 NOTE — Progress Notes (Signed)
Subjective:    Patient ID: Molly Banks, female    DOB: 1960/11/19, 57 y.o.   MRN: [id number]  Molly Banks is a 57 y.o. female presenting on 05/08/2018 for Sore Throat (fatigue, throat irritated w/ couging x 2 days )   HPI Sore Throat Has had cough over last 2-3 days and has burning, on fire feeling in back of throat.  Also has burning with some food/drink.  Sometimes has lightheadedness and weakness with coughing, position changes.   Has had sensation of fluid in either ear when lying on her side.  Denies ear pain. - Is drinking plenty of fluids and is keeping down food with eating.   - Denies fever, chills, or sweats, nausea, vomiting, or diarrhea.  - No significant nasal drainage/congestion. - Cough is occasionally productive.  Insomnia - Trazodone is very helpful with 50 mg once daily at bedtime.  Is not staying asleep all night, but is able to return to sleep easily. Takes a break on weekends occasionally. - Calcium and vit D were constipating. Is drinking OJ with calcium.  Social History   Tobacco Use  . Smoking status: Current Every Day Smoker    Packs/day: 1.00    Years: 30.00    Pack years: 30.00    Types: Cigarettes  . Smokeless tobacco: Former Neurosurgeon  . Tobacco comment: 14, 20s  Substance Use Topics  . Alcohol use: No  . Drug use: No    Review of Systems Per HPI unless specifically indicated above     Objective:    BP 111/63 (BP Location: Right Arm, Patient Position: Sitting, Cuff Size: Small)   Pulse 82   Temp 98.2 F (36.8 C) (Oral)   Resp 18   Ht 5\' 2"  (1.575 m)   Wt 114 lb 6.4 oz (51.9 kg)   SpO2 98%   BMI 20.92 kg/m   Wt Readings from Last 3 Encounters:  05/08/18 114 lb 6.4 oz (51.9 kg)  04/05/18 116 lb 6.4 oz (52.8 kg)  02/21/18 112 lb 9 oz (51.1 kg)    Physical Exam  Constitutional: She is oriented to person, place, and time. She appears well-developed and well-nourished. No distress.  HENT:  Head: Normocephalic and atraumatic.  Right Ear:  Hearing, external ear and ear canal normal. No lacerations. No foreign bodies. No mastoid tenderness. Tympanic membrane is not injected, not scarred, not perforated, not erythematous, not retracted and not bulging. A middle ear effusion is present.  Left Ear: Hearing, external ear and ear canal normal. No lacerations. No foreign bodies. No mastoid tenderness. Tympanic membrane is not injected, not scarred, not perforated, not erythematous, not retracted and not bulging.  No middle ear effusion.  Nose: Nose normal. Right sinus exhibits no maxillary sinus tenderness and no frontal sinus tenderness. Left sinus exhibits no maxillary sinus tenderness and no frontal sinus tenderness.  Mouth/Throat: Uvula is midline and mucous membranes are normal. Posterior oropharyngeal erythema present. No oropharyngeal exudate, posterior oropharyngeal edema or tonsillar abscesses (surgically absent tonsils).  Cardiovascular: Normal rate, regular rhythm, S1 normal, S2 normal, normal heart sounds and intact distal pulses.  Pulmonary/Chest: Effort normal and breath sounds normal. No respiratory distress.  Neurological: She is alert and oriented to person, place, and time.  Skin: Skin is warm and dry.  Psychiatric: She has a normal mood and affect. Her behavior is normal.  Vitals reviewed.   Results for orders placed or performed in visit on 04/05/18  COMPLETE METABOLIC PANEL WITH GFR  Result  Value Ref Range   Glucose, Bld 89 65 - 99 mg/dL   BUN 7 7 - 25 mg/dL   Creat 2.950.63 6.210.50 - 3.081.05 mg/dL   GFR, Est Non African American 100 > OR = 60 mL/min/1.1973m2   GFR, Est African American 116 > OR = 60 mL/min/1.7373m2   BUN/Creatinine Ratio NOT APPLICABLE 6 - 22 (calc)   Sodium 143 135 - 146 mmol/L   Potassium 4.4 3.5 - 5.3 mmol/L   Chloride 107 98 - 110 mmol/L   CO2 30 20 - 32 mmol/L   Calcium 9.3 8.6 - 10.4 mg/dL   Total Protein 6.1 6.1 - 8.1 g/dL   Albumin 3.9 3.6 - 5.1 g/dL   Globulin 2.2 1.9 - 3.7 g/dL (calc)   AG Ratio  1.8 1.0 - 2.5 (calc)   Total Bilirubin 0.6 0.2 - 1.2 mg/dL   Alkaline phosphatase (APISO) 74 33 - 130 U/L   AST 12 10 - 35 U/L   ALT 3 (L) 6 - 29 U/L  TSH  Result Value Ref Range   TSH 2.99 0.40 - 4.50 mIU/L  Lipid panel  Result Value Ref Range   Cholesterol 231 (H) <200 mg/dL   HDL 75 >65>50 mg/dL   Triglycerides 69 <784<150 mg/dL   LDL Cholesterol (Calc) 140 (H) mg/dL (calc)   Total CHOL/HDL Ratio 3.1 <5.0 (calc)   Non-HDL Cholesterol (Calc) 156 (H) <130 mg/dL (calc)  HIV antibody  Result Value Ref Range   HIV 1&2 Ab, 4th Generation NON-REACTIVE NON-REACTI  Hepatitis C antibody  Result Value Ref Range   Hepatitis C Ab NON-REACTIVE NON-REACTI   SIGNAL TO CUT-OFF 0.02 <1.00      Assessment & Plan:   Problem List Items Addressed This Visit    None    Visit Diagnoses    Sore throat (viral)    -  Primary   Relevant Orders   POCT rapid strep A (Completed)   Acute effusion of right ear       Relevant Medications   fluticasone (FLONASE) 50 MCG/ACT nasal spray    Connsistent with acute pharyngitis, suspected viral etiology with constellation of viral symptoms and -sick contacts, No significant history of strep throat, no recent antibiotics. Afebrile and no focal signs of infection (TM's clear, lungs clear). Considered negative strep test, tolerating PO well.  Plan: 1. Rapid strep negative today. Reassurance, likely viral etiology of pharyngitis, should be self limited 2. Symptomatic control with NSAID / Tylenol regularly then PRN 3. Improve hydration, advance diet, warm herbal tea with honey 4. Consider trial Flonase for mid ear effusion, anti-histamines to resolve post-nasal drip 5. Follow-up if worsening within 1 week   Meds ordered this encounter  Medications  . fluticasone (FLONASE) 50 MCG/ACT nasal spray    Sig: Place 2 sprays into both nostrils daily.    Dispense:  16 g    Refill:  6    Order Specific Question:   Supervising Provider    Answer:   Smitty CordsKARAMALEGOS, ALEXANDER J  [[id number]]    Follow up plan: Return 5-7 if symptoms worsen or fail to improve.  Wilhelmina McardleLauren Caydon Feasel, DNP, AGPCNP-BC Adult Gerontology Primary Care Nurse Practitioner [facility] 05/08/2018, 1:32 PM

## 2018-05-08 NOTE — Patient Instructions (Addendum)
Cloyde Reamsracy A Behunin,   Thank you for coming in to clinic today.  1.  It sounds like you have a Upper Respiratory Virus causing pharyngitis. This will most likely run it's course in 7 to 10 days. Recommend good hand washing. - START Flonase 2 sprays each nostril daily for up to 4-6 weeks - If congestion is worse, start OTC Mucinex (or may try Mucinex-DM for cough) up to 7-10 days then stop - Drink plenty of fluids to improve congestion - You may try over the counter Nasal Saline spray (Simply Saline, Ocean Spray) as needed to reduce congestion. - Drink warm herbal tea with honey for sore throat. - Start taking Tylenol extra strength 1 to 2 tablets every 6-8 hours for aches or fever/chills for next few days as needed.  Do not take more than 3,000 mg in 24 hours from all medicines.  May take Ibuprofen as well if tolerated 200-400mg  every 8 hours as needed.  If symptoms significantly worsening with persistent fevers/chills despite tylenol/ibpurofen, nausea, vomiting unable to tolerate food/fluids or medicine, body aches, or shortness of breath, sinus pain pressure or worsening productive cough, then follow-up for re-evaluation, may seek more immediate care at Urgent Care or ED if more concerned for emergency.  Please schedule a follow-up appointment with Wilhelmina McardleLauren Bethanny Toelle, AGNP. Return 5-7 if symptoms worsen or fail to improve.  If you have any other questions or concerns, please feel free to call the clinic or send a message through MyChart. You may also schedule an earlier appointment if necessary.  You will receive a survey after today's visit either digitally by e-mail or paper by [employer] mail. Your experiences and feedback matter to us.  Please respond so we know how we are doing as we provide care for you.   Wilhelmina McardleLauren Christina Gintz, DNP, AGNP-BC Adult Gerontology Nurse Practitioner [facility], [facility]

## 2018-06-09 IMAGING — CR DG SHOULDER 2+V*R*
1 series · 4 of 4 positions shown · non-contrast
Comparison: None.

CLINICAL DATA: Right shoulder injury wall lifting object 3 months
ago. Persistent right shoulder pain and limited range of motion.
Initial encounter.

EXAM:
RIGHT SHOULDER - 2+ VIEW

[Series 1: dg shoulder right · 0.14mm/px · 4 of 4 slices shown]
[im 1/4]
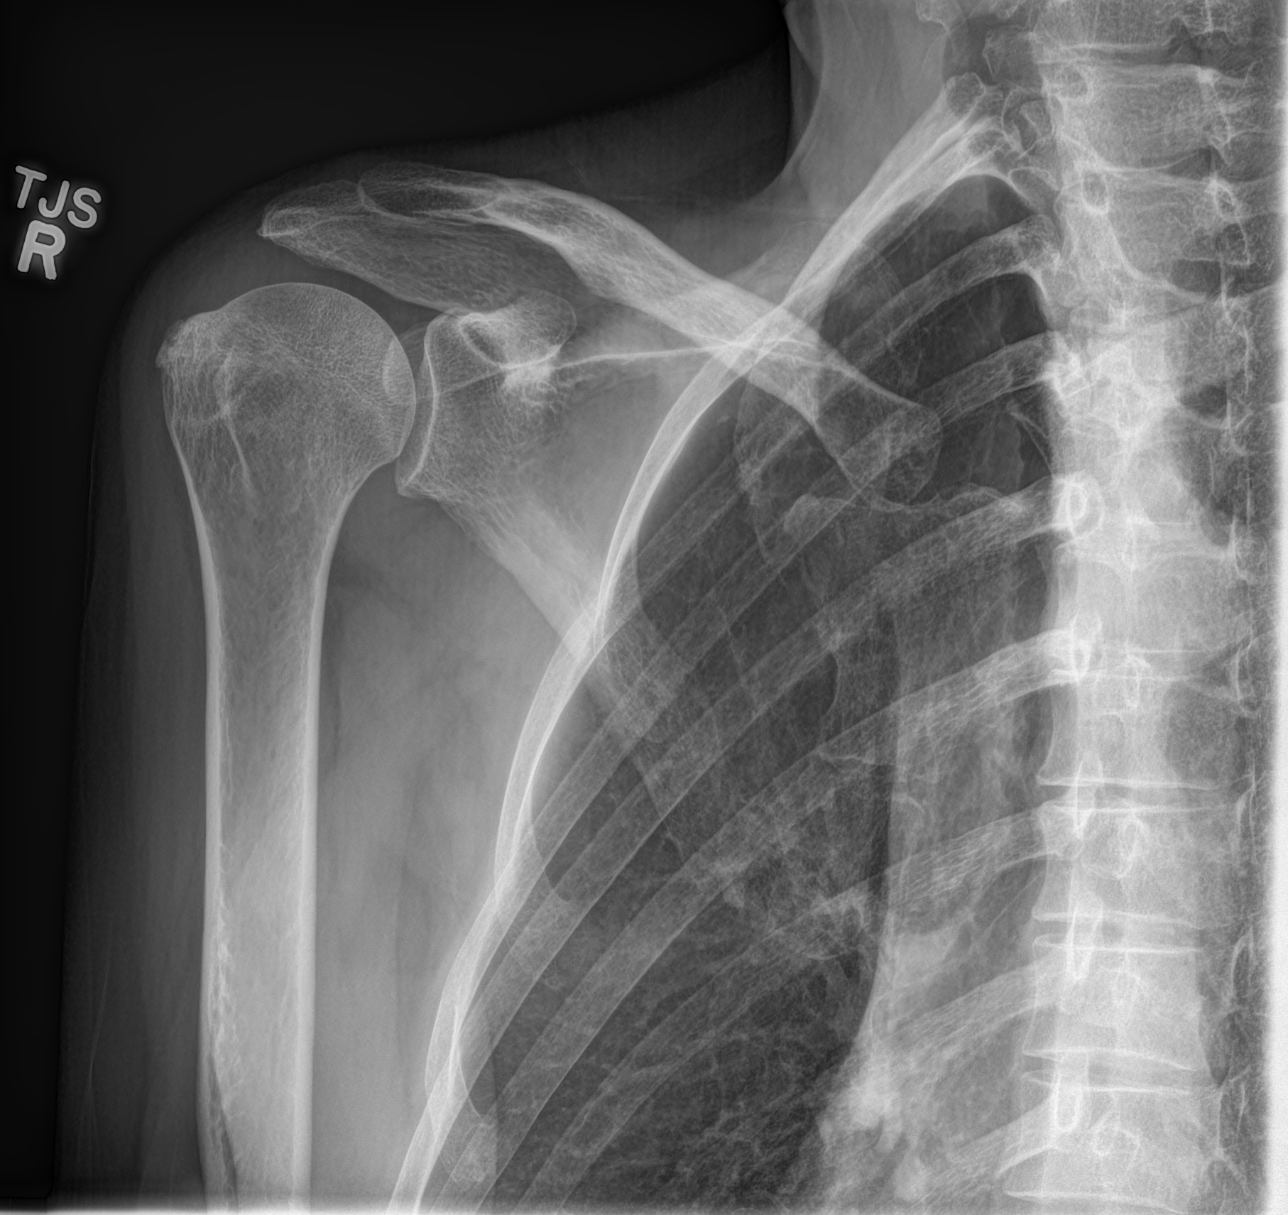
[im 2/4]
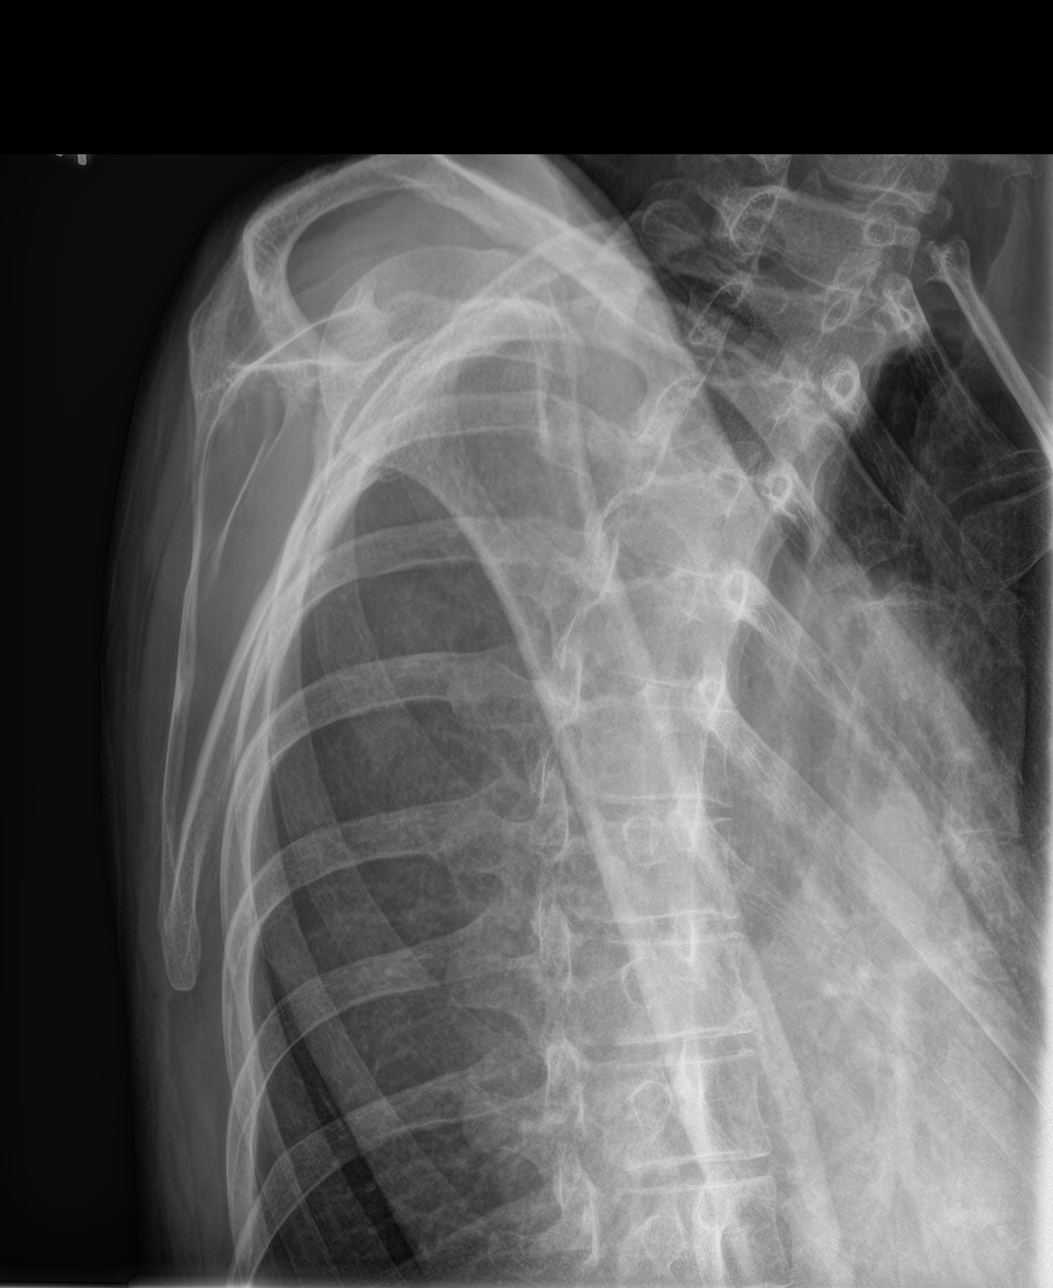
[im 3/4]
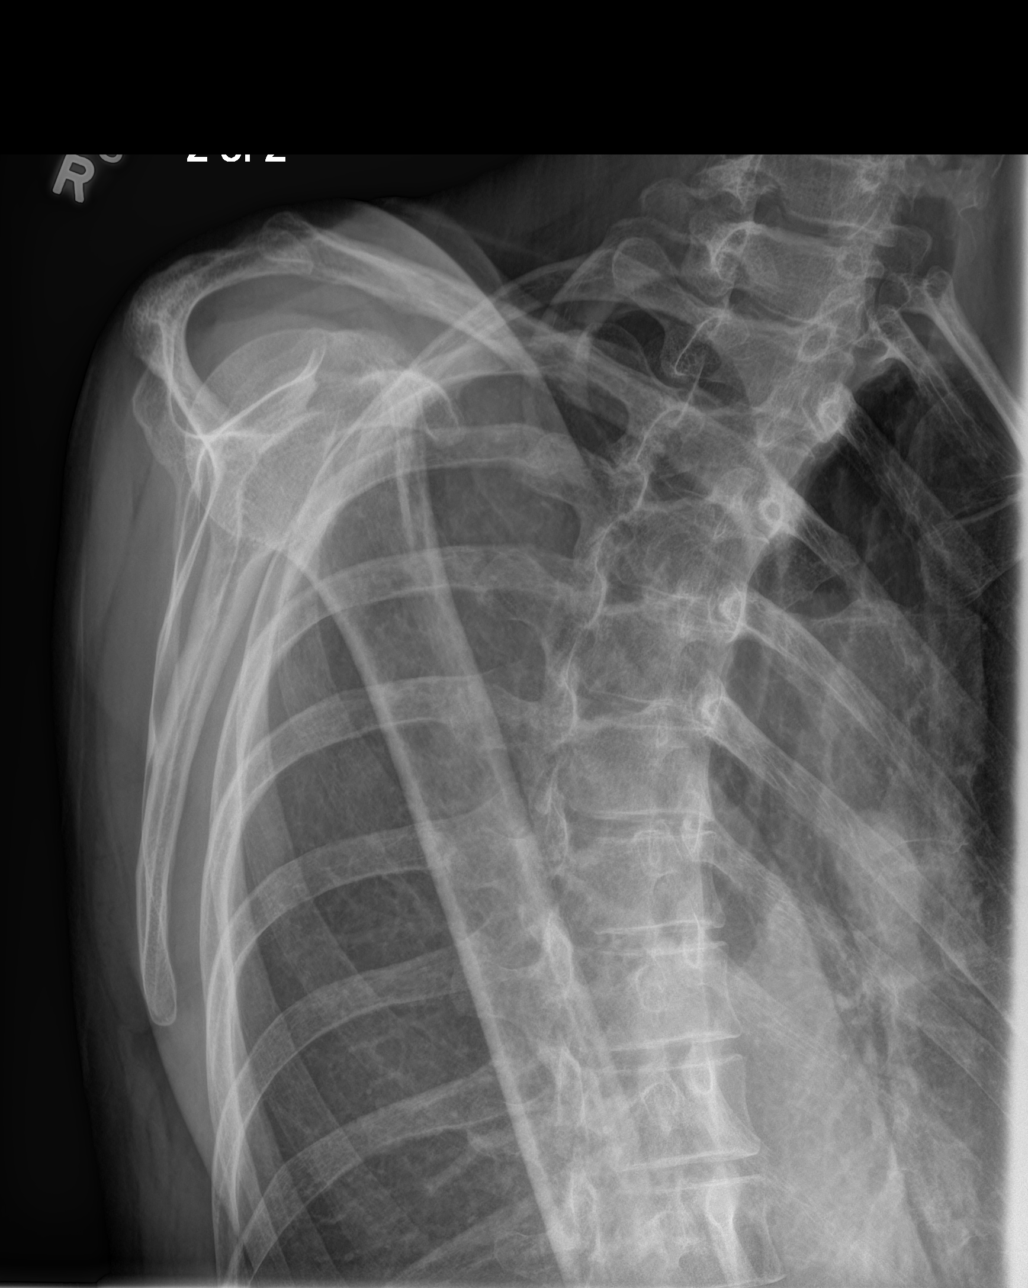
[im 4/4]
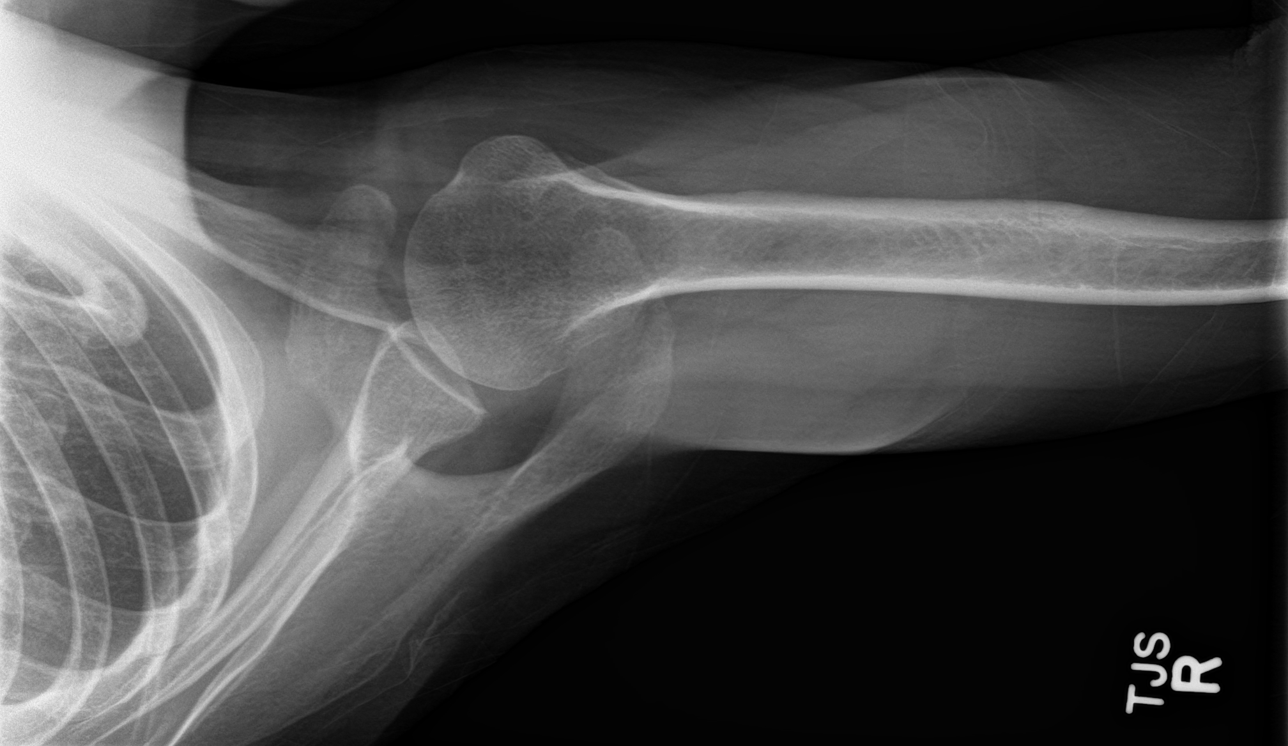

[4 of 4 positions shown; findings below may reference images not displayed]

FINDINGS: There is no evidence of fracture or dislocation. Mild spurring seen
along the greater tuberosity of the humeral head at the rotator cuff
insertion site. No other osseous abnormality identified.
IMPRESSION: No acute findings.

## 2018-07-04 ENCOUNTER — Encounter: Payer: Self-pay | Admitting: Nurse Practitioner

## 2018-07-04 ENCOUNTER — Other Ambulatory Visit: Payer: Self-pay

## 2018-07-04 ENCOUNTER — Ambulatory Visit (INDEPENDENT_AMBULATORY_CARE_PROVIDER_SITE_OTHER): Payer: BLUE CROSS/BLUE SHIELD | Admitting: Nurse Practitioner

## 2018-07-04 DIAGNOSIS — F5101 Primary insomnia: Secondary | ICD-10-CM | POA: Diagnosis not present

## 2018-07-04 MED ORDER — TRAZODONE HCL 50 MG PO TABS: 50.0000 mg | ORAL_TABLET | Freq: Every evening | ORAL | 3 refills | Status: DC | PRN

## 2018-07-04 NOTE — Progress Notes (Signed)
Subjective:    Patient ID: Molly Banks, female    DOB: 06/25/1961, 57 y.o.   MRN: [id number]  Molly Banks is a 57 y.o. female presenting on 07/04/2018 for Insomnia (pt notice improvement since starting medication)   HPI Insomnia Is taking new trazodone 50 mg 3-4 nights per week.  Sleeping 5-6 hours when taking regularly.  Sometimes gets more sleep, which is a bonus for the patient.  She is very happy with current state compared to past sleep.  - No daytime drowsiness.  - Isn't taking on nights before having to work.   Social History   Tobacco Use  . Smoking status: Current Every Day Smoker    Packs/day: 0.50    Years: 30.00    Pack years: 15.00    Types: Cigarettes  . Smokeless tobacco: Former NeurosurgeonUser  . Tobacco comment: 14, 20s  Substance Use Topics  . Alcohol use: No  . Drug use: No    Review of Systems Per HPI unless specifically indicated above     Objective:    BP 116/61 (BP Location: Left Arm, Patient Position: Sitting, Cuff Size: Normal)   Pulse 69   Temp 97.6 F (36.4 C) (Oral)   Resp 18   Ht 5\' 2"  (1.575 m)   Wt 116 lb 12.8 oz (53 kg)   SpO2 100%   BMI 21.36 kg/m   Wt Readings from Last 3 Encounters:  07/04/18 116 lb 12.8 oz (53 kg)  05/08/18 114 lb 6.4 oz (51.9 kg)  04/05/18 116 lb 6.4 oz (52.8 kg)    Physical Exam  Constitutional: She is oriented to person, place, and time. She appears well-developed and well-nourished. No distress.  HENT:  Head: Normocephalic and atraumatic.  Cardiovascular: Normal rate, regular rhythm, S1 normal, S2 normal, normal heart sounds and intact distal pulses.  Pulmonary/Chest: Effort normal and breath sounds normal. No respiratory distress.  Neurological: She is alert and oriented to person, place, and time.  Skin: Skin is warm and dry. Capillary refill takes less than 2 seconds.  Psychiatric: She has a normal mood and affect. Her behavior is normal. Judgment and thought content normal.  Vitals reviewed.    Results  for orders placed or performed in visit on 05/08/18  POCT rapid strep A  Result Value Ref Range   Rapid Strep A Screen Negative Negative      Assessment & Plan:   Problem List Items Addressed This Visit      Other   Primary insomnia   Relevant Medications   traZODone (DESYREL) 50 MG tablet    Improved insomnia on trazodone.  Continues to have early wakefulness, but is significantly improved over prior state.    Plan: 1. Encouraged patient is ok to take daily with occasional drug holidays.   2. Encouraged ongoing sleep hygiene work. 3. Follow-up 1 year and prn.    Meds ordered this encounter  Medications  . traZODone (DESYREL) 50 MG tablet    Sig: Take 1 tablet (50 mg total) by mouth at bedtime as needed for sleep.    Dispense:  90 tablet    Refill:  3    Order Specific Question:   Supervising Provider    Answer:   Smitty CordsKARAMALEGOS, ALEXANDER J [[id number]]    A total of 18 minutes was spent face-to-face with this patient. Greater than 50% of this time was spent in counseling and coordination of care with the patient (15/18 mins).   Follow up plan: Return in  about 1 year (around 07/05/2019) for insomnia.  [name], DNP, AGPCNP-BC Adult Gerontology Primary Care Nurse Practitioner [facility] 07/04/2018, 10:33 AM

## 2018-07-04 NOTE — Patient Instructions (Addendum)
Cloyde Reamsracy A Kurowski,   Thank you for coming in to clinic today.  1. Continue your Trazodone 50 mg at bedtime as needed for insomina.  Can take daily if needed, but you are welcome to continue taking 3-4 days per week.   Please schedule a follow-up appointment with Wilhelmina McardleLauren Cherl Gorney, AGNP. Return in about 1 year (around 07/05/2019) for insomnia.  If you have any other questions or concerns, please feel free to call the clinic or send a message through MyChart. You may also schedule an earlier appointment if necessary.  You will receive a survey after today's visit either digitally by e-mail or paper by [employer] mail. Your experiences and feedback matter to us.  Please respond so we know how we are doing as we provide care for you.   Wilhelmina McardleLauren Frady Taddeo, DNP, AGNP-BC Adult Gerontology Nurse Practitioner [facility], [facility]

## 2018-07-06 ENCOUNTER — Ambulatory Visit: Payer: BLUE CROSS/BLUE SHIELD | Admitting: Nurse Practitioner

## 2018-07-12 ENCOUNTER — Encounter: Payer: Self-pay | Admitting: Nurse Practitioner

## 2018-09-19 ENCOUNTER — Ambulatory Visit (INDEPENDENT_AMBULATORY_CARE_PROVIDER_SITE_OTHER): Payer: BLUE CROSS/BLUE SHIELD | Admitting: Nurse Practitioner

## 2018-09-19 VITALS — BP 100/62 | HR 78 | Temp 98.4°F | Resp 15 | Ht 62.0 in | Wt 108.4 lb

## 2018-09-19 DIAGNOSIS — R6889 Other general symptoms and signs: Secondary | ICD-10-CM | POA: Diagnosis not present

## 2018-09-19 LAB — POCT INFLUENZA A/B
Influenza A, POC: NEGATIVE
Influenza B, POC: NEGATIVE

## 2018-09-19 MED ORDER — OSELTAMIVIR PHOSPHATE 75 MG PO CAPS: 75.0000 mg | ORAL_CAPSULE | Freq: Two times a day (BID) | ORAL | 0 refills | Status: AC

## 2018-09-19 NOTE — Patient Instructions (Signed)
Molly Banks,   Thank you for coming in to clinic today.  1. Your flu test was NEGATIVE.  It is possible you can still have the flu with a negative test, otherwise it could be a different virus causing your symptoms.  Because of your positive flu exposures, we will still start Tamiflu. - Start Tamiflu (anti-flu medicine) take one capsule 75mg  twice a day for 5 days - Wash hands and cover cough very well to avoid spread of infection - For symptom control:      - Take Ibuprofen / Advil 400-600mg  every 6-8 hours as needed for fever / muscle aches.  You may also take Tylenol 500-1000mg  per dose every 6-8 hours or 3 times a day.  You can alternate dosing and take both in the same day.      - Do not take more than 3,000 mg acetaminophen (Tylenol) in a day.      - Start OTC Mucinex-DM for cough and congestion for up to 7 days. - Improve hydration with plenty of clear fluids.  Drink up to 8 glasses of water / fluids each day.  If significant worsening with poor fluid intake, worsening fever, difficulty breathing due to coughing, worsening body aches, weakness, or other more concerning symptoms difficulty breathing you can seek treatment at Emergency Department. If your flu symptoms have improved and then get worse several days to a week later with concerns for bronchitis, productive cough, fever, and chills we may need to check for possible pneumonia that can occur after the flu.  Please schedule a follow-up appointment with [name], AGNP in 1-2 weeks as needed if worsening from Flu / Bronchitis  If you have any other questions or concerns, please feel free to call the clinic or send a message through MyChart. You may also schedule an earlier appointment if necessary.  You will receive a survey after today's visit either digitally by e-mail or paper by [employer]. Your experiences and feedback matter to [place].  Please respond so we know how we are doing as we provide care for you.   [name], DNP, AGNP-BC Adult Gerontology Nurse Practitioner [facility], [facility]

## 2018-09-19 NOTE — Progress Notes (Signed)
Subjective:    Patient ID: Molly Banks, female    DOB: 1961-08-03, 58 y.o.   MRN: [id number]  Molly Banks is a 58 y.o. female presenting on 09/19/2018 for Nausea (started today.  Out of work yesterday and today.) and Headache   HPI Nausea and headache Malaise and fatigue yesterday.   Today has nausea, headache, fatigue, mild body aches, mildly increased cough, sore throat in early morning.   No increased congestion.  Is having more frequent BM, but no loose BM.  No noticed fevers, but is having chills and sweats.  Is cold more than normal.  - 3 flu exposures at work.  Cindy sick with flu and lives with sister.   Social History   Tobacco Use  . Smoking status: Current Every Day Smoker    Packs/day: 0.50    Years: 30.00    Pack years: 15.00    Types: Cigarettes  . Smokeless tobacco: Former Neurosurgeon  . Tobacco comment: 14, 20s  Substance Use Topics  . Alcohol use: No  . Drug use: No    Review of Systems Per HPI unless specifically indicated above     Objective:    BP 100/62 (BP Location: Left Arm, Patient Position: Sitting, Cuff Size: Normal)   Pulse 78   Temp 98.4 F (36.9 C) (Oral)   Resp 15   Ht 5\' 2"  (1.575 m)   Wt 108 lb 6.4 oz (49.2 kg)   SpO2 94%   BMI 19.83 kg/m   Wt Readings from Last 3 Encounters:  09/19/18 108 lb 6.4 oz (49.2 kg)  07/04/18 116 lb 12.8 oz (53 kg)  05/08/18 114 lb 6.4 oz (51.9 kg)    Physical Exam Vitals signs reviewed.  Constitutional:      Appearance: She is well-developed.  HENT:     Head: Normocephalic and atraumatic.     Right Ear: Hearing, tympanic membrane, ear canal and external ear normal.     Left Ear: Hearing, tympanic membrane, ear canal and external ear normal.     Nose: Mucosal edema and rhinorrhea present.     Right Sinus: No maxillary sinus tenderness or frontal sinus tenderness.     Left Sinus: Maxillary sinus tenderness present. No frontal sinus tenderness.     Mouth/Throat:     Lips: Pink.     Mouth: Mucous  membranes are moist.     Pharynx: Uvula midline. Posterior oropharyngeal erythema (mildly injected) present. No pharyngeal swelling, oropharyngeal exudate or uvula swelling.     Tonsils: No tonsillar exudate. Swelling: 1+ on the right. 1+ on the left.  Eyes:     General: Lids are normal.        Right eye: No discharge.        Left eye: No discharge.     Conjunctiva/sclera: Conjunctivae normal.     Pupils: Pupils are equal, round, and reactive to light.  Neck:     Musculoskeletal: Full passive range of motion without pain, normal range of motion and neck supple.  Cardiovascular:     Rate and Rhythm: Normal rate and regular rhythm.     Pulses: Normal pulses.     Heart sounds: Normal heart sounds, S1 normal and S2 normal.  Pulmonary:     Effort: Pulmonary effort is normal. No respiratory distress.     Breath sounds: Normal breath sounds. Decreased air movement present.  Musculoskeletal:     Right lower leg: No edema.     Left lower leg: No  edema.  Lymphadenopathy:     Cervical: Cervical adenopathy present.     Right cervical: Superficial cervical adenopathy present.     Left cervical: Superficial cervical adenopathy present.  Skin:    General: Skin is warm and dry.     Capillary Refill: Capillary refill takes less than 2 seconds.  Neurological:     General: No focal deficit present.     Mental Status: She is alert.     GCS: GCS eye subscore is 4. GCS verbal subscore is 5. GCS motor subscore is 6.  Psychiatric:        Attention and Perception: Attention normal.        Mood and Affect: Mood normal.        Behavior: Behavior normal. Behavior is cooperative.       Assessment & Plan:   Problem List Items Addressed This Visit    None    Visit Diagnoses    Flu-like symptoms    -  Primary   Relevant Medications   oseltamivir (TAMIFLU) 75 MG capsule   Other Relevant Orders   POCT Influenza A/B     Clinically diagnosed influenza despite negative rapid flu test today, concern  for flu still due to significant known exposure to positive influenza. - Duration x 24 hours days, withoutout complication. Tolerating PO and well hydrated - No other focal findings of infection today - S/p influenza vaccine this season  Plan: 1. Start Tamiflu 75mg  capsules BID x 5 days 2. Supportive care as advised with NSAID / Tylenol PRN fever/myalgias, improve hydration, may take OTC Cold/Flu meds 3. Zofran ODT PRN nausea/vomiting 4. Return criteria given if significant worsening, consider post-influenza complications, otherwise follow-up if needed     Meds ordered this encounter  Medications  . oseltamivir (TAMIFLU) 75 MG capsule    Sig: Take 1 capsule (75 mg total) by mouth 2 (two) times daily for 5 days.    Dispense:  10 capsule    Refill:  0    Order Specific Question:   Supervising Provider    Answer:   Smitty Cords [[id number]]    Follow up plan: Return if symptoms worsen or fail to improve.  [name], DNP, AGPCNP-BC Adult Gerontology Primary Care Nurse Practitioner [facility] 09/19/2018, 11:43 AM

## 2018-09-21 ENCOUNTER — Encounter: Payer: Self-pay | Admitting: Nurse Practitioner

## 2018-11-06 ENCOUNTER — Telehealth: Payer: Self-pay | Admitting: Nurse Practitioner

## 2018-11-06 ENCOUNTER — Telehealth: Payer: BLUE CROSS/BLUE SHIELD | Admitting: Physician Assistant

## 2018-11-06 DIAGNOSIS — J069 Acute upper respiratory infection, unspecified: Secondary | ICD-10-CM

## 2018-11-06 MED ORDER — BENZONATATE 100 MG PO CAPS: 100.0000 mg | ORAL_CAPSULE | Freq: Three times a day (TID) | ORAL | 0 refills | Status: DC

## 2018-11-06 MED ORDER — IPRATROPIUM BROMIDE 0.06 % NA SOLN: 2.0000 | Freq: Four times a day (QID) | NASAL | 0 refills | Status: DC

## 2018-11-06 NOTE — Progress Notes (Signed)
We are sorry you are not feeling well.  Here is how we plan to help!  Based on what you have shared with me, it looks like you may have a viral upper respiratory infection.  Upper respiratory infections are caused by a large number of viruses; however, rhinovirus is the most common cause.   Symptoms vary from person to person, with common symptoms including sore throat, cough, and fatigue or lack of energy.  A low-grade fever of up to 100.4 may present, but is often uncommon.  Symptoms vary however, and are closely related to a person's age or underlying illnesses.  The most common symptoms associated with an upper respiratory infection are nasal discharge or congestion, cough, sneezing, headache and pressure in the ears and face.  These symptoms usually persist for about 3 to 10 days, but can last up to 2 weeks.  It is important to know that upper respiratory infections do not cause serious illness or complications in most cases.    Upper respiratory infections can be transmitted from person to person, with the most common method of transmission being a person's hands.  The virus is able to live on the skin and can infect other persons for up to 2 hours after direct contact.  Also, these can be transmitted when someone coughs or sneezes; thus, it is important to cover the mouth to reduce this risk.  To keep the spread of the illness at bay, good hand hygiene is very important.  This is an infection that is most likely caused by a virus. There are no specific treatments other than to help you with the symptoms until the infection runs its course.  We are sorry you are not feeling well.  Here is how we plan to help!   For nasal congestion, you may use an oral decongestants such as Mucinex D or if you have glaucoma or high blood pressure use plain Mucinex.  Saline nasal spray or nasal drops can help and can safely be used as often as needed for congestion.  For your congestion, I have prescribed Ipratropium  Bromide nasal spray 0.03% two sprays in each nostril 2-3 times a day  If you do not have a history of heart disease, hypertension, diabetes or thyroid disease, prostate/bladder issues or glaucoma, you may also use Sudafed to treat nasal congestion.  It is highly recommended that you consult with a pharmacist or your primary care physician to ensure this medication is safe for you to take.     If you have a cough, you may use cough suppressants such as Delsym and Robitussin.  If you have glaucoma or high blood pressure, you can also use Coricidin HBP.   For cough I have prescribed for you A prescription cough medication called Tessalon Perles 100 mg. You may take 1-2 capsules every 8 hours as needed for cough  If you have a sore or scratchy throat, use a saltwater gargle-  to  teaspoon of salt dissolved in a 4-ounce to 8-ounce glass of warm water.  Gargle the solution for approximately 15-30 seconds and then spit.  It is important not to swallow the solution.  You can also use throat lozenges/cough drops and Chloraseptic spray to help with throat pain or discomfort.  Warm or cold liquids can also be helpful in relieving throat pain.  For headache, pain or general discomfort, you can use Ibuprofen or Tylenol as directed.   Some authorities believe that zinc sprays or the use of Echinacea   may shorten the course of your symptoms.   HOME CARE Only take medications as instructed by your medical team. Be sure to drink plenty of fluids. Water is fine as well as fruit juices, sodas and electrolyte beverages. You may want to stay away from caffeine or alcohol. If you are nauseated, try taking small sips of liquids. How do you know if you are getting enough fluid? Your urine should be a pale yellow or almost colorless. Get rest. Taking a steamy shower or using a humidifier may help nasal congestion and ease sore throat pain. You can place a towel over your head and breathe in the steam from hot water coming  from a faucet. Using a saline nasal spray works much the same way. Cough drops, hard candies and sore throat lozenges may ease your cough. Avoid close contacts especially the very young and the elderly Cover your mouth if you cough or sneeze Always remember to wash your hands.   GET HELP RIGHT AWAY IF: You develop worsening fever. If your symptoms do not improve within 10 days You develop yellow or green discharge from your nose over 3 days. You have coughing fits You develop a severe head ache or visual changes. You develop shortness of breath, difficulty breathing or start having chest pain  Your symptoms persist after you have completed your treatment plan  MAKE SURE YOU  Understand these instructions. Will watch your condition. Will get help right away if you are not doing well or get worse.  Your e-visit answers were reviewed by a board certified advanced clinical practitioner to complete your personal care plan. Depending upon the condition, your plan could have included both over the counter or prescription medications. Please review your pharmacy choice. If there is a problem, you may call our nursing hot line at and have the prescription routed to another pharmacy. Your safety is important to [place]. If you have drug allergies check your prescription carefully.   You can use MyChart to ask questions about today's visit, request a non-urgent call back, or ask for a work or school excuse for 24 hours related to this e-Visit. If it has been greater than 24 hours you will need to follow up with your provider, or enter a new e-Visit to address those concerns. You will get an e-mail in the next two days asking about your experience.  I hope that your e-visit has been valuable and will speed your recovery. Thank you for using e-visits.      ===View-only below this line===   ----- Message -----    From: Cloyde Reams    Sent: 11/06/2018  3:15 PM EDT      To: E-Visit Mailing  List Subject: E-Visit Submission: Sore Throat  E-Visit Submission: Sore Throat --------------------------------  Question: Do you have any of the following symptoms (please select all that apply)? Answer:   Runny or congested nose            Cough or hoarseness  Question: How long have you been having these symptoms? Answer:   2 days  Question: Do you have a fever? Answer:   Yes, I have a low fever (less than 101 degrees)  Question: How long have you had the fever? Answer:   Just today  Question: Are you in close contact with anyone who has similar symptoms ? Answer:   No  Question: Are you taking any over the counter medications for your symptoms? Answer:   No  Question: Please  list your medication allergies that you may have ? (If 'none' , please list as 'none') Answer:   Tussin  Question: Please list any additional comments  Answer:     Question: Are you breastfeeding? Answer:   No  A total of 5-10 minutes was spent evaluating this patients questionnaire and formulating a plan of care.

## 2018-11-06 NOTE — Telephone Encounter (Signed)
The pt called the office today complaining of cough, low-grade fever 99.5 , mild body aches, headache and chills that started today.The pt have not traveled out of country. She have not come in contact with any actual positive cases of COVID19 or flu.  I recommended that the patient do a E-visit. She confirm access to the E-visit and agreed with the recommendation. No questions or concerns.

## 2018-11-06 NOTE — Telephone Encounter (Signed)
Reviewed and appropriate.  No additional needs today.

## 2018-11-06 NOTE — Telephone Encounter (Signed)
Pt called to make appt for sore throat, low grade fever, chills.  Please call [phone]

## 2018-11-07 ENCOUNTER — Telehealth: Payer: Self-pay

## 2018-11-07 NOTE — Telephone Encounter (Signed)
Need a work note.

## 2019-01-16 ENCOUNTER — Ambulatory Visit (INDEPENDENT_AMBULATORY_CARE_PROVIDER_SITE_OTHER): Payer: Self-pay | Admitting: Family Medicine

## 2019-01-16 ENCOUNTER — Other Ambulatory Visit: Payer: Self-pay

## 2019-01-16 ENCOUNTER — Encounter: Payer: Self-pay | Admitting: Family Medicine

## 2019-01-16 VITALS — BP 97/55 | HR 76 | Temp 98.5°F | Resp 16 | Ht 62.0 in | Wt 112.6 lb

## 2019-01-16 DIAGNOSIS — L237 Allergic contact dermatitis due to plants, except food: Secondary | ICD-10-CM

## 2019-01-16 MED ORDER — HYDROXYZINE HCL 25 MG PO TABS: 25.0000 mg | ORAL_TABLET | Freq: Three times a day (TID) | ORAL | 0 refills | Status: DC | PRN

## 2019-01-16 MED ORDER — METHYLPREDNISOLONE ACETATE 40 MG/ML IJ SUSP
40.0000 mg | Freq: Once | INTRAMUSCULAR | Status: AC
  Administered 2019-01-16: 40 mg via INTRAMUSCULAR

## 2019-01-16 NOTE — Patient Instructions (Addendum)
Thank you for coming to the office today.  It looks like you do have Poison Ivy / Oak Recommend to use topical treatments with Calamine, (may try oatmeal bath), and may take Benadryl at night as needed for itching and sleep.  Steroid injection today - you should be all set  If it returns or does not resolve after >3-5 days - call us and we can send in prednisone steroid pills  If symptoms get worse or do not resolve at all within 2 weeks, please return for re-evaluation. Otherwise, if improved but start to come back, you may call for refill on Prednisone - if develop swelling, redness, fever, and warmth, drainage of pus concern for infection please return sooner. - Try your best to avoid scratching (reduce chances of infection)  In the future, to help prevent poison ivy skin rash it is recommended to thoroughly wash potentially exposed areas with soap or even liquid dish soap (if it is hand safe) within 30 minutes of initial exposure. You can reduce risk of flare up if you wash within 1 to 3 hours, but if you wait more than 3 hours, high likelihood of developing the rash.   Please schedule a Follow-up Appointment to: Return in about 1 week (around 01/23/2019), or if symptoms worsen or fail to improve, for poison ivy.  If you have any other questions or concerns, please feel free to call the office or send a message through MyChart. You may also schedule an earlier appointment if necessary.  Additionally, you may be receiving a survey about your experience at our office within a few days to 1 week by e-mail or mail. We value your feedback.  Saralyn Pilar, DO [facility], [place]

## 2019-01-16 NOTE — Progress Notes (Signed)
Subjective:    Patient ID: Molly Banks, female    DOB: 05-02-1961, 58 y.o.   MRN: [id number]  DEE FRITCHMAN is a 58 y.o. female presenting on 01/16/2019 for [facility] (onset 3 days )  PCP is [name], AGPCNP-BC - I am currently covering during her maternity leave.   HPI   Poison [place] Dermatitis, contact allergic Reports symptoms started about 3 days ago with known poison ivy exposure, similar to previous episodes, with several areas of raised red itchy rash on both arms and forearms, she said they were burning brush and she got close contact with the poison ivy, similar reaction to past that was treated with PCP by steroid injection in office that lasted up 6 months, she has failed oral prednisone pills before, she said she has family history sister similar reaction - Admits itching, trying to limit scratching - using topical anti itch and benadryl, calamine - Admits mostly on arms, some small areas spread to face, L cheek - Admits some soreness on forearm over lesions - Denies any fever chills, drainage of pus, nausea vomiting swelling, dyspnea   Depression screen [facility] 2/9 01/16/2019 07/04/2018 04/05/2018  Decreased Interest 0 0 0  Down, Depressed, Hopeless 0 1 1  PHQ - 2 Score 0 1 1  Altered sleeping - 0 3  Tired, decreased energy - 1 2  Change in appetite - 0 1  Feeling bad or failure about yourself  - 0 0  Trouble concentrating - 0 0  Moving slowly or fidgety/restless - 0 0  Suicidal thoughts - 0 0  PHQ-9 Score - 2 7  Difficult doing work/chores - Not difficult at all Not difficult at all    Social History   Tobacco Use  . Smoking status: Current Every Day Smoker    Packs/day: 0.50    Years: 30.00    Pack years: 15.00    Types: Cigarettes  . Smokeless tobacco: Former Neurosurgeon  . Tobacco comment: 14, 20s  Substance Use Topics  . Alcohol use: No  . Drug use: No    Review of Systems Per HPI unless specifically indicated above     Objective:    BP (!) 97/55    Pulse 76   Temp 98.5 F (36.9 C) (Oral)   Resp 16   Ht 5\' 2"  (1.575 m)   Wt 112 lb 9.6 oz (51.1 kg)   BMI 20.59 kg/m   Wt Readings from Last 3 Encounters:  01/16/19 112 lb 9.6 oz (51.1 kg)  09/19/18 108 lb 6.4 oz (49.2 kg)  07/04/18 116 lb 12.8 oz (53 kg)    Physical Exam Vitals signs and nursing note reviewed.  Constitutional:      General: She is not in acute distress.    Appearance: She is well-developed. She is not diaphoretic.     Comments: Well-appearing, comfortable, cooperative  HENT:     Head: Normocephalic and atraumatic.  Eyes:     General:        Right eye: No discharge.        Left eye: No discharge.     Conjunctiva/sclera: Conjunctivae normal.  Cardiovascular:     Rate and Rhythm: Normal rate.  Pulmonary:     Effort: Pulmonary effort is normal.  Skin:    General: Skin is warm and dry.     Findings: Erythema and rash (Extensive linear excoriated raised vesicular rash with some erythematous patches with some scab and some healing drying areas on  forearms, mild involvement on face) present.  Neurological:     Mental Status: She is alert and oriented to person, place, and time.  Psychiatric:        Behavior: Behavior normal.     Comments: Well groomed, good eye contact, normal speech and thoughts        Assessment & Plan:   Problem List Items Addressed This Visit    None    Visit Diagnoses    Contact dermatitis due to poison ivy    -  Primary   Relevant Medications   hydrOXYzine (ATARAX/VISTARIL) 25 MG tablet   methylPREDNISolone acetate (DEPO-MEDROL) injection 40 mg (Completed)      Consistent with poison ivy contact dermatitis, x 3 day duration. Patient with known h/o poison ivy, similar with previous exposures - Afebrile, no evidence of bacterial superinfection from scratching  Plan: 1. Given Depo Medrol 40mg  IM x 1 dose injection today - as this has worked for her in past, [facility] and she verbally  declined oral prdnisone and preferred steroid injection 2.Start Hydroxyzine 25mg  TID PRN for itching . Start OTC Benadryl PRN itching, may try topical calamine lotion. Advised don't take benadryl if on hydroxyzine. 3. Avoid scratching 4. Return criteria given, 1 to 2 weeks if worsening or not improving   Meds ordered this encounter  Medications  . hydrOXYzine (ATARAX/VISTARIL) 25 MG tablet    Sig: Take 1 tablet (25 mg total) by mouth 3 (three) times daily as needed for itching.    Dispense:  30 tablet    Refill:  0  . methylPREDNISolone acetate (DEPO-MEDROL) injection 40 mg    Follow up plan: Return in about 1 week (around 01/23/2019), or if symptoms worsen or fail to improve, for poison ivy.   Saralyn PilarAlexander Aune Adami, DO [facility] 01/16/2019, 2:19 PM

## 2019-04-08 ENCOUNTER — Encounter: Payer: BLUE CROSS/BLUE SHIELD | Admitting: Nurse Practitioner

## 2019-05-29 ENCOUNTER — Other Ambulatory Visit: Payer: Self-pay

## 2019-05-29 DIAGNOSIS — Z20822 Contact with and (suspected) exposure to covid-19: Secondary | ICD-10-CM

## 2019-05-31 LAB — NOVEL CORONAVIRUS, NAA: SARS-CoV-2, NAA: NOT DETECTED

## 2019-09-17 ENCOUNTER — Encounter: Payer: Self-pay | Admitting: Emergency Medicine

## 2019-09-17 ENCOUNTER — Other Ambulatory Visit: Payer: Self-pay

## 2019-09-17 ENCOUNTER — Emergency Department
Admission: EM | Admit: 2019-09-17 | Discharge: 2019-09-17 | Disposition: A | Discharge status: Home / Self Care | Payer: BC Managed Care – PPO | Attending: Emergency Medicine | Admitting: Emergency Medicine

## 2019-09-17 DIAGNOSIS — Y999 Unspecified external cause status: Secondary | ICD-10-CM | POA: Diagnosis not present

## 2019-09-17 DIAGNOSIS — Y939 Activity, unspecified: Secondary | ICD-10-CM | POA: Insufficient documentation

## 2019-09-17 DIAGNOSIS — K0889 Other specified disorders of teeth and supporting structures: Secondary | ICD-10-CM | POA: Diagnosis present

## 2019-09-17 DIAGNOSIS — Y929 Unspecified place or not applicable: Secondary | ICD-10-CM | POA: Insufficient documentation

## 2019-09-17 DIAGNOSIS — F17210 Nicotine dependence, cigarettes, uncomplicated: Secondary | ICD-10-CM | POA: Diagnosis not present

## 2019-09-17 DIAGNOSIS — Z79899 Other long term (current) drug therapy: Secondary | ICD-10-CM | POA: Insufficient documentation

## 2019-09-17 DIAGNOSIS — K047 Periapical abscess without sinus: Secondary | ICD-10-CM | POA: Insufficient documentation

## 2019-09-17 DIAGNOSIS — S025XXB Fracture of tooth (traumatic), initial encounter for open fracture: Secondary | ICD-10-CM

## 2019-09-17 DIAGNOSIS — S025XXA Fracture of tooth (traumatic), initial encounter for closed fracture: Secondary | ICD-10-CM | POA: Insufficient documentation

## 2019-09-17 DIAGNOSIS — X58XXXA Exposure to other specified factors, initial encounter: Secondary | ICD-10-CM | POA: Insufficient documentation

## 2019-09-17 MED ORDER — ACETAMINOPHEN 325 MG PO TABS
650.0000 mg | ORAL_TABLET | Freq: Once | ORAL | Status: AC
  Administered 2019-09-17: 650 mg via ORAL
  Filled 2019-09-17: qty 2

## 2019-09-17 MED ORDER — LIDOCAINE VISCOUS HCL 2 % MT SOLN
15.0000 mL | Freq: Once | OROMUCOSAL | Status: AC
  Administered 2019-09-17: 15 mL via OROMUCOSAL
  Filled 2019-09-17: qty 15

## 2019-09-17 MED ORDER — AMOXICILLIN 500 MG PO CAPS
500.0000 mg | ORAL_CAPSULE | Freq: Once | ORAL | Status: AC
  Administered 2019-09-17: 500 mg via ORAL
  Filled 2019-09-17: qty 1

## 2019-09-17 MED ORDER — AMOXICILLIN 500 MG PO CAPS: 500.0000 mg | ORAL_CAPSULE | Freq: Three times a day (TID) | ORAL | 0 refills | Status: DC

## 2019-09-17 MED ORDER — LIDOCAINE VISCOUS HCL 2 % MT SOLN: 10.0000 mL | OROMUCOSAL | 0 refills | Status: DC | PRN

## 2019-09-17 NOTE — ED Triage Notes (Addendum)
Pt reports she had front, bottom tooth break off 2 months ago and then started to swell and cause pain on Saturday. Pt seen at walk in clinic on Saturday and was prescribed clindamycin and Ibuprofen. Pt to ED this AM due to unrelieved symptoms. Pt denies discharge and fever.

## 2019-09-17 NOTE — Discharge Instructions (Signed)
OPTIONS FOR DENTAL FOLLOW UP CARE ° °[place] Department of Health and Human Services - Local Safety Net Dental Clinics °http://www.ncdhhs.gov/dph/oralhealth/services/safetynetclinics.htm °  °[facility] (336-562-3123) ° °[facility] [place] (919-933-9087) ° °[facility] ([phone] ext [phone]) ° °[facility] ([phone]) ° °[facility] ([phone]) °This clinic caters to the indigent population and is on a lottery system. °Location: °[facility], [address], [place] °Clinic Hours: °Wednesdays from 6pm - 9pm, patients seen by a lottery system. °For dates, call or go to [email or web address].edu/[facility]/patients/Dental-[facility] °Services: °Cleanings, fillings and simple extractions. °Payment Options: °DENTAL WORK IS FREE OF CHARGE. Bring proof of income or support. °Best way to get seen: °Arrive at 5:15 pm - this is a lottery, NOT first come/first serve, so arriving earlier will not increase your chances of being seen. °  °  °[facility] °[phone] °Select option 1 for emergencies °  °Location: °[facility], [address], [place] °Clinic Hours: °No walk-ins accepted - call the day before to schedule an appointment. °Check in times are 9:30 am and 1:30 pm. °Services: °Simple extractions, temporary fillings, pulpectomy/pulp debridement, uncomplicated abscess drainage. °Payment Options: °PAYMENT IS DUE AT THE TIME OF SERVICE.  Fee is usually $100-200, additional surgical procedures (e.g. abscess drainage) may be extra. °Cash, checks, Visa/MasterCard accepted.  Can file Medicaid if patient is covered for dental - patient should call case worker to check. °No discount for [facility] patients. °Best way to get seen: °MUST call the day before and get onto the schedule. Can usually be seen the next 1-2 days. No walk-ins accepted. °  °  °[facility] °[phone] °   °Location: °[facility], [address], [place] °Clinic Hours: °M, W, Th, F 8am or 1:30pm, Tues 9a or 1:30 - first come/first served. °Services: °Simple extractions, temporary fillings, uncomplicated abscess drainage.  You do not need to be an [place] resident. °Payment Options: °PAYMENT IS DUE AT THE TIME OF SERVICE. °Dental insurance, otherwise sliding scale - bring proof of income or support. °Depending on income and treatment needed, cost is usually $50-200. °Best way to get seen: °Arrive early as it is first come/first served. °  °  °[facility] °[phone] °  °Location: °[address] °Clinic Hours: °Mon-Thu 8a-5p °Services: °Most basic dental services including extractions and fillings. °Payment Options: °PAYMENT IS DUE AT THE TIME OF SERVICE. °Sliding scale, up to 50% off - bring proof if income or support. °Medicaid with dental option accepted. °Best way to get seen: °Call to schedule an appointment, can usually be seen within 2 weeks OR they will try to see walk-ins - show up at 8a or 2p (you may have to wait). °  °  °[facility] °[phone] °[place] RESIDENTS ONLY °  °Location: °[facility], [address], [place], [place] [address] °Clinic Hours: By appointment only. °Monday - Thursday 8am-5pm, Friday 8am-12pm °Services: Cleanings, fillings, extractions. °Payment Options: °PAYMENT IS DUE AT THE TIME OF SERVICE. °Cash, Visa or MasterCard. Sliding scale - $30 minimum per service. °Best way to get seen: °Come in to office, complete packet and make an appointment - need proof of income °or support monies for each household member and proof of [place] residence. °Usually takes about a month to get in. °  °  °[facility] °[phone] °  °Location: °[address]   [place] °Clinic Hours: Walk-in Urgent Care Dental Services are offered Monday-Friday  mornings only. °The numbers of emergencies accepted daily is limited to the number of °providers available. °Maximum 15 - Mondays, Wednesdays & Thursdays °Maximum 10 - Tuesdays & Fridays °Services: °You do not need to be a [place] resident to be seen for a dental emergency. °Emergencies are defined as pain, swelling, abnormal bleeding, or dental trauma. Walkins will receive x-rays if needed. °NOTE: Dental cleaning is not an emergency. °Payment Options: °PAYMENT IS DUE AT THE TIME OF SERVICE. °Minimum co-pay is $40.00 for uninsured patients. °Minimum co-pay is $3.00 for Medicaid with dental coverage. °Dental Insurance is accepted and must be presented at time of visit. °Medicare does not cover dental. °Forms of payment: Cash, credit card, checks. °Best way to get seen: °If not previously registered with the clinic, walk-in dental registration begins at 7:15 am and is on a first come/first serve basis. °If previously registered with the clinic, call to make an appointment. °  °  °[facility] °[phone] °[place] RESIDENTS ONLY °  °Location: °[address], [place], [place] °Clinic Hours: °Mon-Thu 10a-2p °Services: Extractions only! °Payment Options: °FREE (donations accepted) - bring proof of income or support °Best way to get seen: °Call and schedule an appointment OR come at 8am on the 1st Monday of every month (except for holidays) when it is first come/first served. °  °  °[facility] °[phone] °  °Location: °[address], [place] °Clinic Hours: °Friday mornings °Services, Payment Options, Best way to get seen: °Call for info °

## 2019-09-17 NOTE — ED Notes (Signed)
See triage note  States she broke a tooth about 2 months ago   States she developed pain about 3-4 days ago  Was seen at urgent care placed on clindamycin   States pain and swelling are getting worse   Swelling noted to right lower gum line  Low grade temp noted on arrival

## 2019-09-17 NOTE — ED Provider Notes (Signed)
[facility] Emergency Department Provider Note  ____________________________________________  Time seen: Approximately 7:54 AM  I have reviewed the triage vital signs and the nursing notes.   HISTORY  Chief Complaint Dental Pain    HPI Molly Banks is a 59 y.o. female that presents to the emergency department for evaluation of broken tooth.  Patient states that tooth broke about 2 months ago.  Patient states that she went to urgent care 3 days ago and requested amoxicillin because this usually resolves her symptoms but she was given clindamycin inset.  Her pain is worsening.  No difficulty swallowing. No fever.   Past Medical History:  Diagnosis Date  . Arthritis   . Breast lump in female 2005  . COPD (chronic obstructive pulmonary disease) ([place])   . Emphysema lung ([place])   . Erythrocytosis   . Frequent headaches    tension related  . GERD (gastroesophageal reflux disease)    h/o  . Hypoglycemia   . Hypoglycemia   . Leukocytosis   . Thoracic scoliosis    Left concavity  . Thyroid nodule    right side    Patient Active Problem List   Diagnosis Date Noted  . Primary insomnia 04/05/2018  . Urge incontinence 05/01/2017  . BRCA negative 05/01/2017  . Screening for malignant neoplasm of respiratory organ 02/04/2016  . B12 deficiency 02/04/2016  . Cervical spondylosis with radiculopathy 05/08/2015  . Secondary erythrocytosis 03/02/2015  . Bilateral carpal tunnel syndrome 07/29/2014  . Chronic obstructive pulmonary disease ([place]) 07/29/2014  . History of cardiac cath 07/01/2014  . Hyperlipidemia, mixed 06/13/2014  . Abnormal cardiovascular stress test 06/13/2014  . Tobacco use disorder, continuous 06/04/2014  . H/O endoscopy 12/27/2013    Past Surgical History:  Procedure Laterality Date  . ABDOMINAL HYSTERECTOMY     oophrectomy NOT perfomed  . BREAST BIOPSY Left    [place]  . CHOLECYSTECTOMY    . SHOULDER ARTHROSCOPY WITH BICEPSTENOTOMY  Right 07/31/2017   Procedure: SHOULDER ARTHROSCOPY WITH BICEPSTENOTOMY;  Surgeon: Lovell Sheehan, MD;  Location: [facility] ORS;  Service: Orthopedics;  Laterality: Right;  . SHOULDER ARTHROSCOPY WITH SUBACROMIAL DECOMPRESSION Right 07/31/2017   Procedure: SHOULDER ARTHROSCOPY WITH SUBACROMIAL DECOMPRESSION;  Surgeon: Lovell Sheehan, MD;  Location: [facility] ORS;  Service: Orthopedics;  Laterality: Right;  Limited debridement  . TOE SURGERY Left    4th toe  . TONSILLECTOMY  1966    Prior to Admission medications   Medication Sig Start Date End Date Taking? Authorizing Provider  clindamycin (CLEOCIN) 300 MG capsule Take 300 mg by mouth 3 (three) times daily.   Yes [provider]  ibuprofen (ADVIL) 800 MG tablet Take 800 mg by mouth every 8 (eight) hours as needed.   Yes [provider]  amoxicillin (AMOXIL) 500 MG capsule Take 1 capsule (500 mg total) by mouth 3 (three) times daily. 09/17/19   Laban Emperor, PA-C  lidocaine (XYLOCAINE) 2 % solution Use as directed 10 mLs in the mouth or throat as needed. 09/17/19   Laban Emperor, PA-C  traZODone (DESYREL) 50 MG tablet Take 1 tablet (50 mg total) by mouth at bedtime as needed for sleep. 07/04/18   Mikey College, NP    Allergies Guaifenesin, [place]-df [hydrocodone-guaifenesin], and Mobic [meloxicam]  Family History  Problem Relation Age of Onset  . Brain cancer Mother   . Cancer Mother        brain  . Hiatal hernia Mother   . Lung cancer Father   .  COPD Father   . Cancer - Lung Father   . Lymphoma Sister   . Breast cancer Sister 51       BRCA positive test  . Breast cancer Daughter 85  . Breast cancer Maternal Grandmother   . Lung cancer Maternal Grandfather   . Ovarian cancer Neg Hx   . Colon cancer Neg Hx   . Stroke Neg Hx   . Heart disease Neg Hx     Social History Social History   Tobacco Use  . Smoking status: Current Every Day Smoker    Packs/day: 0.50    Years: 30.00    Pack years: 15.00     Types: Cigarettes  . Smokeless tobacco: Former Systems developer  . Tobacco comment: 14, 20s  Substance Use Topics  . Alcohol use: No  . Drug use: No     Review of Systems  Constitutional: No fever/chills Cardiovascular: No chest pain. Respiratory: No SOB. Gastrointestinal: No abdominal pain.  No nausea, no vomiting.  Musculoskeletal: Negative for musculoskeletal pain. Skin: Negative for rash, abrasions, lacerations, ecchymosis. Neurological: Negative for headaches   ____________________________________________   PHYSICAL EXAM:  VITAL SIGNS: ED Triage Vitals [09/17/19 0651]  Enc Vitals Group     BP (!) 149/91     Pulse Rate 89     Resp 17     Temp 99.1 F (37.3 C)     Temp Source Oral     SpO2 97 %     Weight      Height      Head Circumference      Peak Flow      Pain Score      Pain Loc      Pain Edu?      Excl. in [place]?      Constitutional: Alert and oriented. Well appearing and in no acute distress. Eyes: Conjunctivae are normal. PERRL. EOMI. Head: Atraumatic. ENT:      Ears:      Nose: No congestion/rhinnorhea.      Mouth/Throat: Mucous membranes are moist.  Large cavity to bottom lateral incisor. No swelling. Full ROM of jaw. Neck: No stridor. Cardiovascular: Normal rate, regular rhythm.  Good peripheral circulation. Respiratory: Normal respiratory effort without tachypnea or retractions. Lungs CTAB. Good air entry to the bases with no decreased or absent breath sounds. Musculoskeletal: Full range of motion to all extremities. No gross deformities appreciated. Neurologic:  Normal speech and language. No gross focal neurologic deficits are appreciated.  Skin:  Skin is warm, dry and intact. No rash noted. Psychiatric: Mood and affect are normal. Speech and behavior are normal. Patient exhibits appropriate insight and judgement.   ____________________________________________   LABS (all labs ordered are listed, but only abnormal results are displayed)  Labs  Reviewed - No data to display ____________________________________________  EKG   ____________________________________________  RADIOLOGY  No results found.  ____________________________________________    PROCEDURES  Procedure(s) performed:    Procedures    Medications  lidocaine (XYLOCAINE) 2 % viscous mouth solution 15 mL (15 mLs Mouth/Throat Given 09/17/19 0816)  acetaminophen (TYLENOL) tablet 650 mg (650 mg Oral Given 09/17/19 0816)  amoxicillin (AMOXIL) capsule 500 mg (500 mg Oral Given 09/17/19 0816)     ____________________________________________   INITIAL IMPRESSION / ASSESSMENT AND PLAN / ED COURSE  Pertinent labs & imaging results that were available during my care of the patient were reviewed by me and considered in my medical decision making (see chart for details).  Review of the  [place] CSRS was performed in accordance of the [place] prior to dispensing any controlled drugs.    Patient's diagnosis is consistent with dental infection. Patient will be discharged home with prescriptions for amoxicillin and viscous lidocaine. Patient is to follow up with dentist as directed. Dental resources were provided. Patient is given ED precautions to return to the ED for any worsening or new symptoms.  Molly Banks was evaluated in Emergency Department on 09/17/2019 for the symptoms described in the history of present illness. She was evaluated in the context of the global COVID-19 pandemic, which necessitated consideration that the patient might be at risk for infection with the SARS-CoV-2 virus that causes COVID-19. Institutional protocols and algorithms that pertain to the evaluation of patients at risk for COVID-19 are in a state of rapid change based on information released by regulatory bodies including the CDC and federal and state organizations. These policies and algorithms were followed during the patient's care in the  ED.   ____________________________________________  FINAL CLINICAL IMPRESSION(S) / ED DIAGNOSES  Final diagnoses:  Open fracture of tooth, initial encounter  Dental infection      NEW MEDICATIONS STARTED DURING THIS VISIT:  ED Discharge Orders         Ordered    lidocaine (XYLOCAINE) 2 % solution  As needed     09/17/19 0808    amoxicillin (AMOXIL) 500 MG capsule  3 times daily     09/17/19 1438              This chart was dictated using voice recognition software/Dragon. Despite best efforts to proofread, errors can occur which can change the meaning. Any change was purely unintentional.    Laban Emperor, PA-C 09/17/19 1116    Duffy Bruce, MD 09/18/19 [phone]

## 2019-12-12 ENCOUNTER — Encounter: Payer: Self-pay | Admitting: Family Medicine

## 2019-12-12 ENCOUNTER — Ambulatory Visit (INDEPENDENT_AMBULATORY_CARE_PROVIDER_SITE_OTHER): Payer: BC Managed Care – PPO | Admitting: Family Medicine

## 2019-12-12 ENCOUNTER — Other Ambulatory Visit: Payer: Self-pay

## 2019-12-12 VITALS — BP 126/69 | HR 80 | Temp 97.1°F | Wt 107.0 lb

## 2019-12-12 DIAGNOSIS — D751 Secondary polycythemia: Secondary | ICD-10-CM

## 2019-12-12 DIAGNOSIS — Z1231 Encounter for screening mammogram for malignant neoplasm of breast: Secondary | ICD-10-CM | POA: Diagnosis not present

## 2019-12-12 DIAGNOSIS — Z0000 Encounter for general adult medical examination without abnormal findings: Secondary | ICD-10-CM | POA: Diagnosis not present

## 2019-12-12 DIAGNOSIS — Z13820 Encounter for screening for osteoporosis: Secondary | ICD-10-CM | POA: Diagnosis not present

## 2019-12-12 DIAGNOSIS — E782 Mixed hyperlipidemia: Secondary | ICD-10-CM | POA: Diagnosis not present

## 2019-12-12 DIAGNOSIS — R3129 Other microscopic hematuria: Secondary | ICD-10-CM

## 2019-12-12 LAB — POCT URINALYSIS DIPSTICK
Bilirubin, UA: NEGATIVE
Glucose, UA: NEGATIVE
Ketones, UA: NEGATIVE
Leukocytes, UA: NEGATIVE
Nitrite, UA: NEGATIVE
Protein, UA: NEGATIVE
Spec Grav, UA: <=1.005 — AB (ref 1.010–1.025)
Urobilinogen, UA: 0.2 E.U./dL
pH, UA: 5 (ref 5.0–8.0)

## 2019-12-12 NOTE — Progress Notes (Signed)
Subjective:    Patient ID: Molly Banks, female    DOB: 02/22/1961, 59 y.o.   MRN: [id number]  Molly Banks is a 59 y.o. female presenting on 12/12/2019 for Annual Exam   HPI  HEALTH MAINTENANCE:  Weight/BMI: BMI within healthy range Physical activity: No structured exercise Diet: Regular Seatbelt: Yes Mammogram: Due, ordered DEXA: Due, ordered Colon cancer screening: Completed, due 2025 Optometry: As needed Dentistry: As needed  IMMUNIZATIONS: Influenza: Due next season Tetanus: Due, offered and declined   Depression screen Molly Banks 2/9 01/16/2019 07/04/2018 04/05/2018  Decreased Interest 0 0 0  Down, Depressed, Hopeless 0 1 1  PHQ - 2 Score 0 1 1  Altered sleeping - 0 3  Tired, decreased energy - 1 2  Change in appetite - 0 1  Feeling bad or failure about yourself  - 0 0  Trouble concentrating - 0 0  Moving slowly or fidgety/restless - 0 0  Suicidal thoughts - 0 0  PHQ-9 Score - 2 7  Difficult doing work/chores - Not difficult at all Not difficult at all    Past Medical History:  Diagnosis Date  . Arthritis   . Breast lump in female 2005  . COPD (chronic obstructive pulmonary disease) (Molly Banks)   . Emphysema lung (Molly Banks)   . Erythrocytosis   . Frequent headaches    tension related  . GERD (gastroesophageal reflux disease)    h/o  . Hypoglycemia   . Hypoglycemia   . Leukocytosis   . Thoracic scoliosis    Left concavity  . Thyroid nodule    right side   Past Surgical History:  Procedure Laterality Date  . ABDOMINAL HYSTERECTOMY     oophrectomy NOT perfomed  . BREAST BIOPSY Left    [place]  . CHOLECYSTECTOMY    . SHOULDER ARTHROSCOPY WITH BICEPSTENOTOMY Right 07/31/2017   Procedure: SHOULDER ARTHROSCOPY WITH BICEPSTENOTOMY;  Surgeon: Molly Sheehan, MD;  Location: [facility] ORS;  Service: Orthopedics;  Laterality: Right;  . SHOULDER ARTHROSCOPY WITH SUBACROMIAL DECOMPRESSION Right 07/31/2017   Procedure: SHOULDER ARTHROSCOPY WITH SUBACROMIAL DECOMPRESSION;   Surgeon: Molly Sheehan, MD;  Location: [facility] ORS;  Service: Orthopedics;  Laterality: Right;  Limited debridement  . TOE SURGERY Left    4th toe  . TONSILLECTOMY  1966   Social History   Socioeconomic History  . Marital status: Married    Spouse name: Not on file  . Number of children: Not on file  . Years of education: Not on file  . Highest education level: Not on file  Occupational History  . Not on file  Tobacco Use  . Smoking status: Current Every Day Smoker    Packs/day: 0.50    Years: 30.00    Pack years: 15.00    Types: Cigarettes  . Smokeless tobacco: Former Systems developer  . Tobacco comment: 14, 20s  Substance and Sexual Activity  . Alcohol use: No  . Drug use: No  . Sexual activity: Not on file  Other Topics Concern  . Not on file  Social History Narrative  . Not on file   Social Determinants of Health   Financial Resource Strain:   . Difficulty of Paying Living Expenses:   Food Insecurity:   . Worried About Charity fundraiser in the Last Year:   . Arboriculturist in the Last Year:   Transportation Needs:   . Film/video editor (Medical):   Molly Banks Kitchen Lack of Transportation (Non-Medical):   Physical Activity:   .  Days of Exercise per Week:   . Minutes of Exercise per Session:   Stress:   . Feeling of Stress :   Social Connections:   . Frequency of Communication with Friends and Family:   . Frequency of Social Gatherings with Friends and Family:   . Attends Religious Services:   . Active Member of Clubs or Organizations:   . Attends Archivist Meetings:   Molly Banks Kitchen Marital Status:   Intimate Partner Violence:   . Fear of Current or Ex-Partner:   . Emotionally Abused:   Molly Banks Kitchen Physically Abused:   . Sexually Abused:    Family History  Problem Relation Age of Onset  . Brain cancer Mother   . Cancer Mother        brain  . Hiatal hernia Mother   . Lung cancer Father   . COPD Father   . Cancer - Lung Father   . Lymphoma Sister   . Breast cancer Sister 72        BRCA positive test  . Breast cancer Daughter 54  . Breast cancer Maternal Grandmother   . Lung cancer Maternal Grandfather   . Ovarian cancer Neg Hx   . Colon cancer Neg Hx   . Stroke Neg Hx   . Heart disease Neg Hx    Current Outpatient Medications on File Prior to Visit  Medication Sig  . ibuprofen (ADVIL) 800 MG tablet Take 800 mg by mouth every 8 (eight) hours as needed.   No current facility-administered medications on file prior to visit.    Per HPI unless specifically indicated above     Objective:    BP 126/69 (BP Location: Left Arm, Patient Position: Sitting, Cuff Size: Small)   Pulse 80   Temp (!) 97.1 F (36.2 C) (Temporal)   Wt 107 lb (48.5 kg)   BMI 19.57 kg/m   Wt Readings from Last 3 Encounters:  12/12/19 107 lb (48.5 kg)  01/16/19 112 lb 9.6 oz (51.1 kg)  09/19/18 108 lb 6.4 oz (49.2 kg)    Physical Exam Vitals reviewed.  Constitutional:      General: She is not in acute distress.    Appearance: Normal appearance. She is well-developed, well-groomed and normal weight. She is not ill-appearing or toxic-appearing.  HENT:     Head: Normocephalic.     Right Ear: Tympanic membrane, ear canal and external ear normal. There is no impacted cerumen.     Left Ear: Tympanic membrane, ear canal and external ear normal. There is no impacted cerumen.     Nose:     Comments: Facemask in place over nose and mouth    Mouth/Throat:     Lips: Pink.     Pharynx: Uvula midline.  Eyes:     General: Lids are normal. Vision grossly intact. No scleral icterus.       Right eye: No discharge.        Left eye: No discharge.     Extraocular Movements: Extraocular movements intact.     Conjunctiva/sclera: Conjunctivae normal.     Pupils: Pupils are equal, round, and reactive to light.  Neck:     Thyroid: No thyroid mass or thyromegaly.  Cardiovascular:     Rate and Rhythm: Normal rate and regular rhythm.     Pulses: Normal pulses.          Dorsalis pedis pulses are 2+  on the right side and 2+ on the left side.  Posterior tibial pulses are 2+ on the right side and 2+ on the left side.     Heart sounds: Normal heart sounds. No murmur. No friction rub. No gallop.   Pulmonary:     Effort: Pulmonary effort is normal. No respiratory distress.     Breath sounds: Normal breath sounds.  Abdominal:     General: Abdomen is flat. Bowel sounds are normal. There is no distension.     Palpations: Abdomen is soft. There is no hepatomegaly, splenomegaly or mass.     Tenderness: There is no abdominal tenderness. There is no guarding or rebound.     Hernia: No hernia is present.  Musculoskeletal:        General: Normal range of motion.     Cervical back: Normal range of motion and neck supple. No tenderness.     Right lower leg: No edema.     Left lower leg: No edema.  Feet:     Right foot:     Skin integrity: Skin integrity normal.     Left foot:     Skin integrity: Skin integrity normal.  Lymphadenopathy:     Cervical: No cervical adenopathy.  Skin:    General: Skin is warm and dry.     Capillary Refill: Capillary refill takes less than 2 seconds.  Neurological:     General: No focal deficit present.     Mental Status: She is alert and oriented to person, place, and time.     Cranial Nerves: No cranial nerve deficit.     Sensory: No sensory deficit.     Motor: No weakness.     Coordination: Coordination normal.     Gait: Gait normal.     Deep Tendon Reflexes: Reflexes normal.  Psychiatric:        Attention and Perception: Attention and perception normal.        Mood and Affect: Mood and affect normal.        Speech: Speech normal.        Behavior: Behavior normal. Behavior is cooperative.        Thought Content: Thought content normal.        Cognition and Memory: Cognition and memory normal.        Judgment: Judgment normal.     Results for orders placed or performed in visit on 12/12/19  POCT Urinalysis Dipstick  Result Value Ref Range    Color, UA light yellow    Clarity, UA clear    Glucose, UA Negative Negative   Bilirubin, UA negative    Ketones, UA negative    Spec Grav, UA <=1.005 (A) 1.010 - 1.025   Blood, UA large    pH, UA 5.0 5.0 - 8.0   Protein, UA Negative Negative   Urobilinogen, UA 0.2 0.2 or 1.0 E.U./dL   Nitrite, UA Negative    Leukocytes, UA Negative Negative   Appearance     Odor        Assessment & Plan:   Problem List Items Addressed This Visit      Other   Secondary erythrocytosis   Relevant Orders   CBC with Differential   COMPLETE METABOLIC PANEL WITH GFR   Hyperlipidemia, mixed   Relevant Orders   Lipid Profile   Routine medical exam    Annual physical exam without new findings.  Well adult with no acute concerns.  Plan: 1. Obtain health maintenance screenings as above according to age. - Increase physical activity to 30 minutes  most days of the week.  - Eat healthy diet high in vegetables and fruits; low in refined carbohydrates. - Screening labs and tests as ordered -Mammogram, DEXA 2. Return 1 year for annual physical.       Relevant Orders   CBC with Differential   COMPLETE METABOLIC PANEL WITH GFR   Lipid Profile   Thyroid Panel With TSH    Other Visit Diagnoses    Screening for osteoporosis    -  Primary   Relevant Orders   DG Bone Density   Encounter for screening mammogram for malignant neoplasm of breast       Relevant Orders   MM 3D SCREEN BREAST BILATERAL   Microscopic hematuria       Relevant Orders   POCT Urinalysis Dipstick (Completed)   Urinalysis, microscopic only      No orders of the defined types were placed in this encounter.     Follow up plan: Return in about 1 year (around 12/11/2020) for Physical.  Harlin Rain, FNP-C Family Nurse Practitioner [facility] 12/12/2019, 8:23 AM

## 2019-12-12 NOTE — Patient Instructions (Addendum)
As we discussed, have your labs drawn in the next 1-2 weeks and we will contact you with the results.  For Mammogram screening for breast cancer and DEXA Scan (Bone mineral density) screening for osteoporosis  Call the Imaging Center below anytime to schedule your own appointment now that order has been placed.  [facility] [address] [place], [place] [address] Phone: [phone]  [facility] [address] [place], [place] [address] Phone: [phone]  Continue to work on healthy eating habits, try for exercise most days per week for at least 30 minutes, and wear sunscreen when you are out in the sun.  We will plan to see you back in 1 year for your next physical, and as needed for any acute visits  You will receive a survey after today's visit either digitally by e-mail or paper by [employer]. Your experiences and feedback matter to [place].  Please respond so we know how we are doing as we provide care for you.  Call us with any questions/concerns/needs.  It is my goal to be available to you for your health concerns.  Thanks for choosing me to be a partner in your healthcare needs!  Charlaine Dalton, FNP-C Family Nurse Practitioner [facility] Phone: [phone]

## 2019-12-12 NOTE — Assessment & Plan Note (Signed)
Annual physical exam without new findings.  Well adult with no acute concerns.  Plan: 1. Obtain health maintenance screenings as above according to age. - Increase physical activity to 30 minutes most days of the week.  - Eat healthy diet high in vegetables and fruits; low in refined carbohydrates. - Screening labs and tests as ordered -Mammogram, DEXA 2. Return 1 year for annual physical.

## 2019-12-13 LAB — COMPLETE METABOLIC PANEL WITH GFR
AG Ratio: 1.6 (calc) (ref 1.0–2.5)
ALT: 5 U/L — ABNORMAL LOW (ref 6–29)
AST: 13 U/L (ref 10–35)
Albumin: 4.2 g/dL (ref 3.6–5.1)
Alkaline phosphatase (APISO): 90 U/L (ref 37–153)
BUN: 10 mg/dL (ref 7–25)
CO2: 29 mmol/L (ref 20–32)
Calcium: 9.5 mg/dL (ref 8.6–10.4)
Chloride: 104 mmol/L (ref 98–110)
Creat: 0.65 mg/dL (ref 0.50–1.05)
GFR, Est African American: 113 mL/min/{1.73_m2} (ref >=60)
GFR, Est Non African American: 98 mL/min/{1.73_m2} (ref >=60)
Globulin: 2.6 g/dL (calc) (ref 1.9–3.7)
Glucose, Bld: 83 mg/dL (ref 65–99)
Potassium: 4.2 mmol/L (ref 3.5–5.3)
Sodium: 140 mmol/L (ref 135–146)
Total Bilirubin: 0.8 mg/dL (ref 0.2–1.2)
Total Protein: 6.8 g/dL (ref 6.1–8.1)

## 2019-12-13 LAB — CBC WITH DIFFERENTIAL/PLATELET
Absolute Monocytes: 873 cells/uL (ref 200–950)
Basophils Absolute: 108 cells/uL (ref 0–200)
Basophils Relative: 1.2 %
Eosinophils Absolute: 279 cells/uL (ref 15–500)
Eosinophils Relative: 3.1 %
HCT: 49.1 % — ABNORMAL HIGH (ref 35.0–45.0)
Hemoglobin: 16.4 g/dL — ABNORMAL HIGH (ref 11.7–15.5)
Lymphs Abs: 2610 cells/uL (ref 850–3900)
MCH: 29 pg (ref 27.0–33.0)
MCHC: 33.4 g/dL (ref 32.0–36.0)
MCV: 86.9 fL (ref 80.0–100.0)
MPV: 9.4 fL (ref 7.5–12.5)
Monocytes Relative: 9.7 %
Neutro Abs: 5130 cells/uL (ref 1500–7800)
Neutrophils Relative %: 57 %
Platelets: 301 10*3/uL (ref 140–400)
RBC: 5.65 10*6/uL — ABNORMAL HIGH (ref 3.80–5.10)
RDW: 12.7 % (ref 11.0–15.0)
Total Lymphocyte: 29 %
WBC: 9 10*3/uL (ref 3.8–10.8)

## 2019-12-13 LAB — LIPID PANEL
Cholesterol: 252 mg/dL — ABNORMAL HIGH (ref <200)
HDL: 83 mg/dL (ref >=50)
LDL Cholesterol (Calc): 150 mg/dL (calc) — ABNORMAL HIGH
Non-HDL Cholesterol (Calc): 169 mg/dL (calc) — ABNORMAL HIGH (ref <130)
Total CHOL/HDL Ratio: 3 (calc) (ref <5.0)
Triglycerides: 86 mg/dL (ref <150)

## 2019-12-13 LAB — THYROID PANEL WITH TSH
Free Thyroxine Index: 2 (ref 1.4–3.8)
T3 Uptake: 31 % (ref 22–35)
T4, Total: 6.3 ug/dL (ref 5.1–11.9)
TSH: 2.45 mIU/L (ref 0.40–4.50)

## 2019-12-13 LAB — URINALYSIS, MICROSCOPIC ONLY
Bacteria, UA: NONE SEEN /HPF
Hyaline Cast: NONE SEEN /LPF
Squamous Epithelial / HPF: NONE SEEN /HPF (ref <=5)
WBC, UA: NONE SEEN /HPF (ref 0–5)

## 2020-01-03 ENCOUNTER — Ambulatory Visit (INDEPENDENT_AMBULATORY_CARE_PROVIDER_SITE_OTHER): Payer: BC Managed Care – PPO | Admitting: Family Medicine

## 2020-01-03 ENCOUNTER — Encounter: Payer: Self-pay | Admitting: Emergency Medicine

## 2020-01-03 ENCOUNTER — Emergency Department: Payer: BC Managed Care – PPO

## 2020-01-03 ENCOUNTER — Emergency Department
Admission: EM | Admit: 2020-01-03 | Discharge: 2020-01-03 | Disposition: A | Discharge status: Home / Self Care | Payer: BC Managed Care – PPO | Attending: Emergency Medicine | Admitting: Emergency Medicine

## 2020-01-03 ENCOUNTER — Other Ambulatory Visit: Payer: Self-pay

## 2020-01-03 ENCOUNTER — Encounter: Payer: Self-pay | Admitting: Family Medicine

## 2020-01-03 VITALS — BP 115/68 | HR 84 | Temp 96.9°F | Ht 62.0 in | Wt 107.6 lb

## 2020-01-03 DIAGNOSIS — Z79899 Other long term (current) drug therapy: Secondary | ICD-10-CM | POA: Diagnosis not present

## 2020-01-03 DIAGNOSIS — J449 Chronic obstructive pulmonary disease, unspecified: Secondary | ICD-10-CM | POA: Diagnosis not present

## 2020-01-03 DIAGNOSIS — R8290 Unspecified abnormal findings in urine: Secondary | ICD-10-CM | POA: Diagnosis not present

## 2020-01-03 DIAGNOSIS — R1032 Left lower quadrant pain: Secondary | ICD-10-CM | POA: Insufficient documentation

## 2020-01-03 DIAGNOSIS — R319 Hematuria, unspecified: Secondary | ICD-10-CM | POA: Diagnosis not present

## 2020-01-03 DIAGNOSIS — F17210 Nicotine dependence, cigarettes, uncomplicated: Secondary | ICD-10-CM | POA: Insufficient documentation

## 2020-01-03 DIAGNOSIS — N3281 Overactive bladder: Secondary | ICD-10-CM

## 2020-01-03 DIAGNOSIS — R1012 Left upper quadrant pain: Secondary | ICD-10-CM | POA: Diagnosis not present

## 2020-01-03 DIAGNOSIS — R3915 Urgency of urination: Secondary | ICD-10-CM

## 2020-01-03 DIAGNOSIS — R109 Unspecified abdominal pain: Secondary | ICD-10-CM | POA: Insufficient documentation

## 2020-01-03 LAB — URINALYSIS, COMPLETE (UACMP) WITH MICROSCOPIC
Bacteria, UA: NONE SEEN
Bilirubin Urine: NEGATIVE
Glucose, UA: NEGATIVE mg/dL
Ketones, ur: NEGATIVE mg/dL
Leukocytes,Ua: NEGATIVE
Nitrite: NEGATIVE
Protein, ur: NEGATIVE mg/dL
Specific Gravity, Urine: 1.002 — ABNORMAL LOW (ref 1.005–1.030)
Squamous Epithelial / HPF: NONE SEEN (ref 0–5)
pH: 5 (ref 5.0–8.0)

## 2020-01-03 LAB — CBC
HCT: 42.5 % (ref 36.0–46.0)
Hemoglobin: 14.5 g/dL (ref 12.0–15.0)
MCH: 29.1 pg (ref 26.0–34.0)
MCHC: 34.1 g/dL (ref 30.0–36.0)
MCV: 85.3 fL (ref 80.0–100.0)
Platelets: 241 10*3/uL (ref 150–400)
RBC: 4.98 MIL/uL (ref 3.87–5.11)
RDW: 13 % (ref 11.5–15.5)
WBC: 7.7 10*3/uL (ref 4.0–10.5)
nRBC: 0 % (ref 0.0–0.2)

## 2020-01-03 LAB — BASIC METABOLIC PANEL WITH GFR
Anion gap: 8 (ref 5–15)
BUN: 8 mg/dL (ref 6–20)
CO2: 29 mmol/L (ref 22–32)
Calcium: 9.6 mg/dL (ref 8.9–10.3)
Chloride: 104 mmol/L (ref 98–111)
Creatinine, Ser: 0.61 mg/dL (ref 0.44–1.00)
GFR calc Af Amer: >60 mL/min
GFR calc non Af Amer: >60 mL/min
Glucose, Bld: 130 mg/dL — ABNORMAL HIGH (ref 70–99)
Potassium: 3.9 mmol/L (ref 3.5–5.1)
Sodium: 141 mmol/L (ref 135–145)

## 2020-01-03 LAB — POCT URINALYSIS DIPSTICK
Bilirubin, UA: NEGATIVE
Glucose, UA: NEGATIVE
Ketones, UA: NEGATIVE
Leukocytes, UA: NEGATIVE
Nitrite, UA: NEGATIVE
Protein, UA: NEGATIVE
Spec Grav, UA: <=1.005 — AB
Urobilinogen, UA: 0.2 U/dL
pH, UA: 5

## 2020-01-03 MED ORDER — OXYBUTYNIN CHLORIDE ER 10 MG PO TB24: 10.0000 mg | ORAL_TABLET | Freq: Every day | ORAL | 1 refills | Status: DC

## 2020-01-03 MED ORDER — HYDROCODONE-ACETAMINOPHEN 5-325 MG PO TABS: 1.0000 | ORAL_TABLET | ORAL | 0 refills | Status: DC | PRN

## 2020-01-03 NOTE — ED Provider Notes (Signed)
[facility] Emergency Department Provider Note  ____________________________________________   First MD Initiated Contact with Patient 01/03/20 1240     (approximate)  I have reviewed the triage vital signs and the nursing notes.   HISTORY  Chief Complaint Flank Pain   HPI Molly Banks is a 59 y.o. female presents to the ED with complaint of left sided abdominal pain and flank pain that started last evening.  Patient states she saw her PCP today and a urine dipstick showed that she had blood in her urine.  Her PCP sent her to the ED to rule out kidney stones.  Patient denies any nausea or vomiting.  She is not actually seeing gross hematuria.  She denies any prior kidney stones.  She denies fever, chills, nausea or vomiting.  Currently she rates her pain as a 3 out of 10.  Patient also gives a history of infrequent intermittent episodes in which she has some nausea and vomiting with abdominal pain that clears after approximately 30 to 60 minutes.  She is unaware of any relationship to food.  She states she has had a colonoscopy and endoscopy to evaluate this.  She denies any recent episode of the symptoms.       Past Medical History:  Diagnosis Date  . Arthritis   . Breast lump in female 2005  . COPD (chronic obstructive pulmonary disease) ([place])   . Emphysema lung ([place])   . Erythrocytosis   . Frequent headaches    tension related  . GERD (gastroesophageal reflux disease)    h/o  . Hypoglycemia   . Hypoglycemia   . Leukocytosis   . Thoracic scoliosis    Left concavity  . Thyroid nodule    right side    Patient Active Problem List   Diagnosis Date Noted  . Abdominal pain 01/03/2020  . Overactive bladder 01/03/2020  . Routine medical exam 12/12/2019  . Primary insomnia 04/05/2018  . Urge incontinence 05/01/2017  . BRCA negative 05/01/2017  . Screening for malignant neoplasm of respiratory organ 02/04/2016  . B12 deficiency 02/04/2016  .  Cervical spondylosis with radiculopathy 05/08/2015  . Secondary erythrocytosis 03/02/2015  . Bilateral carpal tunnel syndrome 07/29/2014  . Chronic obstructive pulmonary disease ([place]) 07/29/2014  . History of cardiac cath 07/01/2014  . Hyperlipidemia, mixed 06/13/2014  . Abnormal cardiovascular stress test 06/13/2014  . Tobacco use disorder, continuous 06/04/2014  . H/O endoscopy 12/27/2013    Past Surgical History:  Procedure Laterality Date  . ABDOMINAL HYSTERECTOMY     oophrectomy NOT perfomed  . BREAST BIOPSY Left    [place]  . CHOLECYSTECTOMY    . SHOULDER ARTHROSCOPY WITH BICEPSTENOTOMY Right 07/31/2017   Procedure: SHOULDER ARTHROSCOPY WITH BICEPSTENOTOMY;  Surgeon: Lovell Sheehan, MD;  Location: [facility] ORS;  Service: Orthopedics;  Laterality: Right;  . SHOULDER ARTHROSCOPY WITH SUBACROMIAL DECOMPRESSION Right 07/31/2017   Procedure: SHOULDER ARTHROSCOPY WITH SUBACROMIAL DECOMPRESSION;  Surgeon: Lovell Sheehan, MD;  Location: [facility] ORS;  Service: Orthopedics;  Laterality: Right;  Limited debridement  . TOE SURGERY Left    4th toe  . TONSILLECTOMY  1966    Prior to Admission medications   Medication Sig Start Date End Date Taking? Authorizing Provider  HYDROcodone-acetaminophen (NORCO/VICODIN) 5-325 MG tablet Take 1 tablet by mouth every 4 (four) hours as needed for moderate pain. 01/03/20   Johnn Hai, PA-C  ibuprofen (ADVIL) 800 MG tablet Take 800 mg by mouth every 8 (eight) hours as needed.  [provider]  oxybutynin (DITROPAN XL) 10 MG 24 hr tablet Take 1 tablet (10 mg total) by mouth at bedtime. 01/03/20   Malfi, Lupita Raider, FNP    Allergies Guaifenesin, [place]-df [hydrocodone-guaifenesin], and Mobic [meloxicam]  Family History  Problem Relation Age of Onset  . Brain cancer Mother   . Cancer Mother        brain  . Hiatal hernia Mother   . Lung cancer Father   . COPD Father   . Cancer - Lung Father   . Lymphoma Sister   . Breast cancer Sister  71       BRCA positive test  . Breast cancer Daughter 29  . Breast cancer Maternal Grandmother   . Lung cancer Maternal Grandfather   . Ovarian cancer Neg Hx   . Colon cancer Neg Hx   . Stroke Neg Hx   . Heart disease Neg Hx     Social History Social History   Tobacco Use  . Smoking status: Current Every Day Smoker    Packs/day: 0.50    Years: 30.00    Pack years: 15.00    Types: Cigarettes  . Smokeless tobacco: Former Systems developer  . Tobacco comment: 14, 20s  Substance Use Topics  . Alcohol use: No  . Drug use: No    Review of Systems Constitutional: No fever/chills Cardiovascular: Denies chest pain. Respiratory: Denies shortness of breath. Gastrointestinal: Intermittent left sided abdominal pain no nausea, no vomiting.  No diarrhea.  No constipation. Genitourinary: Negative for dysuria.  Hematuria per PCP. Musculoskeletal: Negative for back pain. Skin: Negative for rash. Neurological: Negative for headaches, focal weakness or numbness. ____________________________________________   PHYSICAL EXAM:  VITAL SIGNS: ED Triage Vitals  Enc Vitals Group     BP 01/03/20 1205 (!) 131/57     Pulse Rate 01/03/20 1205 81     Resp 01/03/20 1205 18     Temp 01/03/20 1205 (!) 97.5 F (36.4 C)     Temp Source 01/03/20 1205 Oral     SpO2 01/03/20 1205 99 %     Weight 01/03/20 1205 107 lb (48.5 kg)     Height 01/03/20 1205 5' 2" (1.575 m)     Head Circumference --      Peak Flow --      Pain Score 01/03/20 1204 3     Pain Loc --      Pain Edu? --      Excl. in [place]? --     Constitutional: Alert and oriented. Well appearing and in no acute distress. Eyes: Conjunctivae are normal. PERRL. EOMI. Head: Atraumatic. Nose: No congestion/rhinnorhea. Mouth/Throat: Mucous membranes are moist.  Oropharynx non-erythematous. Neck: No stridor. Cardiovascular: Normal rate, regular rhythm. Grossly normal heart sounds.  Good peripheral circulation. Respiratory: Normal respiratory effort.  No  retractions. Lungs bilateral coarse sounds without rales or rhonchi.  Patient admits she is a smoker. Gastrointestinal: Soft and nontender. No distention. No CVA tenderness.  Bowel sounds normoactive x4 quadrants. Musculoskeletal: Moves upper and lower extremities with any difficulty.  Normal gait was noted. Neurologic:  Normal speech and language. No gross focal neurologic deficits are appreciated. No gait instability. Skin:  Skin is warm, dry and intact. No rash noted. Psychiatric: Mood and affect are normal. Speech and behavior are normal.  ____________________________________________   LABS (all labs ordered are listed, but only abnormal results are displayed)  Labs Reviewed  URINALYSIS, COMPLETE (UACMP) WITH MICROSCOPIC - Abnormal; Notable for the following components:  Result Value   Color, Urine COLORLESS (*)    APPearance CLEAR (*)    Specific Gravity, Urine 1.002 (*)    Hgb urine dipstick SMALL (*)    All other components within normal limits  BASIC METABOLIC PANEL - Abnormal; Notable for the following components:   Glucose, Bld 130 (*)    All other components within normal limits  CBC    RADIOLOGY  Official radiology report(s): CT Renal Stone Study  Result Date: 01/03/2020 CLINICAL DATA:  Left lower quadrant pain EXAM: CT ABDOMEN AND PELVIS WITHOUT CONTRAST TECHNIQUE: Multidetector CT imaging of the abdomen and pelvis was performed following the standard protocol without IV contrast. COMPARISON:  None. FINDINGS: Lower chest: No acute abnormality. Hepatobiliary: Too small to characterize hypoattenuating lesion of the inferior right hepatic lobe. Cholecystectomy. No unexpected biliary dilatation. Pancreas: Unremarkable. Spleen: Unremarkable. Adrenals/Urinary Tract: Unremarkable. Stomach/Bowel: Stomach is within normal limits. The bowel is normal in caliber. Moderate stool throughout the colon. Vascular/Lymphatic: Aortic atherosclerosis. No enlarged abdominal or pelvic lymph  nodes. Reproductive: Status post hysterectomy. No adnexal masses. Other: No free fluid. Musculoskeletal: No acute osseous abnormality. IMPRESSION: No acute abnormality or findings to account for reported symptoms. Electronically Signed   By: Macy Mis M.D.   On: 01/03/2020 14:01    ____________________________________________   PROCEDURES  Procedure(s) performed (including Critical Care):  Procedures   ____________________________________________   INITIAL IMPRESSION / ASSESSMENT AND PLAN / ED COURSE  As part of my medical decision making, I reviewed the following data within the electronic MEDICAL RECORD NUMBER Notes from prior ED visits and [place] Controlled Substance Database  59 year old female presents to the ED with complaint of left-sided flank pain and reported hematuria at her PCPs office today.  Patient was sent from the office to the ED to rule out kidney stone.  Patient denies any injury to her back.  She states initially she was going to work today and was told to have this checked out.  Urinalysis in the ED showed small hemoglobin urine dipstick but 0-5 RBCs and 0-5 WBCs with no bacteria.  Lab work was unremarkable.  CT scan was negative for stones.  Patient was made aware.  In the event that this is more muscle skeletal patient was given a prescription for Norco 1 every 6 hours as needed for pain.  She is to follow-up with her PCP if this continues and also for referral to gastroenterology to continue work-up for her intermittent GI symptoms.  ____________________________________________   FINAL CLINICAL IMPRESSION(S) / ED DIAGNOSES  Final diagnoses:  Left flank pain     ED Discharge Orders         Ordered    HYDROcodone-acetaminophen (NORCO/VICODIN) 5-325 MG tablet  Every 4 hours PRN     01/03/20 1422           Note:  This document was prepared using Dragon voice recognition software and may include unintentional dictation errors.    Johnn Hai, PA-C  01/03/20 1544    Carrie Mew, MD 01/06/20 2051

## 2020-01-03 NOTE — Assessment & Plan Note (Signed)
Intermittent LUQ abdominal pain that radiates to her back.  Hematuria on urine POCT.  EKG WNL.  Discussed case with collaborating physician and concerns for kidney stones, GI ulcer vs pancreatitis.  In agreement to send patient to ER via Sales executive. Patient in agreement with plan.  Call to [facility] Triage RN, Aundra Millet and provided report

## 2020-01-03 NOTE — Discharge Instructions (Signed)
Follow-up with your primary care provider if any continued problems.  You may also need to be referred to a gastroenterologist if you continue having episodes where you suddenly gets sick.  CT scan and urinalysis is negative for a kidney stone.  Continue drink lots of fluids.  Return to the emergency department if any severe worsening of your symptoms or fever, nausea or vomiting.

## 2020-01-03 NOTE — ED Triage Notes (Signed)
Pt here for left flank pain and left side pain starting last night. Had urine dip at PCP and sent because blood in urine to r/o kidney stones.  No vomiting.  Unlabored. VSS. No fevers.

## 2020-01-03 NOTE — Assessment & Plan Note (Signed)
Was previously on Ditropan XL 10mg  at bedtime and reports good response to medication, requesting to restart.  Has found that since stopping the medication, she has had increased urinary incontinence due to urge and has been needing to wear incontinence underwear to help with her leakage.  Plan: 1. Urine POCT no s/s of infection 2. Will restart Ditropan XL 10mg  nightly.

## 2020-01-03 NOTE — Progress Notes (Signed)
Subjective:    Patient ID: Molly Banks, female    DOB: Oct 21, 1960, 59 y.o.   MRN: [id number]  Molly Banks is a 59 y.o. female presenting on 01/03/2020 for Bloated (intermittent sharp abdominal pain w/ frequent bm, but denies diarrhea. She state that the abdominal some times radiates to her back. x 1 day. Pt reports she had chill beans yesterday .) and Urinary Incontinence (chronic w/ urinary urgency)   HPI  Molly Banks presents to clinic with concerns of bloating and intermittent sharp left upper quadrant abdominal pain with frequent bowel movements.  States pain radiates to her back x 1 day.  Denies dysuria, urinary frequency, urgency, hesitancy, concerns of incomplete emptying or hematuria.  Denies fevers, n/v.  Reports history of overactive bladder, was previously on medication, but had stopped a while back.  Reports was tolerating medications well and would like to restart on them.  Depression screen [facility] 2/9 01/16/2019 07/04/2018 04/05/2018  Decreased Interest 0 0 0  Down, Depressed, Hopeless 0 1 1  PHQ - 2 Score 0 1 1  Altered sleeping - 0 3  Tired, decreased energy - 1 2  Change in appetite - 0 1  Feeling bad or failure about yourself  - 0 0  Trouble concentrating - 0 0  Moving slowly or fidgety/restless - 0 0  Suicidal thoughts - 0 0  PHQ-9 Score - 2 7  Difficult doing work/chores - Not difficult at all Not difficult at all    Social History   Tobacco Use  . Smoking status: Current Every Day Smoker    Packs/day: 0.50    Years: 30.00    Pack years: 15.00    Types: Cigarettes  . Smokeless tobacco: Former Systems developer  . Tobacco comment: 14, 20s  Substance Use Topics  . Alcohol use: No  . Drug use: No    Review of Systems  Constitutional: Negative.   HENT: Negative.   Eyes: Negative.   Respiratory: Negative.   Cardiovascular: Negative.   Gastrointestinal: Positive for abdominal distention and abdominal pain. Negative for anal bleeding, blood in stool, constipation, diarrhea,  nausea, rectal pain and vomiting.  Endocrine: Negative.   Genitourinary: Positive for urgency. Negative for decreased urine volume, difficulty urinating, dyspareunia, dysuria, enuresis, flank pain, frequency, genital sores, hematuria, menstrual problem, pelvic pain, vaginal bleeding, vaginal discharge and vaginal pain.  Musculoskeletal: Negative.   Skin: Negative.   Allergic/Immunologic: Negative.   Neurological: Negative.   Hematological: Negative.   Psychiatric/Behavioral: Negative.    Per HPI unless specifically indicated above     Objective:    BP 115/68   Pulse 84   Temp (!) 96.9 F (36.1 C) (Temporal)   Ht 5\' 2"  (1.575 m)   Wt 107 lb 9.6 oz (48.8 kg)   BMI 19.68 kg/m   Wt Readings from Last 3 Encounters:  01/03/20 107 lb (48.5 kg)  01/03/20 107 lb 9.6 oz (48.8 kg)  12/12/19 107 lb (48.5 kg)    Physical Exam Vitals reviewed.  Constitutional:      General: She is not in acute distress.    Appearance: Normal appearance. She is well-developed and well-groomed. She is obese. She is not ill-appearing or toxic-appearing.  HENT:     Head: Normocephalic.  Eyes:     General: Lids are normal. Vision grossly intact.        Right eye: No discharge.        Left eye: No discharge.     Extraocular Movements: Extraocular  movements intact.     Conjunctiva/sclera: Conjunctivae normal.     Pupils: Pupils are equal, round, and reactive to light.  Cardiovascular:     Rate and Rhythm: Normal rate and regular rhythm.     Pulses: Normal pulses.     Heart sounds: Normal heart sounds. No murmur. No friction rub. No gallop.   Pulmonary:     Effort: Pulmonary effort is normal. No respiratory distress.     Breath sounds: Normal breath sounds.  Abdominal:     General: Abdomen is flat. Bowel sounds are normal. There is no distension.     Palpations: Abdomen is soft. There is no mass.     Tenderness: There is abdominal tenderness in the left upper quadrant. There is no right CVA tenderness,  left CVA tenderness, guarding or rebound.     Hernia: No hernia is present.  Musculoskeletal:     Right lower leg: No edema.     Left lower leg: No edema.  Skin:    General: Skin is warm and dry.     Capillary Refill: Capillary refill takes less than 2 seconds.  Neurological:     General: No focal deficit present.     Mental Status: She is alert and oriented to person, place, and time.     Cranial Nerves: No cranial nerve deficit.     Sensory: No sensory deficit.     Motor: No weakness.     Coordination: Coordination normal.     Gait: Gait normal.  Psychiatric:        Attention and Perception: Attention and perception normal.        Mood and Affect: Mood and affect normal.        Speech: Speech normal.        Behavior: Behavior normal. Behavior is cooperative.        Thought Content: Thought content normal.        Cognition and Memory: Cognition and memory normal.        Judgment: Judgment normal.    Results for orders placed or performed in visit on 01/03/20  POCT Urinalysis Dipstick  Result Value Ref Range   Color, UA light yellow    Clarity, UA clear    Glucose, UA Negative Negative   Bilirubin, UA negative    Ketones, UA negative    Spec Grav, UA <=1.005 (A) 1.010 - 1.025   Blood, UA large    pH, UA 5.0 5.0 - 8.0   Protein, UA Negative Negative   Urobilinogen, UA 0.2 0.2 or 1.0 E.U./dL   Nitrite, UA negative    Leukocytes, UA Negative Negative   Appearance     Odor        Assessment & Plan:   Problem List Items Addressed This Visit      Genitourinary   Overactive bladder    Was previously on Ditropan XL 10mg  at bedtime and reports good response to medication, requesting to restart.  Has found that since stopping the medication, she has had increased urinary incontinence due to urge and has been needing to wear incontinence underwear to help with her leakage.  Plan: 1. Urine POCT no s/s of infection 2. Will restart Ditropan XL 10mg  nightly.      Relevant  Medications   oxybutynin (DITROPAN XL) 10 MG 24 hr tablet     Other   Abdominal pain    Intermittent LUQ abdominal pain that radiates to her back.  Hematuria on urine POCT.  EKG  WNL.  Discussed case with collaborating physician and concerns for kidney stones, GI ulcer vs pancreatitis.  In agreement to send patient to ER via Sales executive. Patient in agreement with plan.  Call to [facility] Triage RN, Aundra Millet and provided report      Relevant Orders   EKG 12-Lead    Other Visit Diagnoses    Urinary urgency    -  Primary   Relevant Orders   POCT Urinalysis Dipstick (Completed)   Abnormal finding on urinalysis       Relevant Orders   Urinalysis, microscopic only   Hematuria, unspecified type          Meds ordered this encounter  Medications  . oxybutynin (DITROPAN XL) 10 MG 24 hr tablet    Sig: Take 1 tablet (10 mg total) by mouth at bedtime.    Dispense:  90 tablet    Refill:  1      Follow up plan: Return if symptoms worsen or fail to improve.   Charlaine Dalton, FNP Family Nurse Practitioner [facility] 01/03/2020, 1:24 PM

## 2020-01-03 NOTE — Patient Instructions (Signed)
As we discussed, proceed directly to the emergency room at [facility] for further evaluation with imaging.  We will follow up with you after discharge.

## 2020-01-03 NOTE — ED Notes (Signed)
See triage note  Presents with left sided abd pain and flank pain   States her PCP sent her here to r/o kidney stones  No fever or n/v

## 2020-01-04 LAB — URINALYSIS, MICROSCOPIC ONLY
Bacteria, UA: NONE SEEN /HPF
Hyaline Cast: NONE SEEN /LPF
RBC / HPF: NONE SEEN /HPF (ref 0–2)
Squamous Epithelial / HPF: NONE SEEN /HPF (ref <=5)
WBC, UA: NONE SEEN /HPF (ref 0–5)

## 2020-01-06 ENCOUNTER — Telehealth: Payer: Self-pay

## 2020-01-06 NOTE — Telephone Encounter (Signed)
Copied from CRM [id number]. Topic: General - Call Back - No Documentation >> Jan 03, 2020  2:39 PM Randol Kern wrote: Reason for CRM: Pt wants clinic to call her back with fu instructions from today's visit. Please advise   Best contact: [phone]   I spoke with the patient and she informed me that she is feeling much better since her previous visit.

## 2020-06-12 ENCOUNTER — Telehealth: Payer: Self-pay

## 2020-06-12 NOTE — Telephone Encounter (Signed)
Copied from CRM [id number]. Topic: General - Other >> Jun 12, 2020  4:21 PM Jaquita Rector A wrote: Reason for CRM: Patient called in and scheduled her Physical only day off is Thursdays will be in Thursday afternoon but need to know if Joni Reining can please order her labs and she will come in fasted at 8 AM to get that done and be back for the appointment in the afternoon. Please call Ph# [phone]

## 2020-06-18 ENCOUNTER — Other Ambulatory Visit: Payer: Self-pay

## 2020-06-18 ENCOUNTER — Encounter: Payer: Self-pay | Admitting: Family Medicine

## 2020-06-18 ENCOUNTER — Ambulatory Visit (INDEPENDENT_AMBULATORY_CARE_PROVIDER_SITE_OTHER): Payer: BC Managed Care – PPO | Admitting: Family Medicine

## 2020-06-18 VITALS — BP 107/66 | HR 79 | Temp 98.2°F | Resp 18 | Ht 62.0 in | Wt 104.8 lb

## 2020-06-18 DIAGNOSIS — N3281 Overactive bladder: Secondary | ICD-10-CM

## 2020-06-18 DIAGNOSIS — Z0000 Encounter for general adult medical examination without abnormal findings: Secondary | ICD-10-CM | POA: Insufficient documentation

## 2020-06-18 DIAGNOSIS — R319 Hematuria, unspecified: Secondary | ICD-10-CM | POA: Diagnosis not present

## 2020-06-18 DIAGNOSIS — G2581 Restless legs syndrome: Secondary | ICD-10-CM | POA: Diagnosis not present

## 2020-06-18 LAB — POCT URINALYSIS DIPSTICK
Bilirubin, UA: NEGATIVE
Glucose, UA: NEGATIVE
Ketones, UA: NEGATIVE
Leukocytes, UA: NEGATIVE
Nitrite, UA: NEGATIVE
Protein, UA: NEGATIVE
Spec Grav, UA: <=1.005 — AB (ref 1.010–1.025)
Urobilinogen, UA: 0.2 E.U./dL
pH, UA: 5 (ref 5.0–8.0)

## 2020-06-18 MED ORDER — OXYBUTYNIN CHLORIDE ER 10 MG PO TB24: 10.0000 mg | ORAL_TABLET | Freq: Two times a day (BID) | ORAL | 1 refills | Status: DC

## 2020-06-18 MED ORDER — ROPINIROLE HCL 0.5 MG PO TABS: 0.5000 mg | ORAL_TABLET | Freq: Every day | ORAL | 1 refills | Status: DC

## 2020-06-18 NOTE — Assessment & Plan Note (Signed)
Has been on Ditropan XL 10mg  and finds it has helped her symptoms but believes may benefit from an increase in dosage.  Will increase to BID instead of once daily and then reassess in 4-8 weeks.

## 2020-06-18 NOTE — Patient Instructions (Addendum)
We will contact you when we receive your lab work results.  I have sent in a prescription for Requip to take 1-2 tablet at bedtime to help with restless leg syndrome.  Increase your water intake  I have sent in an increase on your Ditropan XL from 10mg  ONCE per day to TWICE per day.  A referral to Urology has been placed today.  If you have not heard from the specialty office or our referral coordinator within 1 week, please let know and we will follow up with the referral coordinator for an update.  Well Visit: Care Instructions Overview  Well visits can help you stay healthy. Your provider has checked your overall health and may have suggested ways to take good care of yourself. Your provider also may have recommended tests. At home, you can help prevent illness with healthy eating, regular exercise, and other steps.  Follow-up care is a key part of your treatment and safety. Be sure to make and go to all appointments, and call your provider if you are having problems. It's also a good idea to know your test results and keep a list of the medicines you take.  How can you care for yourself at home?   Get screening tests that you and your doctor decide on. Screening helps find diseases before any symptoms appear.   Eat healthy foods. Choose fruits, vegetables, whole grains, protein, and low-fat dairy foods. Limit fat, especially saturated fat. Reduce salt in your diet.   Limit alcohol. If you are a man, have no more than 2 drinks a day or 14 drinks a week. If you are a woman, have no more than 1 drink a day or 7 drinks a week.   Get at least 30 minutes of physical activity on most days of the week.  We recommend you go no more than 2 days in a row without exercise. Walking is a good choice. You also may want to do other activities, such as running, swimming, cycling, or playing tennis or team sports. Discuss any changes in your exercise program with your provider.   Reach and stay  at a healthy weight. This will lower your risk for many problems, such as obesity, diabetes, heart disease, and high blood pressure.   Do not smoke or allow others to smoke around you. If you need help quitting, talk to your provider about stop-smoking programs and medicines. These can increase your chances of quitting for good.  Can call 1-800-QUIT-NOW (2024239679) for the [facility], assistance with smoking cessation.   Care for your mental health. It is easy to get weighed down by worry and stress. Learn strategies to manage stress, like deep breathing and mindfulness, and stay connected with your family and community. If you find you often feel sad or hopeless, talk with your provider. Treatment can help.   Talk to your provider about whether you have any risk factors for sexually transmitted infections (STIs). You can help prevent STIs if you wait to have sex with a new partner (or partners) until you've each been tested for STIs. It also helps if you use condoms (female or female condoms) and if you limit your sex partners to one person who only has sex with you. Vaccines are available for some STIs, such as HPV (these are age dependent).   If you think you may have a problem with alcohol or drug use, talk to your provider. This includes prescription medicines (such as amphetamines and opioids)  and illegal drugs (such as cocaine and methamphetamine). Your provider can help you figure out what type of treatment is best for you.   If you have concerns about domestic violence or intimate partner violence, there are resources available to you. National Domestic Abuse Hotline 832-749-3846   Protect your skin from too much sun. When you're outdoors from 10 a.m. to 4 p.m., stay in the shade or cover up with clothing and a hat with a wide brim. Wear sunglasses that block UV rays. Even when it's cloudy, put broad-spectrum sunscreen (SPF 30 or higher) on any exposed skin.   See a  dentist one or two times a year for checkups and to have your teeth cleaned.   See an eye doctor once per year for an eye exam.   Wear a seat belt in the car.  When should you call for help?  Watch closely for changes in your health, and be sure to contact your provider if you have any problems or symptoms that concern you.  We will plan to see you back in 3 months for follow up on overactive bladder and restless leg syndrome  You will receive a survey after today's visit either digitally by e-mail or paper by [employer]. Your experiences and feedback matter to [place].  Please respond so we know how we are doing as we provide care for you.  Call us with any questions/concerns/needs.  It is my goal to be available to you for your health concerns.  Thanks for choosing me to be a partner in your healthcare needs!  Charlaine Dalton, FNP-C Family Nurse Practitioner [facility] Phone: [phone]

## 2020-06-18 NOTE — Assessment & Plan Note (Signed)
Restless leg symptoms at night and sometimes awakening at night.  Will start on Requip 0.5mg  at bedtime.  Discussed can increase to 1.0mg  at bedtime, if needed.  Will reassess in 4-8 weeks for evaluation of treatment.

## 2020-06-18 NOTE — Assessment & Plan Note (Signed)
Continued hematuria.  Reviewed with patient and her history of smoking, will refer to urology.  Urology referral from 12/2019 not completed.  Discussed importance of following with Urology for evaluation.  Patient verbalized understanding.

## 2020-06-18 NOTE — Assessment & Plan Note (Signed)
Annual physical exam without new findings.  Well adult with no acute concerns.  Plan: 1. Obtain health maintenance screenings as above according to age. - Increase physical activity to 30 minutes most days of the week.  - Eat healthy diet high in vegetables and fruits; low in refined carbohydrates. - Screening labs and tests as ordered 2. Return 1 year for annual physical.  

## 2020-06-18 NOTE — Progress Notes (Signed)
Subjective:    Patient ID: Molly Banks, female    DOB: Nov 13, 1960, 59 y.o.   MRN: [id number]  Molly Banks is a 59 y.o. female presenting on 06/18/2020 for Annual Exam   HPI  HEALTH MAINTENANCE:  Weight/BMI: Normal, BMI 19.17% Physical activity: Stays active Diet: Regular Seatbelt: Always Sunscreen: Does not wear Mammogram: Due, encouraged to schedule DEXA: Completed 04/18/2018 Colon cancer screening: Completed 12/27/2013, due 12/28/2023 HIV & Hep C Screening: Offered and declined  GC/CT: Offered and declined  Optometry: Yearly  Dentistry: Every 6 months   IMMUNIZATIONS: Tetanus: Discussed Influenza: Discussed COVID: Up to date  Depression screen [facility] 2/9 01/16/2019 07/04/2018 04/05/2018  Decreased Interest 0 0 0  Down, Depressed, Hopeless 0 1 1  PHQ - 2 Score 0 1 1  Altered sleeping - 0 3  Tired, decreased energy - 1 2  Change in appetite - 0 1  Feeling bad or failure about yourself  - 0 0  Trouble concentrating - 0 0  Moving slowly or fidgety/restless - 0 0  Suicidal thoughts - 0 0  PHQ-9 Score - 2 7  Difficult doing work/chores - Not difficult at all Not difficult at all    Past Medical History:  Diagnosis Date  . Arthritis   . Breast lump in female 2005  . COPD (chronic obstructive pulmonary disease) ([place])   . Emphysema lung ([place])   . Erythrocytosis   . Frequent headaches    tension related  . GERD (gastroesophageal reflux disease)    h/o  . Hypoglycemia   . Hypoglycemia   . Leukocytosis   . Thoracic scoliosis    Left concavity  . Thyroid nodule    right side   Past Surgical History:  Procedure Laterality Date  . ABDOMINAL HYSTERECTOMY     oophrectomy NOT perfomed  . BREAST BIOPSY Left    [place]  . CHOLECYSTECTOMY    . SHOULDER ARTHROSCOPY WITH BICEPSTENOTOMY Right 07/31/2017   Procedure: SHOULDER ARTHROSCOPY WITH BICEPSTENOTOMY;  Surgeon: Lovell Sheehan, MD;  Location: [facility] ORS;  Service: Orthopedics;  Laterality: Right;  . SHOULDER  ARTHROSCOPY WITH SUBACROMIAL DECOMPRESSION Right 07/31/2017   Procedure: SHOULDER ARTHROSCOPY WITH SUBACROMIAL DECOMPRESSION;  Surgeon: Lovell Sheehan, MD;  Location: [facility] ORS;  Service: Orthopedics;  Laterality: Right;  Limited debridement  . TOE SURGERY Left    4th toe  . TONSILLECTOMY  1966   Social History   Socioeconomic History  . Marital status: Married    Spouse name: Not on file  . Number of children: Not on file  . Years of education: Not on file  . Highest education level: Not on file  Occupational History  . Not on file  Tobacco Use  . Smoking status: Current Every Day Smoker    Packs/day: 0.50    Years: 30.00    Pack years: 15.00    Types: Cigarettes  . Smokeless tobacco: Former Systems developer  . Tobacco comment: 14, 20s  Vaping Use  . Vaping Use: Every day  . Substances: Nicotine, Flavoring  Substance and Sexual Activity  . Alcohol use: No  . Drug use: No  . Sexual activity: Not on file  Other Topics Concern  . Not on file  Social History Narrative  . Not on file   Social Determinants of Health   Financial Resource Strain:   . Difficulty of Paying Living Expenses: Not on file  Food Insecurity:   . Worried About Charity fundraiser in the Last  Year: Not on file  . Ran Out of Food in the Last Year: Not on file  Transportation Needs:   . Lack of Transportation (Medical): Not on file  . Lack of Transportation (Non-Medical): Not on file  Physical Activity:   . Days of Exercise per Week: Not on file  . Minutes of Exercise per Session: Not on file  Stress:   . Feeling of Stress : Not on file  Social Connections:   . Frequency of Communication with Friends and Family: Not on file  . Frequency of Social Gatherings with Friends and Family: Not on file  . Attends Religious Services: Not on file  . Active Member of Clubs or Organizations: Not on file  . Attends Archivist Meetings: Not on file  . Marital Status: Not on file  Intimate Partner Violence:   .  Fear of Current or Ex-Partner: Not on file  . Emotionally Abused: Not on file  . Physically Abused: Not on file  . Sexually Abused: Not on file   Family History  Problem Relation Age of Onset  . Brain cancer Mother   . Cancer Mother        brain  . Hiatal hernia Mother   . Lung cancer Father   . COPD Father   . Cancer - Lung Father   . Lymphoma Sister   . Breast cancer Sister 46       BRCA positive test  . Breast cancer Daughter 98  . Breast cancer Maternal Grandmother   . Lung cancer Maternal Grandfather   . Ovarian cancer Neg Hx   . Colon cancer Neg Hx   . Stroke Neg Hx   . Heart disease Neg Hx    Current Outpatient Medications on File Prior to Visit  Medication Sig  . HYDROcodone-acetaminophen (NORCO/VICODIN) 5-325 MG tablet Take 1 tablet by mouth every 4 (four) hours as needed for moderate pain.  Marland Kitchen ibuprofen (ADVIL) 800 MG tablet Take 800 mg by mouth every 8 (eight) hours as needed.   No current facility-administered medications on file prior to visit.    Per HPI unless specifically indicated above     Objective:    BP 107/66 (BP Location: Right Arm, Patient Position: Sitting, Cuff Size: Small)   Pulse 79   Temp 98.2 F (36.8 C) (Oral)   Resp 18   Ht 5' 2"  (1.575 m)   Wt 104 lb 12.8 oz (47.5 kg)   SpO2 99%   BMI 19.17 kg/m   Wt Readings from Last 3 Encounters:  06/18/20 104 lb 12.8 oz (47.5 kg)  01/03/20 107 lb (48.5 kg)  01/03/20 107 lb 9.6 oz (48.8 kg)    Physical Exam Vitals and nursing note reviewed.  Constitutional:      General: She is not in acute distress.    Appearance: Normal appearance. She is well-developed, well-groomed and normal weight. She is not ill-appearing or toxic-appearing.  HENT:     Head: Normocephalic and atraumatic.     Right Ear: Tympanic membrane, ear canal and external ear normal. There is no impacted cerumen.     Left Ear: Tympanic membrane, ear canal and external ear normal. There is no impacted cerumen.     Nose: Nose  normal. No congestion or rhinorrhea.     Mouth/Throat:     Lips: Pink.     Mouth: Mucous membranes are moist.     Pharynx: Oropharynx is clear. Uvula midline. No oropharyngeal exudate or posterior oropharyngeal erythema.  Eyes:     General: Lids are normal. Vision grossly intact. No scleral icterus.       Right eye: No discharge.        Left eye: No discharge.     Extraocular Movements: Extraocular movements intact.     Conjunctiva/sclera: Conjunctivae normal.     Pupils: Pupils are equal, round, and reactive to light.  Neck:     Thyroid: No thyroid mass or thyromegaly.  Cardiovascular:     Rate and Rhythm: Normal rate and regular rhythm.     Pulses: Normal pulses.          Dorsalis pedis pulses are 2+ on the right side and 2+ on the left side.     Heart sounds: Normal heart sounds. No murmur heard.  No friction rub. No gallop.   Pulmonary:     Effort: Pulmonary effort is normal. No respiratory distress.     Breath sounds: Normal breath sounds.  Chest:     Comments: Deferred Abdominal:     General: Abdomen is flat. Bowel sounds are normal. There is no distension.     Palpations: Abdomen is soft. There is no hepatomegaly, splenomegaly or mass.     Tenderness: There is no abdominal tenderness. There is no guarding or rebound.     Hernia: No hernia is present.  Genitourinary:    Comments: Deferred Musculoskeletal:        General: Normal range of motion.     Cervical back: Normal range of motion and neck supple. No tenderness.     Right lower leg: No edema.     Left lower leg: No edema.     Comments: Normal tone, strength 5/5 BUE & BLE  Feet:     Right foot:     Skin integrity: Skin integrity normal.     Left foot:     Skin integrity: Skin integrity normal.  Lymphadenopathy:     Cervical: No cervical adenopathy.  Skin:    General: Skin is warm and dry.     Capillary Refill: Capillary refill takes less than 2 seconds.  Neurological:     General: No focal deficit present.       Mental Status: She is alert and oriented to person, place, and time.     Cranial Nerves: No cranial nerve deficit.     Sensory: No sensory deficit.     Motor: No weakness.     Coordination: Coordination normal.     Gait: Gait normal.     Deep Tendon Reflexes: Reflexes normal.  Psychiatric:        Attention and Perception: Attention and perception normal.        Mood and Affect: Mood and affect normal.        Speech: Speech normal.        Behavior: Behavior normal. Behavior is cooperative.        Thought Content: Thought content normal.        Cognition and Memory: Cognition and memory normal.        Judgment: Judgment normal.     Results for orders placed or performed in visit on 06/18/20  POCT Urinalysis Dipstick  Result Value Ref Range   Color, UA light yellow    Clarity, UA clear    Glucose, UA Negative Negative   Bilirubin, UA negative    Ketones, UA negative    Spec Grav, UA <=1.005 (A) 1.010 - 1.025   Blood, UA trace    pH, UA  5.0 5.0 - 8.0   Protein, UA Negative Negative   Urobilinogen, UA 0.2 0.2 or 1.0 E.U./dL   Nitrite, UA negative    Leukocytes, UA Negative Negative   Appearance     Odor        Assessment & Plan:   Problem List Items Addressed This Visit      Genitourinary   Overactive bladder    Has been on Ditropan XL 622m and finds it has helped her symptoms but believes may benefit from an increase in dosage.  Will increase to BID instead of once daily and then reassess in 4-8 weeks.      Relevant Medications   oxybutynin (DITROPAN XL) 10 MG 24 hr tablet   Other Relevant Orders   POCT Urinalysis Dipstick (Completed)     Other   Annual physical exam - Primary    Annual physical exam without new findings.  Well adult with no acute concerns.  Plan: 1. Obtain health maintenance screenings as above according to age. - Increase physical activity to 30 minutes most days of the week.  - Eat healthy diet high in vegetables and fruits; low in refined  carbohydrates. - Screening labs and tests as ordered 2. Return 1 year for annual physical.       Hematuria    Continued hematuria.  Reviewed with patient and her history of smoking, will refer to urology.  Urology referral from 12/2019 not completed.  Discussed importance of following with Urology for evaluation.  Patient verbalized understanding.      Relevant Orders   Ambulatory referral to Urology   Restless leg    Restless leg symptoms at night and sometimes awakening at night.  Will start on Requip 0.52mat bedtime.  Discussed can increase to 1.22m43mt bedtime, if needed.  Will reassess in 4-8 weeks for evaluation of treatment.      Relevant Medications   rOPINIRole (REQUIP) 0.5 MG tablet      Meds ordered this encounter  Medications  . rOPINIRole (REQUIP) 0.5 MG tablet    Sig: Take 1 tablet (0.5 mg total) by mouth at bedtime.    Dispense:  30 tablet    Refill:  1  . oxybutynin (DITROPAN XL) 10 MG 24 hr tablet    Sig: Take 1 tablet (10 mg total) by mouth in the morning and at bedtime.    Dispense:  90 tablet    Refill:  1    Follow up plan: Return in about 3 months (around 09/18/2020) for OAB & RLS F/U.  NicHarlin RainNP-C Family Nurse Practitioner [place] 06/18/2020, 4:28 PM

## 2020-06-24 ENCOUNTER — Telehealth: Payer: Self-pay

## 2020-06-25 NOTE — Telephone Encounter (Signed)
Open in error

## 2020-06-29 ENCOUNTER — Telehealth: Payer: Self-pay

## 2020-06-29 NOTE — Telephone Encounter (Signed)
Copied from CRM [id number]. Topic: General - Inquiry >> Jun 29, 2020 11:55 AM Molly Banks wrote: Pt stated she is unable to see lab results on [facility]. Pt also stated that she can not drink the Ensure due to the taste. Pt requests call back   Called pt told her that her results came back and there is nothing to worry about at this times. Labs was within normal limits.  KP

## 2020-07-02 ENCOUNTER — Encounter: Payer: Self-pay | Admitting: Urology

## 2020-07-02 ENCOUNTER — Other Ambulatory Visit: Payer: Self-pay

## 2020-07-02 ENCOUNTER — Ambulatory Visit (INDEPENDENT_AMBULATORY_CARE_PROVIDER_SITE_OTHER): Payer: BC Managed Care – PPO | Admitting: Urology

## 2020-07-02 VITALS — BP 113/78 | HR 91 | Ht 62.0 in | Wt 104.0 lb

## 2020-07-02 DIAGNOSIS — N3941 Urge incontinence: Secondary | ICD-10-CM | POA: Diagnosis not present

## 2020-07-02 DIAGNOSIS — R319 Hematuria, unspecified: Secondary | ICD-10-CM

## 2020-07-02 DIAGNOSIS — N3281 Overactive bladder: Secondary | ICD-10-CM | POA: Diagnosis not present

## 2020-07-02 NOTE — Progress Notes (Signed)
07/02/2020 10:09 PM   [name] 1960/12/17 [id number]  Referring provider: Verl Bangs, [place],  [place] [address]  Chief Complaint  Patient presents with  . Hematuria    HPI: Molly Banks is a 59 y.o. female seen at the request of Cyndia Skeeters, FNP for evaluation of hematuria.   UAs reviewed since 2017 remarkable for positive blood on dipstick ranging from trace to 2+ however no significant RBCs on microscopy.  Denies gross hematuria  Denies flank, abdominal, pelvic pain  Does complain of urinary frequency, urgency and urge incontinence and has been on extended release oxybutynin 10 mg with minimal improvement in her symptoms  Noncontrast CT abdomen pelvis 12/2019 showed no GU abnormalities or calculi   PMH: Past Medical History:  Diagnosis Date  . Arthritis   . Breast lump in female 2005  . COPD (chronic obstructive pulmonary disease) ([place])   . Emphysema lung ([place])   . Erythrocytosis   . Frequent headaches    tension related  . GERD (gastroesophageal reflux disease)    h/o  . Hypoglycemia   . Hypoglycemia   . Leukocytosis   . Thoracic scoliosis    Left concavity  . Thyroid nodule    right side    Surgical History: Past Surgical History:  Procedure Laterality Date  . ABDOMINAL HYSTERECTOMY     oophrectomy NOT perfomed  . BREAST BIOPSY Left    [place]  . CHOLECYSTECTOMY    . SHOULDER ARTHROSCOPY WITH BICEPSTENOTOMY Right 07/31/2017   Procedure: SHOULDER ARTHROSCOPY WITH BICEPSTENOTOMY;  Surgeon: Lovell Sheehan, MD;  Location: [facility] ORS;  Service: Orthopedics;  Laterality: Right;  . SHOULDER ARTHROSCOPY WITH SUBACROMIAL DECOMPRESSION Right 07/31/2017   Procedure: SHOULDER ARTHROSCOPY WITH SUBACROMIAL DECOMPRESSION;  Surgeon: Lovell Sheehan, MD;  Location: [facility] ORS;  Service: Orthopedics;  Laterality: Right;  Limited debridement  . TOE SURGERY Left    4th toe  . TONSILLECTOMY  1966    Home Medications:  Allergies as of 07/02/2020       Reactions   Guaifenesin Shortness Of Breath, Itching   Tusso-df [hydrocodone-guaifenesin] Shortness Of Breath   Mobic [meloxicam] Other (See Comments)   headache      Medication List       Accurate as of July 02, 2020 10:09 PM. If you have any questions, ask your nurse or doctor.        STOP taking these medications   HYDROcodone-acetaminophen 5-325 MG tablet Commonly known as: NORCO/VICODIN Stopped by: Abbie Sons, MD     TAKE these medications   ibuprofen 800 MG tablet Commonly known as: ADVIL Take 800 mg by mouth every 8 (eight) hours as needed.   oxybutynin 10 MG 24 hr tablet Commonly known as: Ditropan XL Take 1 tablet (10 mg total) by mouth in the morning and at bedtime.   rOPINIRole 0.5 MG tablet Commonly known as: Requip Take 1 tablet (0.5 mg total) by mouth at bedtime.       Allergies:  Allergies  Allergen Reactions  . Guaifenesin Shortness Of Breath and Itching  . Tusso-Df [Hydrocodone-Guaifenesin] Shortness Of Breath  . Mobic [Meloxicam] Other (See Comments)    headache    Family History: Family History  Problem Relation Age of Onset  . Brain cancer Mother   . Cancer Mother        brain  . Hiatal hernia Mother   . Lung cancer Father   . COPD Father   . Cancer -  Lung Father   . Lymphoma Sister   . Breast cancer Sister 93       BRCA positive test  . Breast cancer Daughter 9  . Breast cancer Maternal Grandmother   . Lung cancer Maternal Grandfather   . Ovarian cancer Neg Hx   . Colon cancer Neg Hx   . Stroke Neg Hx   . Heart disease Neg Hx     Social History:  reports that she has been smoking cigarettes. She has a 15.00 pack-year smoking history. She has quit using smokeless tobacco. She reports that she does not drink alcohol and does not use drugs.   Physical Exam: BP 113/78   Pulse 91   Ht 5' 2"  (1.575 m)   Wt 104 lb (47.2 kg)   BMI 19.02 kg/m   Constitutional:  Alert and oriented, No acute distress. HEENT: [place] AT,  moist mucus membranes.  Trachea midline, no masses. Cardiovascular: No clubbing, cyanosis, or edema. Respiratory: Normal respiratory effort, no increased work of breathing. Skin: No rashes, bruises or suspicious lesions. Neurologic: Grossly intact, no focal deficits, moving all 4 extremities. Psychiatric: Normal mood and affect.  Laboratory Data:  Urinalysis Dipstick 1+ blood Microscopy negative  Assessment & Plan:    1.  Dipstick + hematuria  Based on American Urological Association practice guidelines asymptomatic microhematuria [facility]) is defined as three or greater red blood cells per high powered field on a properly collected urinary specimen in the absence of an obvious benign cause. A positive dipstick does not define AMH, and evaluation should be based solely on findings from microscopic examination of urinary sediment and not on a dipstick reading.  2.  Overactive bladder with urge incontinence  Minimal improvement on extended release oxybutynin  Trial Myrbetriq 25 mg-samples given.  She will call back regarding efficacy   Abbie Sons, MD  [facility] [address] [place],  [address] [phone]

## 2020-07-03 LAB — URINALYSIS, COMPLETE
Bilirubin, UA: NEGATIVE
Glucose, UA: NEGATIVE
Ketones, UA: NEGATIVE
Leukocytes,UA: NEGATIVE
Nitrite, UA: NEGATIVE
Protein,UA: NEGATIVE
Specific Gravity, UA: <1.005 — ABNORMAL LOW (ref 1.005–1.030)
Urobilinogen, Ur: 0.2 mg/dL (ref 0.2–1.0)
pH, UA: 5 (ref 5.0–7.5)

## 2020-07-03 LAB — MICROSCOPIC EXAMINATION
Bacteria, UA: NONE SEEN
Epithelial Cells (non renal): NONE SEEN /hpf (ref 0–10)

## 2021-01-06 ENCOUNTER — Emergency Department
Admission: EM | Admit: 2021-01-06 | Discharge: 2021-01-06 | Disposition: A | Discharge status: Home / Self Care | Payer: BC Managed Care – PPO | Attending: Emergency Medicine | Admitting: Emergency Medicine

## 2021-01-06 ENCOUNTER — Other Ambulatory Visit: Payer: Self-pay

## 2021-01-06 ENCOUNTER — Encounter: Payer: Self-pay | Admitting: Emergency Medicine

## 2021-01-06 DIAGNOSIS — J449 Chronic obstructive pulmonary disease, unspecified: Secondary | ICD-10-CM | POA: Diagnosis not present

## 2021-01-06 DIAGNOSIS — K047 Periapical abscess without sinus: Secondary | ICD-10-CM | POA: Diagnosis not present

## 2021-01-06 DIAGNOSIS — F17210 Nicotine dependence, cigarettes, uncomplicated: Secondary | ICD-10-CM | POA: Diagnosis not present

## 2021-01-06 DIAGNOSIS — R21 Rash and other nonspecific skin eruption: Secondary | ICD-10-CM | POA: Insufficient documentation

## 2021-01-06 MED ORDER — DOXYCYCLINE HYCLATE 100 MG PO TABS
100.0000 mg | ORAL_TABLET | Freq: Once | ORAL | Status: AC
  Administered 2021-01-06: 100 mg via ORAL
  Filled 2021-01-06: qty 1

## 2021-01-06 MED ORDER — ACETAMINOPHEN 325 MG PO TABS
650.0000 mg | ORAL_TABLET | Freq: Once | ORAL | Status: AC
  Administered 2021-01-06: 650 mg via ORAL
  Filled 2021-01-06: qty 2

## 2021-01-06 MED ORDER — DOXYCYCLINE HYCLATE 100 MG PO TABS: 100.0000 mg | ORAL_TABLET | Freq: Two times a day (BID) | ORAL | 0 refills | Status: AC

## 2021-01-06 MED ORDER — IBUPROFEN 600 MG PO TABS
600.0000 mg | ORAL_TABLET | Freq: Once | ORAL | Status: AC
  Administered 2021-01-06: 600 mg via ORAL
  Filled 2021-01-06: qty 1

## 2021-01-06 NOTE — ED Notes (Signed)
Patient has tick with her at bedside in a baggie.

## 2021-01-06 NOTE — ED Provider Notes (Signed)
[facility] Emergency Department Provider Note  ____________________________________________  Time seen: Approximately 8:20 PM  I have reviewed the triage vital signs and the nursing notes.   HISTORY  Chief Complaint Rash and Dental Pain    HPI Molly SCHLACHTER is a 60 y.o. female who presents the emergency department complaining of both right lower dental pain and swelling as well as a possible rash from a tick bite.  Patient states that 6 to 8 days ago she pulled a tick off of her abdomen.  She had some localized erythema and edema and that seemed to improve however now she has had a fine rash develop across her abdomen and her back.  No fevers or chills, muscle aches, generalized malaise.  She states that she is concerned that she may have contracted a tickborne illness though she is unsure how long the tick was on her prior to finding it to remove it.  Patient is also complaining of some right lower dental pain.  She states that she broke one of her teeth 2 to 3 weeks ago, has had some edema and pain in that tooth which is in the right lower dentition over the past 2 to 3 days.         Past Medical History:  Diagnosis Date  . Arthritis   . Breast lump in female 2005  . COPD (chronic obstructive pulmonary disease) ([place])   . Emphysema lung ([place])   . Erythrocytosis   . Frequent headaches    tension related  . GERD (gastroesophageal reflux disease)    h/o  . Hypoglycemia   . Hypoglycemia   . Leukocytosis   . Thoracic scoliosis    Left concavity  . Thyroid nodule    right side    Patient Active Problem List   Diagnosis Date Noted  . Annual physical exam 06/18/2020  . Hematuria 06/18/2020  . Restless leg 06/18/2020  . Abdominal pain 01/03/2020  . Overactive bladder 01/03/2020  . Routine medical exam 12/12/2019  . Primary insomnia 04/05/2018  . Urge incontinence 05/01/2017  . BRCA negative 05/01/2017  . Screening for malignant neoplasm of  respiratory organ 02/04/2016  . B12 deficiency 02/04/2016  . Cervical spondylosis with radiculopathy 05/08/2015  . Secondary erythrocytosis 03/02/2015  . Bilateral carpal tunnel syndrome 07/29/2014  . Chronic obstructive pulmonary disease ([place]) 07/29/2014  . History of cardiac cath 07/01/2014  . Hyperlipidemia, mixed 06/13/2014  . Abnormal cardiovascular stress test 06/13/2014  . Tobacco use disorder, continuous 06/04/2014  . H/O endoscopy 12/27/2013    Past Surgical History:  Procedure Laterality Date  . ABDOMINAL HYSTERECTOMY     oophrectomy NOT perfomed  . BREAST BIOPSY Left    [place]  . CHOLECYSTECTOMY    . SHOULDER ARTHROSCOPY WITH BICEPSTENOTOMY Right 07/31/2017   Procedure: SHOULDER ARTHROSCOPY WITH BICEPSTENOTOMY;  Surgeon: Lovell Sheehan, MD;  Location: [facility] ORS;  Service: Orthopedics;  Laterality: Right;  . SHOULDER ARTHROSCOPY WITH SUBACROMIAL DECOMPRESSION Right 07/31/2017   Procedure: SHOULDER ARTHROSCOPY WITH SUBACROMIAL DECOMPRESSION;  Surgeon: Lovell Sheehan, MD;  Location: [facility] ORS;  Service: Orthopedics;  Laterality: Right;  Limited debridement  . TOE SURGERY Left    4th toe  . TONSILLECTOMY  1966    Prior to Admission medications   Medication Sig Start Date End Date Taking? Authorizing Provider  doxycycline (VIBRA-TABS) 100 MG tablet Take 1 tablet (100 mg total) by mouth 2 (two) times daily for 10 days. 01/06/21 01/16/21 Yes Lavance Beazer, Charline Bills, PA-C  ibuprofen (ADVIL) 800 MG tablet Take 800 mg by mouth every 8 (eight) hours as needed.    [provider]  oxybutynin (DITROPAN XL) 10 MG 24 hr tablet Take 1 tablet (10 mg total) by mouth in the morning and at bedtime. 06/18/20   Malfi, Lupita Raider, FNP  rOPINIRole (REQUIP) 0.5 MG tablet Take 1 tablet (0.5 mg total) by mouth at bedtime. 06/18/20   Malfi, Lupita Raider, FNP    Allergies Guaifenesin, Tusso-df [hydrocodone-guaifenesin], and Mobic [meloxicam]  Family History  Problem Relation Age of Onset   . Brain cancer Mother   . Cancer Mother        brain  . Hiatal hernia Mother   . Lung cancer Father   . COPD Father   . Cancer - Lung Father   . Lymphoma Sister   . Breast cancer Sister 61       BRCA positive test  . Breast cancer Daughter 93  . Breast cancer Maternal Grandmother   . Lung cancer Maternal Grandfather   . Ovarian cancer Neg Hx   . Colon cancer Neg Hx   . Stroke Neg Hx   . Heart disease Neg Hx     Social History Social History   Tobacco Use  . Smoking status: Current Every Day Smoker    Packs/day: 0.50    Years: 30.00    Pack years: 15.00    Types: Cigarettes  . Smokeless tobacco: Former Systems developer  . Tobacco comment: 14, 20s  Vaping Use  . Vaping Use: Every day  . Substances: Nicotine, Flavoring  Substance Use Topics  . Alcohol use: No  . Drug use: No     Review of Systems  Constitutional: No fever/chills Eyes: No visual changes. No discharge ENT: Positive for right lower dental pain Cardiovascular: no chest pain. Respiratory: no cough. No SOB. Gastrointestinal: No abdominal pain.  No nausea, no vomiting.  No diarrhea.  No constipation. Musculoskeletal: Negative for musculoskeletal pain. Skin: Positive for a scattered rash following a tick bite Neurological: Negative for headaches, focal weakness or numbness.  10 System ROS otherwise negative.  ____________________________________________   PHYSICAL EXAM:  VITAL SIGNS: ED Triage Vitals  Enc Vitals Group     BP 01/06/21 1818 (!) 127/97     Pulse Rate 01/06/21 1818 98     Resp 01/06/21 1818 18     Temp 01/06/21 1818 98.3 F (36.8 C)     Temp Source 01/06/21 1818 Oral     SpO2 01/06/21 1818 97 %     Weight 01/06/21 1819 102 lb (46.3 kg)     Height 01/06/21 1819 _0  (1.575 m)     Head Circumference --      Peak Flow --      Pain Score 01/06/21 1819 3     Pain Loc --      Pain Edu? --      Excl. in [place]? --      Constitutional: Alert and oriented. Well appearing and in no acute  distress. Eyes: Conjunctivae are normal. PERRL. EOMI. Head: Atraumatic. ENT:      Ears:       Nose: No congestion/rhinnorhea.      Mouth/Throat: Mucous membranes are moist.  Poor dentition throughout.  Multiple caries and erosions and missing dentition noted throughout the lower dentition.  Patient has erythema and edema of the right lower gumline surrounding what appears to be a more fresh break of a tooth.  There is no fluctuance concerning  for drainable abscess.  Some edema is on minimal erythema along the elder jawline.  There is no fluctuance on palpation here.  No extension into the submandibular space or anterior neck. Neck: No stridor.  Neck is supple full range of motion Hematological/Lymphatic/Immunilogical: No cervical lymphadenopathy. Cardiovascular: Normal rate, regular rhythm. Normal S1 and S2.  Good peripheral circulation. Respiratory: Normal respiratory effort without tachypnea or retractions. Lungs CTAB. Good air entry to the bases with no decreased or absent breath sounds. Musculoskeletal: Full range of motion to all extremities. No gross deformities appreciated. Neurologic:  Normal speech and language. No gross focal neurologic deficits are appreciated.  Skin:  Skin is warm, dry and intact.  Scattered maculopapular rash noted to the abdomen and back.  No targetoid type lesion.  No cellulitic patches.  No hives or wheals identified. Psychiatric: Mood and affect are normal. Speech and behavior are normal. Patient exhibits appropriate insight and judgement.   ____________________________________________   LABS (all labs ordered are listed, but only abnormal results are displayed)  Labs Reviewed - No data to display ____________________________________________  EKG   ____________________________________________  RADIOLOGY   No results found.  ____________________________________________    PROCEDURES  Procedure(s) performed:    Procedures    Medications   acetaminophen (TYLENOL) tablet 650 mg (has no administration in time range)  ibuprofen (ADVIL) tablet 600 mg (has no administration in time range)  doxycycline (VIBRA-TABS) tablet 100 mg (has no administration in time range)     ____________________________________________   INITIAL IMPRESSION / ASSESSMENT AND PLAN / ED COURSE  Pertinent labs & imaging results that were available during my care of the patient were reviewed by me and considered in my medical decision making (see chart for details).  Review of the [place] CSRS was performed in accordance of the [place] prior to dispensing any controlled drugs.           Patient's diagnosis is consistent with rash, dental infection.  Patient presented to the emergency department complaining of 2 separate complaints.  Visualization of the right lower dentition reveals evidence of dental infection without drainable abscess.  No indication for labs or imaging at this time based off of physical exam.  Patient also has findings of a scattered rash to the abdomen and back after being bit by a tick.  This does have a the mild appearance of [place] spotted fever type rash.  There is no targetoid lesions to be concerned for Lyme's disease.  At this time I will treat with doxycycline for both the dental issue as well as the possible complication from tick bite..  Follow-up primary care as needed.  Return precautions discussed with the patient.  Patient is given ED precautions to return to the ED for any worsening or new symptoms.     ____________________________________________  FINAL CLINICAL IMPRESSION(S) / ED DIAGNOSES  Final diagnoses:  Dental infection  Rash      NEW MEDICATIONS STARTED DURING THIS VISIT:  ED Discharge Orders         Ordered    doxycycline (VIBRA-TABS) 100 MG tablet  2 times daily        01/06/21 2044              This chart was dictated using voice recognition software/Dragon. Despite best efforts to  proofread, errors can occur which can change the meaning. Any change was purely unintentional.    Darletta Moll, PA-C 01/06/21 2046    Duffy Bruce, MD 01/12/21 2053

## 2021-01-06 NOTE — ED Notes (Signed)
Patient reports removing a tick 3 days ago. Patient reports rash to trunk x2 days, as well as fatigue. Patient also reports broken tooth x 2 weeks ago, and pain to the right lower jaw as a result, x 3 days. Patient reports false teeth to top, and reports needing false teeth on the bottom. 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 Patient reports Ibuprofen around 1pm.

## 2021-01-06 NOTE — ED Triage Notes (Signed)
Pt comes into the ED via POV c/o rash on her stomach from where she pulled a tick off of her on FRiday.  Pt also c/o right lower dental pain with facial swelling.  Pt in NAD at this time with even and unlabored respirations.

## 2021-03-19 ENCOUNTER — Telehealth: Payer: Self-pay

## 2021-03-19 ENCOUNTER — Other Ambulatory Visit: Payer: Self-pay

## 2021-03-19 NOTE — Telephone Encounter (Signed)
The pt was informed that no labs was sent back in October 2021.

## 2021-03-19 NOTE — Telephone Encounter (Signed)
Copied from CRM [id number]. Topic: General - Other >> Mar 19, 2021  3:10 PM Gaetana Michaelis A wrote: Reason for CRM: Patient has called to say that they are unable to view their lab results from 06/18/20 on mychart  The patient would like to be contacted by a member of staff to discuss further when possible

## 2021-05-25 ENCOUNTER — Telehealth: Payer: Self-pay

## 2021-05-25 DIAGNOSIS — E782 Mixed hyperlipidemia: Secondary | ICD-10-CM

## 2021-05-25 DIAGNOSIS — D751 Secondary polycythemia: Secondary | ICD-10-CM

## 2021-05-25 DIAGNOSIS — Z0000 Encounter for general adult medical examination without abnormal findings: Secondary | ICD-10-CM

## 2021-05-25 NOTE — Telephone Encounter (Signed)
The pt is request for you to put her physical labs in for her to get early morning the day of her physical. Her physical is in the afternoon. She want to come in that morning fasting and get her labs and back that afternoon for her physical.

## 2021-05-25 NOTE — Telephone Encounter (Signed)
Copied from CRM [id number]. Topic: Appointment Scheduling - Scheduling Inquiry for Clinic >> May 25, 2021 10:50 AM Molly Banks wrote: Reason for CRM: Pt requests to have labs done on the morning of her appt (06/24/21). Pt requests call back. Cb# [phone]

## 2021-05-26 NOTE — Addendum Note (Signed)
Addended by: Lorre Munroe on: 05/26/2021 07:59 AM   Modules accepted: Orders

## 2021-05-26 NOTE — Telephone Encounter (Signed)
Lab orders placed.  

## 2021-06-17 ENCOUNTER — Encounter: Payer: Self-pay | Admitting: Intensive Care

## 2021-06-17 ENCOUNTER — Telehealth: Payer: Self-pay

## 2021-06-17 ENCOUNTER — Telehealth: Payer: BC Managed Care – PPO | Admitting: Internal Medicine

## 2021-06-17 ENCOUNTER — Ambulatory Visit: Payer: Self-pay

## 2021-06-17 ENCOUNTER — Emergency Department
Admission: EM | Admit: 2021-06-17 | Discharge: 2021-06-17 | Disposition: A | Discharge status: Home / Self Care | Payer: BC Managed Care – PPO | Attending: Emergency Medicine | Admitting: Emergency Medicine

## 2021-06-17 ENCOUNTER — Other Ambulatory Visit: Payer: Self-pay

## 2021-06-17 DIAGNOSIS — R531 Weakness: Secondary | ICD-10-CM | POA: Diagnosis not present

## 2021-06-17 DIAGNOSIS — F17210 Nicotine dependence, cigarettes, uncomplicated: Secondary | ICD-10-CM | POA: Diagnosis not present

## 2021-06-17 DIAGNOSIS — J449 Chronic obstructive pulmonary disease, unspecified: Secondary | ICD-10-CM | POA: Diagnosis not present

## 2021-06-17 DIAGNOSIS — U071 COVID-19: Secondary | ICD-10-CM | POA: Diagnosis not present

## 2021-06-17 LAB — COMPREHENSIVE METABOLIC PANEL
ALT: 9 U/L (ref 0–44)
AST: 25 U/L (ref 15–41)
Albumin: 3.8 g/dL (ref 3.5–5.0)
Alkaline Phosphatase: 75 U/L (ref 38–126)
Anion gap: 8 (ref 5–15)
BUN: 8 mg/dL (ref 6–20)
CO2: 26 mmol/L (ref 22–32)
Calcium: 8.9 mg/dL (ref 8.9–10.3)
Chloride: 100 mmol/L (ref 98–111)
Creatinine, Ser: 0.69 mg/dL (ref 0.44–1.00)
GFR, Estimated: >60 mL/min (ref >60)
Glucose, Bld: 110 mg/dL — ABNORMAL HIGH (ref 70–99)
Potassium: 3.7 mmol/L (ref 3.5–5.1)
Sodium: 134 mmol/L — ABNORMAL LOW (ref 135–145)
Total Bilirubin: 0.6 mg/dL (ref 0.3–1.2)
Total Protein: 6.8 g/dL (ref 6.5–8.1)

## 2021-06-17 LAB — CBC WITH DIFFERENTIAL/PLATELET
Abs Immature Granulocytes: 0.02 10*3/uL (ref 0.00–0.07)
Basophils Absolute: 0.1 10*3/uL (ref 0.0–0.1)
Basophils Relative: 1 %
Eosinophils Absolute: 0 10*3/uL (ref 0.0–0.5)
Eosinophils Relative: 1 %
HCT: 47.1 % — ABNORMAL HIGH (ref 36.0–46.0)
Hemoglobin: 16.2 g/dL — ABNORMAL HIGH (ref 12.0–15.0)
Immature Granulocytes: 0 %
Lymphocytes Relative: 26 %
Lymphs Abs: 1.6 10*3/uL (ref 0.7–4.0)
MCH: 30.1 pg (ref 26.0–34.0)
MCHC: 34.4 g/dL (ref 30.0–36.0)
MCV: 87.4 fL (ref 80.0–100.0)
Monocytes Absolute: 1.7 10*3/uL — ABNORMAL HIGH (ref 0.1–1.0)
Monocytes Relative: 28 %
Neutro Abs: 2.7 10*3/uL (ref 1.7–7.7)
Neutrophils Relative %: 44 %
Platelets: 185 10*3/uL (ref 150–400)
RBC: 5.39 MIL/uL — ABNORMAL HIGH (ref 3.87–5.11)
RDW: 13.1 % (ref 11.5–15.5)
WBC: 6.1 10*3/uL (ref 4.0–10.5)
nRBC: 0 % (ref 0.0–0.2)

## 2021-06-17 LAB — URINALYSIS, ROUTINE W REFLEX MICROSCOPIC
Bacteria, UA: NONE SEEN
Bilirubin Urine: NEGATIVE
Glucose, UA: NEGATIVE mg/dL
Ketones, ur: NEGATIVE mg/dL
Leukocytes,Ua: NEGATIVE
Nitrite: NEGATIVE
Protein, ur: NEGATIVE mg/dL
Specific Gravity, Urine: 1.001 — ABNORMAL LOW (ref 1.005–1.030)
Squamous Epithelial / HPF: NONE SEEN (ref 0–5)
pH: 6 (ref 5.0–8.0)

## 2021-06-17 LAB — RESP PANEL BY RT-PCR (FLU A&B, COVID) ARPGX2
Influenza A by PCR: NEGATIVE
Influenza B by PCR: NEGATIVE
SARS Coronavirus 2 by RT PCR: POSITIVE — AB

## 2021-06-17 LAB — TROPONIN I (HIGH SENSITIVITY): Troponin I (High Sensitivity): <2 ng/L (ref <18)

## 2021-06-17 MED ORDER — NIRMATRELVIR/RITONAVIR (PAXLOVID)TABLET: 3.0000 | ORAL_TABLET | Freq: Two times a day (BID) | ORAL | 0 refills | Status: AC

## 2021-06-17 MED ORDER — ONDANSETRON 4 MG PO TBDP: 4.0000 mg | ORAL_TABLET | Freq: Three times a day (TID) | ORAL | 0 refills | Status: DC | PRN

## 2021-06-17 NOTE — ED Triage Notes (Signed)
Pt states "Cramps started in legs Tuesday night. Yesterday an episode of sweating, nausea and feeling like I was going to pass out happened. I had to leave work I was so weak. I have been aching all over and taking ibuprofen. Another episode happened this AM and the doctor sent me here"

## 2021-06-17 NOTE — ED Provider Notes (Signed)
[facility] Emergency Department Provider Note  Time seen: 6:24 PM  I have reviewed the triage vital signs and the nursing notes.   HISTORY  Chief Complaint Weakness   HPI Molly Banks is a 60 y.o. female with a past medical history of arthritis, COPD, gastric reflux, presents to the emergency department for generalized fatigue weakness and near syncope.  According to the patient over the past 2 days she has had a slight cough, has been feeling fatigued has had an episode of diarrhea has been nauseated at times although no vomiting and twice now is felt like she is going to pass out.  Patient denies any known fever.  Afebrile in the emergency department.  Patient states she has received her COVID vaccinations and 1 booster.   Past Medical History:  Diagnosis Date   Arthritis    Breast lump in female 2005   COPD (chronic obstructive pulmonary disease) ([place])    Emphysema lung ([place])    Erythrocytosis    Frequent headaches    tension related   GERD (gastroesophageal reflux disease)    h/o   Hypoglycemia    Hypoglycemia    Leukocytosis    Thoracic scoliosis    Left concavity   Thyroid nodule    right side    Patient Active Problem List   Diagnosis Date Noted   Annual physical exam 06/18/2020   Hematuria 06/18/2020   Restless leg 06/18/2020   Abdominal pain 01/03/2020   Overactive bladder 01/03/2020   Routine medical exam 12/12/2019   Primary insomnia 04/05/2018   Urge incontinence 05/01/2017   BRCA negative 05/01/2017   Screening for malignant neoplasm of respiratory organ 02/04/2016   B12 deficiency 02/04/2016   Cervical spondylosis with radiculopathy 05/08/2015   Secondary erythrocytosis 03/02/2015   Bilateral carpal tunnel syndrome 07/29/2014   Chronic obstructive pulmonary disease ([place]) 07/29/2014   History of cardiac cath 07/01/2014   Hyperlipidemia, mixed 06/13/2014   Abnormal cardiovascular stress test 06/13/2014   Tobacco use disorder,  continuous 06/04/2014   H/O endoscopy 12/27/2013    Past Surgical History:  Procedure Laterality Date   ABDOMINAL HYSTERECTOMY     oophrectomy NOT perfomed   BREAST BIOPSY Left    [place]   CHOLECYSTECTOMY     SHOULDER ARTHROSCOPY WITH BICEPSTENOTOMY Right 07/31/2017   Procedure: SHOULDER ARTHROSCOPY WITH BICEPSTENOTOMY;  Surgeon: Lovell Sheehan, MD;  Location: [facility] ORS;  Service: Orthopedics;  Laterality: Right;   SHOULDER ARTHROSCOPY WITH SUBACROMIAL DECOMPRESSION Right 07/31/2017   Procedure: SHOULDER ARTHROSCOPY WITH SUBACROMIAL DECOMPRESSION;  Surgeon: Lovell Sheehan, MD;  Location: [facility] ORS;  Service: Orthopedics;  Laterality: Right;  Limited debridement   TOE SURGERY Left    4th toe   TONSILLECTOMY  1966    Prior to Admission medications   Medication Sig Start Date End Date Taking? Authorizing Provider  ibuprofen (ADVIL) 800 MG tablet Take 800 mg by mouth every 8 (eight) hours as needed.    [provider]  oxybutynin (DITROPAN XL) 10 MG 24 hr tablet Take 1 tablet (10 mg total) by mouth in the morning and at bedtime. 06/18/20   Malfi, Lupita Raider, FNP  rOPINIRole (REQUIP) 0.5 MG tablet Take 1 tablet (0.5 mg total) by mouth at bedtime. 06/18/20   Malfi, Lupita Raider, FNP    Allergies  Allergen Reactions   Guaifenesin Shortness Of Breath and Itching   Tusso-Df [Hydrocodone-Guaifenesin] Shortness Of Breath   Mobic [Meloxicam] Other (See Comments)    headache  Family History  Problem Relation Age of Onset   Brain cancer Mother    Cancer Mother        brain   Hiatal hernia Mother    Lung cancer Father    COPD Father    Cancer - Lung Father    Lymphoma Sister    Breast cancer Sister 73       BRCA positive test   Breast cancer Daughter 23   Breast cancer Maternal Grandmother    Lung cancer Maternal Grandfather    Ovarian cancer Neg Hx    Colon cancer Neg Hx    Stroke Neg Hx    Heart disease Neg Hx     Social History Social History   Tobacco Use    Smoking status: Every Day    Packs/day: 0.50    Years: 30.00    Pack years: 15.00    Types: Cigarettes   Smokeless tobacco: Former   Tobacco comments:    14, 20s  Vaping Use   Vaping Use: Every day   Substances: Nicotine, Flavoring  Substance Use Topics   Alcohol use: No   Drug use: No    Review of Systems Constitutional: Negative for fever.  Positive for generalized fatigue/weakness. Cardiovascular: Negative for chest pain. Respiratory: Negative for shortness of breath. Gastrointestinal: Negative for abdominal pain.  Positive for nausea but negative for vomiting.  Positive for diarrhea. Genitourinary: Negative for urinary compaints Musculoskeletal: States generalized body aches. Neurological: Negative for headache All other ROS negative  ____________________________________________   PHYSICAL EXAM:  VITAL SIGNS: ED Triage Vitals  Enc Vitals Group     BP 06/17/21 1452 100/72     Pulse Rate 06/17/21 1452 86     Resp 06/17/21 1452 18     Temp 06/17/21 1452 98.4 F (36.9 C)     Temp Source 06/17/21 1452 Oral     SpO2 06/17/21 1452 95 %     Weight 06/17/21 1452 110 lb (49.9 kg)     Height 06/17/21 1452 5' 2"  (1.575 m)     Head Circumference --      Peak Flow --      Pain Score 06/17/21 1457 0     Pain Loc --      Pain Edu? --      Excl. in [place]? --    Constitutional: Alert and oriented. Well appearing and in no distress. Eyes: Normal exam ENT      Head: Normocephalic and atraumatic.      Mouth/Throat: Mucous membranes are moist. Cardiovascular: Normal rate, regular rhythm Respiratory: Normal respiratory effort without tachypnea nor retractions. Breath sounds are clear Gastrointestinal: Soft and nontender. No distention. Musculoskeletal: Nontender with normal range of motion in all extremities.  Neurologic:  Normal speech and language. No gross focal neurologic deficits  Skin:  Skin is warm, dry and intact.  Psychiatric: Mood and affect are normal.    ____________________________________________    EKG  EKG viewed and interpreted by myself shows a normal sinus rhythm at 79 bpm with a narrow QRS, normal axis, normal intervals, no concerning ST changes.    INITIAL IMPRESSION / ASSESSMENT AND PLAN / ED COURSE  Pertinent labs & imaging results that were available during my care of the patient were reviewed by me and considered in my medical decision making (see chart for details).   Patient presents emergency department for generalized fatigue weakness near syncope, nausea, diarrhea, cough congestion.  Patient symptoms are very suggestive of a viral syndrome.  Lab work is reassuring.  Vital signs are reassuring.  No concerning findings on physical exam, clear lung sounds.  Patient's COVID test has resulted positive.  COVID explains the patient's symptoms very well.  Given the patient's comorbidities we will place on paxlovid, prescribe Zofran.  Discussed supportive care as well as return precautions.  Patient agreeable to plan of care.  Molly Banks was evaluated in Emergency Department on 06/17/2021 for the symptoms described in the history of present illness. She was evaluated in the context of the global COVID-19 pandemic, which necessitated consideration that the patient might be at risk for infection with the SARS-CoV-2 virus that causes COVID-19. Institutional protocols and algorithms that pertain to the evaluation of patients at risk for COVID-19 are in a state of rapid change based on information released by regulatory bodies including the CDC and federal and state organizations. These policies and algorithms were followed during the patient's care in the ED.  ____________________________________________   FINAL CLINICAL IMPRESSION(S) / ED DIAGNOSES  COVID-19   Harvest Dark, MD 06/17/21 1826

## 2021-06-17 NOTE — Telephone Encounter (Signed)
About 10-15 minutes later the patient calls back requesting to be connected back with the office because she missed a call for her virtual visit.  I spoke with the patient and she informed me that they call EMS because she said she felt like she was dying.  She complained of chills, sweating, lightheadedness like she was going to black out, bodyaches, weakness  and legs cramps. The pt was told per Rene Kocher that she need to be actually seen in person and evaluated for her symptoms. No appt available today. Pt recommended to go to the Urgent Care. She verbalize understanding, no questions or concerns.

## 2021-06-17 NOTE — Telephone Encounter (Signed)
I called the patient for her mychart virtual visit to get her triage prior to her visit. I spoke with a gentlemen who told me that he was on the phone with EMS for the patient. He was unable to give me any information, because he had to get back with the 911 operator.

## 2021-06-17 NOTE — Progress Notes (Deleted)
Virtual Visit via Video Note  I connected with Molly Banks on 06/17/21 at 11:20 AM EDT by a video enabled telemedicine application and verified that I am speaking with the correct person using two identifiers.  Location: Patient: Home Provider: Office  Persons participating in this video call: Webb Silversmith, NP and Alferd Apa   I discussed the limitations of evaluation and management by telemedicine and the availability of in person appointments. The patient expressed understanding and agreed to proceed.  History of Present Illness:    Past Medical History:  Diagnosis Date   Arthritis    Breast lump in female 2005   COPD (chronic obstructive pulmonary disease) (HCC)    Emphysema lung (HCC)    Erythrocytosis    Frequent headaches    tension related   GERD (gastroesophageal reflux disease)    h/o   Hypoglycemia    Hypoglycemia    Leukocytosis    Thoracic scoliosis    Left concavity   Thyroid nodule    right side    Current Outpatient Medications  Medication Sig Dispense Refill   ibuprofen (ADVIL) 800 MG tablet Take 800 mg by mouth every 8 (eight) hours as needed.     oxybutynin (DITROPAN XL) 10 MG 24 hr tablet Take 1 tablet (10 mg total) by mouth in the morning and at bedtime. 90 tablet 1   rOPINIRole (REQUIP) 0.5 MG tablet Take 1 tablet (0.5 mg total) by mouth at bedtime. 30 tablet 1   No current facility-administered medications for this visit.    Allergies  Allergen Reactions   Guaifenesin Shortness Of Breath and Itching   Tusso-Df [Hydrocodone-Guaifenesin] Shortness Of Breath   Mobic [Meloxicam] Other (See Comments)    headache    Family History  Problem Relation Age of Onset   Brain cancer Mother    Cancer Mother        brain   Hiatal hernia Mother    Lung cancer Father    COPD Father    Cancer - Lung Father    Lymphoma Sister    Breast cancer Sister 57       BRCA positive test   Breast cancer Daughter 23   Breast cancer Maternal Grandmother    Lung  cancer Maternal Grandfather    Ovarian cancer Neg Hx    Colon cancer Neg Hx    Stroke Neg Hx    Heart disease Neg Hx     Social History   Socioeconomic History   Marital status: Married    Spouse name: Not on file   Number of children: Not on file   Years of education: Not on file   Highest education level: Not on file  Occupational History   Not on file  Tobacco Use   Smoking status: Every Day    Packs/day: 0.50    Years: 30.00    Pack years: 15.00    Types: Cigarettes   Smokeless tobacco: Former   Tobacco comments:    14, 20s  Vaping Use   Vaping Use: Every day   Substances: Nicotine, Flavoring  Substance and Sexual Activity   Alcohol use: No   Drug use: No   Sexual activity: Not on file  Other Topics Concern   Not on file  Social History Narrative   Not on file   Social Determinants of Health   Financial Resource Strain: Not on file  Food Insecurity: Not on file  Transportation Needs: Not on file  Physical Activity: Not on  file  Stress: Not on file  Social Connections: Not on file  Intimate Partner Violence: Not on file     Constitutional: Denies fever, malaise, fatigue, headache or abrupt weight changes.  HEENT: Denies eye pain, eye redness, ear pain, ringing in the ears, wax buildup, runny nose, nasal congestion, bloody nose, or sore throat. Respiratory: Denies difficulty breathing, shortness of breath, cough or sputum production.   Cardiovascular: Denies chest pain, chest tightness, palpitations or swelling in the hands or feet.  Gastrointestinal: Denies abdominal pain, bloating, constipation, diarrhea or blood in the stool.  GU: Denies urgency, frequency, pain with urination, burning sensation, blood in urine, odor or discharge. Musculoskeletal: Denies decrease in range of motion, difficulty with gait, muscle pain or joint pain and swelling.  Skin: Denies redness, rashes, lesions or ulcercations.  Neurological: Denies dizziness, difficulty with memory,  difficulty with speech or problems with balance and coordination.  Psych: Denies anxiety, depression, SI/HI.  No other specific complaints in a complete review of systems (except as listed in HPI above).  Observations/Objective: There were no vitals taken for this visit. Wt Readings from Last 3 Encounters:  01/06/21 102 lb (46.3 kg)  07/02/20 104 lb (47.2 kg)  06/18/20 104 lb 12.8 oz (47.5 kg)    General: Appears their stated age, well developed, well nourished in NAD. Skin: Warm, dry and intact. No rashes, lesions or ulcerations noted. HEENT: Head: normal shape and size; Eyes: sclera white, no icterus, conjunctiva pink, PERRLA and EOMs intact; Ears: Tm's gray and intact, normal light reflex; Nose: mucosa pink and moist, septum midline; Throat/Mouth: Teeth present, mucosa pink and moist, no exudate, lesions or ulcerations noted.  Neck:  Neck supple, trachea midline. No masses, lumps or thyromegaly present.  Cardiovascular: Normal rate and rhythm. S1,S2 noted.  No murmur, rubs or gallops noted. No JVD or BLE edema. No carotid bruits noted. Pulmonary/Chest: Normal effort and positive vesicular breath sounds. No respiratory distress. No wheezes, rales or ronchi noted.  Abdomen: Soft and nontender. Normal bowel sounds. No distention or masses noted. Liver, spleen and kidneys non palpable. Musculoskeletal: Normal range of motion. No signs of joint swelling. No difficulty with gait.  Neurological: Alert and oriented. Cranial nerves II-XII grossly intact. Coordination normal.  Psychiatric: Mood and affect normal. Behavior is normal. Judgment and thought content normal.   EKG:  BMET    Component Value Date/Time   NA 141 01/03/2020 1210   K 3.9 01/03/2020 1210   CL 104 01/03/2020 1210   CO2 29 01/03/2020 1210   GLUCOSE 130 (H) 01/03/2020 1210   BUN 8 01/03/2020 1210   CREATININE 0.61 01/03/2020 1210   CREATININE 0.65 12/12/2019 0840   CALCIUM 9.6 01/03/2020 1210   GFRNONAA >60 01/03/2020  1210   GFRNONAA 98 12/12/2019 0840   GFRAA >60 01/03/2020 1210   GFRAA 113 12/12/2019 0840    Lipid Panel     Component Value Date/Time   CHOL 252 (H) 12/12/2019 0840   TRIG 86 12/12/2019 0840   HDL 83 12/12/2019 0840   CHOLHDL 3.0 12/12/2019 0840   VLDL 12 04/03/2017 0800   LDLCALC 150 (H) 12/12/2019 0840    CBC    Component Value Date/Time   WBC 7.7 01/03/2020 1210   RBC 4.98 01/03/2020 1210   HGB 14.5 01/03/2020 1210   HCT 42.5 01/03/2020 1210   HCT 43.6 12/08/2014 0828   PLT 241 01/03/2020 1210   MCV 85.3 01/03/2020 1210   MCH 29.1 01/03/2020 1210   MCHC 34.1  01/03/2020 1210   RDW 13.0 01/03/2020 1210   LYMPHSABS 2,610 12/12/2019 0840   MONOABS 675 04/03/2017 0800   EOSABS 279 12/12/2019 0840   BASOSABS 108 12/12/2019 0840    Hgb A1C Lab Results  Component Value Date   HGBA1C 5.2 04/03/2017        Assessment and Plan:   Follow Up Instructions:    I discussed the assessment and treatment plan with the patient. The patient was provided an opportunity to ask questions and all were answered. The patient agreed with the plan and demonstrated an understanding of the instructions.   The patient was advised to call back or seek an in-person evaluation if the symptoms worsen or if the condition fails to improve as anticipated.    Webb Silversmith, NP

## 2021-06-17 NOTE — ED Provider Notes (Signed)
Emergency Medicine Provider Triage Evaluation Note  Molly Banks , a 60 y.o. female  was evaluated in triage.  Pt complains of weakness, body aches, sweats, nausea. No fever. Symptoms started Tuesday.  Review of Systems  Positive: Weakness, dizziness, diaphoresis Negative: Vomiting, diarrhea.  Physical Exam  BP 100/72 (BP Location: Left Arm)   Pulse 86   Temp 98.4 F (36.9 C) (Oral)   Resp 18   Ht 5\' 2"  (1.575 m)   Wt 49.9 kg   SpO2 95%   BMI 20.12 kg/m  Gen:   Awake, no distress   Resp:  Normal effort  MSK:   Moves extremities without difficulty  Other:    Medical Decision Making  Medically screening exam initiated at 2:56 PM.  Appropriate orders placed.  Molly Banks was informed that the remainder of the evaluation will be completed by another provider, this initial triage assessment does not replace that evaluation, and the importance of remaining in the ED until their evaluation is complete.   Felton Clinton, FNP 06/17/21 1459    06/19/21, MD 06/17/21 1733

## 2021-06-17 NOTE — Telephone Encounter (Signed)
Pt. Reports she started having symptoms Tuesday - cough, ear pain, sore throat, body aches, sweats."I feel like I have the flu." Has not had flu vaccine. Negative COVID 19 test yesterday. Pt. States she can do video visit. Appointment made for today per Fleet Contras in the practice.     Reason for Disposition  Earache  Answer Assessment - Initial Assessment Questions 1. WORST SYMPTOM: "What is your worst symptom?" (e.g., cough, runny nose, muscle aches, headache, sore throat, fever)      Cough, body aches, sore throat, ear pain 2. ONSET: "When did your flu symptoms start?"      Tuesday 3. COUGH: "How bad is the cough?"       Mild 4. RESPIRATORY DISTRESS: "Describe your breathing."      No 5. FEVER: "Do you have a fever?" If Yes, ask: "What is your temperature, how was it measured, and when did it start?"     No 6. EXPOSURE: "Were you exposed to someone with influenza?"       Unsure 7. FLU VACCINE: "Did you get a flu shot this year?"     No 8. HIGH RISK DISEASE: "Do you have any chronic medical problems?" (e.g., heart or lung disease, asthma, weak immune system, or other HIGH RISK conditions)     No 9. PREGNANCY: "Is there any chance you are pregnant?" "When was your last menstrual period?"     No 10. OTHER SYMPTOMS: "Do you have any other symptoms?"  (e.g., runny nose, muscle aches, headache, sore throat)       Muscle aches, sore throat  Protocols used: Influenza - [facility]

## 2021-06-23 ENCOUNTER — Other Ambulatory Visit: Payer: Self-pay

## 2021-06-23 DIAGNOSIS — E782 Mixed hyperlipidemia: Secondary | ICD-10-CM

## 2021-06-23 DIAGNOSIS — D751 Secondary polycythemia: Secondary | ICD-10-CM

## 2021-06-23 DIAGNOSIS — Z0000 Encounter for general adult medical examination without abnormal findings: Secondary | ICD-10-CM

## 2021-06-24 ENCOUNTER — Other Ambulatory Visit: Payer: Self-pay

## 2021-06-24 ENCOUNTER — Ambulatory Visit (INDEPENDENT_AMBULATORY_CARE_PROVIDER_SITE_OTHER): Payer: BC Managed Care – PPO | Admitting: Internal Medicine

## 2021-06-24 ENCOUNTER — Other Ambulatory Visit: Payer: BC Managed Care – PPO

## 2021-06-24 ENCOUNTER — Encounter: Payer: Self-pay | Admitting: Internal Medicine

## 2021-06-24 VITALS — BP 103/64 | HR 94 | Temp 97.0°F | Resp 17 | Ht 62.0 in | Wt 110.2 lb

## 2021-06-24 DIAGNOSIS — D751 Secondary polycythemia: Secondary | ICD-10-CM

## 2021-06-24 DIAGNOSIS — R0789 Other chest pain: Secondary | ICD-10-CM | POA: Diagnosis not present

## 2021-06-24 DIAGNOSIS — R110 Nausea: Secondary | ICD-10-CM

## 2021-06-24 DIAGNOSIS — E782 Mixed hyperlipidemia: Secondary | ICD-10-CM

## 2021-06-24 DIAGNOSIS — Z0000 Encounter for general adult medical examination without abnormal findings: Secondary | ICD-10-CM | POA: Diagnosis not present

## 2021-06-24 DIAGNOSIS — Z1231 Encounter for screening mammogram for malignant neoplasm of breast: Secondary | ICD-10-CM

## 2021-06-24 DIAGNOSIS — R61 Generalized hyperhidrosis: Secondary | ICD-10-CM

## 2021-06-24 DIAGNOSIS — M8589 Other specified disorders of bone density and structure, multiple sites: Secondary | ICD-10-CM

## 2021-06-24 DIAGNOSIS — N3281 Overactive bladder: Secondary | ICD-10-CM

## 2021-06-24 DIAGNOSIS — R42 Dizziness and giddiness: Secondary | ICD-10-CM | POA: Diagnosis not present

## 2021-06-24 DIAGNOSIS — F5101 Primary insomnia: Secondary | ICD-10-CM

## 2021-06-24 DIAGNOSIS — R519 Headache, unspecified: Secondary | ICD-10-CM

## 2021-06-24 DIAGNOSIS — J410 Simple chronic bronchitis: Secondary | ICD-10-CM

## 2021-06-24 DIAGNOSIS — R7309 Other abnormal glucose: Secondary | ICD-10-CM | POA: Diagnosis not present

## 2021-06-24 DIAGNOSIS — I700 Atherosclerosis of aorta: Secondary | ICD-10-CM

## 2021-06-24 DIAGNOSIS — K219 Gastro-esophageal reflux disease without esophagitis: Secondary | ICD-10-CM

## 2021-06-24 DIAGNOSIS — G2581 Restless legs syndrome: Secondary | ICD-10-CM

## 2021-06-24 NOTE — Assessment & Plan Note (Signed)
No improvement with Ropinirole We will check magnesium

## 2021-06-24 NOTE — Patient Instructions (Signed)

## 2021-06-24 NOTE — Assessment & Plan Note (Signed)
Encourage calcium and vitamin D OTC Encourage daily weightbearing exercise 

## 2021-06-24 NOTE — Assessment & Plan Note (Signed)
Currently not an issue off medications

## 2021-06-24 NOTE — Assessment & Plan Note (Signed)
Continue Oxybutynin 

## 2021-06-24 NOTE — Assessment & Plan Note (Signed)
Not medicated Will monitor 

## 2021-06-24 NOTE — Assessment & Plan Note (Signed)
She will continue daily caffeine to prevent headaches

## 2021-06-24 NOTE — Assessment & Plan Note (Signed)
CBC today Encouraged smoking cessation

## 2021-06-24 NOTE — Assessment & Plan Note (Signed)
Encouraged smoking cessation Getting CT Lung Cancer screening

## 2021-06-24 NOTE — Progress Notes (Signed)
Subjective:    Patient ID: Molly Banks, female    DOB: [birth year], 60 y.o.   MRN: [id number]  HPI  Pt presents to the clinic today for her annual exam. She is also due to follow up chronic conditions.  COPD: She reports chronic cough but denies shortness of breath. She is not using any inhalers at this time. There are no PFT's on file. She does continue to smoke.  Frequent Headaches: These occur rarely. Triggered by lack of caffeine. She takes Ibuprofen as needed with good relief of symptoms.  GERD: She is not sure what triggers. She is not taking any medications OTC for this. There is no upper GI on file.  HLD: Her last LDL was 150, triglycerides 86, 11/2019. She is not taking any cholesterol lowering medication at this time. She tries to consume a low fat diet.  OAB: Mainly urgency and frequency. She is taking Oxybutynin as prescribed. She does not follow with urology.  Insomnia: She has difficulty falling asleep. She is not taking any medication for this. There is no sleep study on file.  RLS: This occurs mainly at night. She has taken Ropinirole but reports it was not effective..   Erythorcytosis: Her last H/H was 16.2/47.1, 05/2021. She does smoke. She does not follow with hematology.  Osteopenia: She is not taking any Calcium or Vit D OTC. She gets weight bearing exercise daily. Bone density from 03/2018 reviewed.  Flu: never Tetanus:< 10 years ago Shingrix: never Pneumovax: never Covid: Jannsen x 1 Pap smear: hysterectomy Mammogram: > 5 years ago Bone density: 03/2018 Colon screening: 12/2013 Vision screening: annually Dentist: as needed  Diet: She does eat meat. She consumes fruits and veggies. She does eat some fried food. She drinks mostly soda and gatorade. Exercise: Walking  Review of Systems     Past Medical History:  Diagnosis Date   Arthritis    Breast lump in female 2005   COPD (chronic obstructive pulmonary disease) (HCC)    Emphysema lung (HCC)     Erythrocytosis    Frequent headaches    tension related   GERD (gastroesophageal reflux disease)    h/o   Hypoglycemia    Hypoglycemia    Leukocytosis    Thoracic scoliosis    Left concavity   Thyroid nodule    right side    Current Outpatient Medications  Medication Sig Dispense Refill   ibuprofen (ADVIL) 800 MG tablet Take 800 mg by mouth every 8 (eight) hours as needed.     ondansetron (ZOFRAN ODT) 4 MG disintegrating tablet Take 1 tablet (4 mg total) by mouth every 8 (eight) hours as needed for nausea or vomiting. 20 tablet 0   oxybutynin (DITROPAN XL) 10 MG 24 hr tablet Take 1 tablet (10 mg total) by mouth in the morning and at bedtime. 90 tablet 1   rOPINIRole (REQUIP) 0.5 MG tablet Take 1 tablet (0.5 mg total) by mouth at bedtime. 30 tablet 1   No current facility-administered medications for this visit.    Allergies  Allergen Reactions   Guaifenesin Shortness Of Breath and Itching   Tusso-Df [Hydrocodone-Guaifenesin] Shortness Of Breath   Mobic [Meloxicam] Other (See Comments)    headache    Family History  Problem Relation Age of Onset   Brain cancer Mother    Cancer Mother        brain   Hiatal hernia Mother    Lung cancer Father    COPD Father    Cancer -  Lung Father    Lymphoma Sister    Breast cancer Sister 73       BRCA positive test   Breast cancer Daughter 86   Breast cancer Maternal Grandmother    Lung cancer Maternal Grandfather    Ovarian cancer Neg Hx    Colon cancer Neg Hx    Stroke Neg Hx    Heart disease Neg Hx     Social History   Socioeconomic History   Marital status: Married    Spouse name: Not on file   Number of children: Not on file   Years of education: Not on file   Highest education level: Not on file  Occupational History   Not on file  Tobacco Use   Smoking status: Every Day    Packs/day: 0.50    Years: 30.00    Pack years: 15.00    Types: Cigarettes   Smokeless tobacco: Former   Tobacco comments:    14, 20s   Vaping Use   Vaping Use: Every day   Substances: Nicotine, Flavoring  Substance and Sexual Activity   Alcohol use: No   Drug use: No   Sexual activity: Not on file  Other Topics Concern   Not on file  Social History Narrative   Not on file   Social Determinants of Health   Financial Resource Strain: Not on file  Food Insecurity: Not on file  Transportation Needs: Not on file  Physical Activity: Not on file  Stress: Not on file  Social Connections: Not on file  Intimate Partner Violence: Not on file     Constitutional: Patient reports intermittent headaches.  Denies fever, malaise, fatigue, or abrupt weight changes.  HEENT: Denies eye pain, eye redness, ear pain, ringing in the ears, wax buildup, runny nose, nasal congestion, bloody nose, or sore throat. Respiratory: Patient reports chronic cough, intermittent shortness of breath.  Denies difficulty breathing, or sputum production.   Cardiovascular: Patient reports chest pressure.  Denies chest pain, chest tightness, palpitations or swelling in the hands or feet.  Gastrointestinal: Pt reports intermittent nausea. Denies abdominal pain, bloating, constipation, diarrhea or blood in the stool.  GU: Patient reports urinary urgency and frequency.  Denies pain with urination, burning sensation, blood in urine, odor or discharge. Musculoskeletal: Denies decrease in range of motion, difficulty with gait, muscle pain or joint pain and swelling.  Skin: Pt reports intermittent sweating. Denies redness, rashes, lesions or ulcercations.  Neurological: Pt reports intermittent dizziness, insomnia and restless legs. Denies dizziness, difficulty with memory, difficulty with speech or problems with balance and coordination.  Psych: Denies anxiety, depression, SI/HI.  No other specific complaints in a complete review of systems (except as listed in HPI above).  Objective:   Physical Exam   BP 103/64 (BP Location: Right Arm, Patient Position:  Sitting, Cuff Size: Small)   Pulse 94   Temp (!) 97 F (36.1 C) (Temporal)   Resp 17   Ht $R'5\' 2"'JS$  (1.575 m)   Wt 110 lb 3.2 oz (50 kg)   SpO2 98%   BMI 20.16 kg/m   Wt Readings from Last 3 Encounters:  06/17/21 110 lb (49.9 kg)  01/06/21 102 lb (46.3 kg)  07/02/20 104 lb (47.2 kg)    General: Appears her stated age, well developed, well nourished in NAD. Skin: Warm, dry and intact.  HEENT: Head: normal shape and size; Eyes: EOMs intact;  Neck:  Neck supple, trachea midline. No masses, lumps or thyromegaly present.  Cardiovascular: Normal  rate and rhythm. S1,S2 noted.  No murmur, rubs or gallops noted. No JVD or BLE edema. No carotid bruits noted. Pulmonary/Chest: Normal effort and positive vesicular breath sounds. No respiratory distress. No wheezes, rales or ronchi noted.  Abdomen: Soft and nontender. Normal bowel sounds. No distention or masses noted. Liver, spleen and kidneys non palpable. Musculoskeletal: Strength 5/5 BUE/BLE.  No difficulty with gait.  Neurological: Alert and oriented. Cranial nerves II-XII grossly intact. Coordination normal.  Psychiatric: Mood and affect normal. Behavior is normal. Judgment and thought content normal.    BMET    Component Value Date/Time   NA 134 (L) 06/17/2021 1501   K 3.7 06/17/2021 1501   CL 100 06/17/2021 1501   CO2 26 06/17/2021 1501   GLUCOSE 110 (H) 06/17/2021 1501   BUN 8 06/17/2021 1501   CREATININE 0.69 06/17/2021 1501   CREATININE 0.65 12/12/2019 0840   CALCIUM 8.9 06/17/2021 1501   GFRNONAA >60 06/17/2021 1501   GFRNONAA 98 12/12/2019 0840   GFRAA >60 01/03/2020 1210   GFRAA 113 12/12/2019 0840    Lipid Panel     Component Value Date/Time   CHOL 252 (H) 12/12/2019 0840   TRIG 86 12/12/2019 0840   HDL 83 12/12/2019 0840   CHOLHDL 3.0 12/12/2019 0840   VLDL 12 04/03/2017 0800   LDLCALC 150 (H) 12/12/2019 0840    CBC    Component Value Date/Time   WBC 6.1 06/17/2021 1501   RBC 5.39 (H) 06/17/2021 1501   HGB  16.2 (H) 06/17/2021 1501   HCT 47.1 (H) 06/17/2021 1501   HCT 43.6 12/08/2014 0828   PLT 185 06/17/2021 1501   MCV 87.4 06/17/2021 1501   MCH 30.1 06/17/2021 1501   MCHC 34.4 06/17/2021 1501   RDW 13.1 06/17/2021 1501   LYMPHSABS 1.6 06/17/2021 1501   MONOABS 1.7 (H) 06/17/2021 1501   EOSABS 0.0 06/17/2021 1501   BASOSABS 0.1 06/17/2021 1501    Hgb A1C Lab Results  Component Value Date   HGBA1C 5.2 04/03/2017           Assessment & Plan:   Preventative Health Maintenance:  She declines flu shot She reports her tetanus shot is UTD Encouraged her to get her covid booster She declines pneumovax Discussed Shingrix, she will check coverage with her insurance company She no longer needs Pap smears Mammogram ordered, she will call to schedule Bone density UTD Colon screening UTD Encouraged her to consume a balanced diet and exercise regimen Advised her to see an eye doctor and dentist annually Will check CBC, CMET, Lipid and A1C today  Nausea, Intermittent Sweating, Chest Pressure, Intermittent Dizziness:  She had a recent EKG which was reviewed Referral to cardiology for consideration of stress test  RTC in 6 months, follow up chronic conditions Webb Silversmith, NP This visit occurred during the SARS-CoV-2 public health emergency.  Safety protocols were in place, including screening questions prior to the visit, additional usage of staff PPE, and extensive cleaning of exam room while observing appropriate contact time as indicated for disinfecting solutions.

## 2021-06-24 NOTE — Assessment & Plan Note (Signed)
C-Met and lipid profile today Encouraged her to consume a low-fat diet 

## 2021-06-24 NOTE — Assessment & Plan Note (Signed)
CMET and lipid profile today Encouraged her to consume a low fat diet 

## 2021-06-25 ENCOUNTER — Ambulatory Visit: Payer: Self-pay

## 2021-06-25 LAB — HEMOGLOBIN A1C
Hgb A1c MFr Bld: 5.2 % of total Hgb (ref <5.7)
Mean Plasma Glucose: 103 mg/dL
eAG (mmol/L): 5.7 mmol/L

## 2021-06-25 LAB — COMPLETE METABOLIC PANEL WITH GFR
AG Ratio: 1.8 (calc) (ref 1.0–2.5)
ALT: 5 U/L — ABNORMAL LOW (ref 6–29)
AST: 14 U/L (ref 10–35)
Albumin: 3.9 g/dL (ref 3.6–5.1)
Alkaline phosphatase (APISO): 77 U/L (ref 37–153)
BUN: 11 mg/dL (ref 7–25)
CO2: 29 mmol/L (ref 20–32)
Calcium: 9 mg/dL (ref 8.6–10.4)
Chloride: 106 mmol/L (ref 98–110)
Creat: 0.59 mg/dL (ref 0.50–1.05)
Globulin: 2.2 g/dL (calc) (ref 1.9–3.7)
Glucose, Bld: 87 mg/dL (ref 65–99)
Potassium: 4.4 mmol/L (ref 3.5–5.3)
Sodium: 140 mmol/L (ref 135–146)
Total Bilirubin: 0.5 mg/dL (ref 0.2–1.2)
Total Protein: 6.1 g/dL (ref 6.1–8.1)
eGFR: 103 mL/min/{1.73_m2} (ref >=60)

## 2021-06-25 LAB — CBC
HCT: 48.4 % — ABNORMAL HIGH (ref 35.0–45.0)
Hemoglobin: 15.9 g/dL — ABNORMAL HIGH (ref 11.7–15.5)
MCH: 29.1 pg (ref 27.0–33.0)
MCHC: 32.9 g/dL (ref 32.0–36.0)
MCV: 88.5 fL (ref 80.0–100.0)
MPV: 9.7 fL (ref 7.5–12.5)
Platelets: 262 10*3/uL (ref 140–400)
RBC: 5.47 10*6/uL — ABNORMAL HIGH (ref 3.80–5.10)
RDW: 12.5 % (ref 11.0–15.0)
WBC: 8 10*3/uL (ref 3.8–10.8)

## 2021-06-25 LAB — MAGNESIUM: Magnesium: 2.1 mg/dL (ref 1.5–2.5)

## 2021-06-25 LAB — LIPID PANEL
Cholesterol: 214 mg/dL — ABNORMAL HIGH (ref <200)
HDL: 71 mg/dL (ref >=50)
LDL Cholesterol (Calc): 124 mg/dL (calc) — ABNORMAL HIGH
Non-HDL Cholesterol (Calc): 143 mg/dL (calc) — ABNORMAL HIGH (ref <130)
Total CHOL/HDL Ratio: 3 (calc) (ref <5.0)
Triglycerides: 87 mg/dL (ref <150)

## 2021-06-25 NOTE — Telephone Encounter (Signed)
Pt. Saw lab results in My Chart. Wants to review. Read results and instructions, verbalizes understanding.    Answer Assessment - Initial Assessment Questions 1. REASON FOR CALL or QUESTION: "What is your reason for calling today?" or "How can I best help you?" or "What question do you have that I can help answer?"     Saw labs in my Chart 2. CALLER: Document the source of call. (e.g., laboratory, patient).     Patient  Protocols used: PCP Call - No Triage-A-AH

## 2021-07-01 ENCOUNTER — Encounter: Payer: Self-pay | Admitting: Internal Medicine

## 2021-07-01 ENCOUNTER — Ambulatory Visit: Payer: BC Managed Care – PPO | Admitting: Internal Medicine

## 2021-07-01 ENCOUNTER — Other Ambulatory Visit: Payer: Self-pay

## 2021-07-01 VITALS — BP 130/80 | HR 80 | Ht 62.0 in | Wt 112.0 lb

## 2021-07-01 DIAGNOSIS — Q249 Congenital malformation of heart, unspecified: Secondary | ICD-10-CM | POA: Diagnosis not present

## 2021-07-01 DIAGNOSIS — M79604 Pain in right leg: Secondary | ICD-10-CM | POA: Diagnosis not present

## 2021-07-01 DIAGNOSIS — R072 Precordial pain: Secondary | ICD-10-CM | POA: Diagnosis not present

## 2021-07-01 DIAGNOSIS — R011 Cardiac murmur, unspecified: Secondary | ICD-10-CM

## 2021-07-01 DIAGNOSIS — M79605 Pain in left leg: Secondary | ICD-10-CM

## 2021-07-01 MED ORDER — FAMOTIDINE 20 MG PO TABS: 20.0000 mg | ORAL_TABLET | Freq: Two times a day (BID) | ORAL | Status: DC

## 2021-07-01 MED ORDER — ASPIRIN EC 81 MG PO TBEC: 81.0000 mg | DELAYED_RELEASE_TABLET | Freq: Every day | ORAL | Status: DC

## 2021-07-01 MED ORDER — METOPROLOL TARTRATE 100 MG PO TABS: 100.0000 mg | ORAL_TABLET | Freq: Once | ORAL | 0 refills | Status: DC

## 2021-07-01 NOTE — Progress Notes (Signed)
New Outpatient Visit Date: 07/01/2021  Referring Provider: Jearld Fenton, NP [address] [place],  [place] [address]  Chief Complaint: Chest and leg pain  HPI:  Molly Banks is a 60 y.o. female who is being seen today for the evaluation of chest pressure at the request of Molly Banks. She has a history of hyperlipidemia, COPD, aortic atherosclerosis, and arthritis.  Today, Molly Banks has a couple of complaints.  She states that she is very active but has been bothered by frequent cramping in her legs.  This most often happens in the evenings or at night.  She also complains of chest discomfort often after eating but also sometimes with exertion.  At times it feels like a tightening in the center of her chest while at other times it feels like pins-and-needles stabbing her.  She has tried an OTC antacid with some transient improvement.  The discomfort usually lasts about 10 to 15 minutes and occurs 3-4 times per week.  She also notes occasional palpitations happening randomly, usually at rest, that lasts less than 5 minutes.  There is some associated diaphoresis.  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  She notes having undergone a cerebral angiogram several years ago because of syncopal episodes.  She was told there was a "tiny spot" of something that did not require treatment.  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  No other significant abnormality was seen.  --------------------------------------------------------------------------------------------------  Cardiovascular History & Procedures: Cardiovascular Problems: Chest pain  Risk Factors: Hyperlipidemia, aortic atherosclerosis and tobacco abuse  Cath/PCI: None  CV Surgery: None  EP  Procedures and Devices: None  Non-Invasive Evaluation(s): None  Recent CV Pertinent Labs: Lab Results  Component Value Date   CHOL 214 (H) 06/24/2021   HDL 71 06/24/2021   LDLCALC 124 (H) 06/24/2021   TRIG 87 06/24/2021   CHOLHDL 3.0 06/24/2021   K 4.4 06/24/2021   MG 2.1 06/24/2021   BUN 11 06/24/2021   CREATININE 0.59 06/24/2021    --------------------------------------------------------------------------------------------------  Past Medical History:  Diagnosis Date   Arthritis    Breast lump in female 2005   COPD (chronic obstructive pulmonary disease) (HCC)    Emphysema lung (HCC)    Erythrocytosis    Frequent headaches    tension related   GERD (gastroesophageal reflux disease)    h/o   Hypoglycemia    Hypoglycemia    Leukocytosis    Thoracic scoliosis    Left concavity   Thyroid nodule    right side    Past Surgical History:  Procedure Laterality Date   ABDOMINAL HYSTERECTOMY     oophrectomy NOT perfomed   BREAST BIOPSY Left    [place]   CHOLECYSTECTOMY     SHOULDER ARTHROSCOPY WITH BICEPSTENOTOMY Right 07/31/2017   Procedure: SHOULDER ARTHROSCOPY WITH BICEPSTENOTOMY;  Surgeon: Lovell Sheehan, MD;  Location: [facility] ORS;  Service: Orthopedics;  Laterality: Right;   SHOULDER ARTHROSCOPY WITH SUBACROMIAL DECOMPRESSION Right 07/31/2017   Procedure: SHOULDER ARTHROSCOPY WITH SUBACROMIAL DECOMPRESSION;  Surgeon: Lovell Sheehan, MD;  Location: [facility] ORS;  Service: Orthopedics;  Laterality: Right;  Limited debridement   TOE SURGERY Left    4th toe   TONSILLECTOMY  1966    Current Meds  Medication Sig  ibuprofen (ADVIL) 800 MG tablet Take 800 mg by mouth every 8 (eight) hours as needed.   Magnesium 400 MG TABS Take 1 tablet by mouth at bedtime.   ondansetron (ZOFRAN ODT) 4 MG disintegrating tablet Take 1 tablet (4 mg total) by mouth every 8 (eight) hours as needed for nausea or vomiting.    Allergies: Guaifenesin, Tusso-df [hydrocodone-guaifenesin],  and Mobic [meloxicam]  Social History   Tobacco Use   Smoking status: Every Day    Packs/day: 0.50    Years: 30.00    Pack years: 15.00    Types: Cigarettes   Smokeless tobacco: Former   Tobacco comments:    9, 6s  Vaping Use   Vaping Use: Some days   Substances: Nicotine, Flavoring  Substance Use Topics   Alcohol use: No   Drug use: No    Family History  Problem Relation Age of Onset   Brain cancer Mother    Cancer Mother        brain   Hiatal hernia Mother    Lung cancer Father    COPD Father    Cancer - Lung Father    Lymphoma Sister    Breast cancer Sister 30       BRCA positive test   Breast cancer Daughter 56   Breast cancer Maternal Grandmother    Lung cancer Maternal Grandfather    Ovarian cancer Neg Hx    Colon cancer Neg Hx    Stroke Neg Hx    Heart disease Neg Hx     Review of Systems: A 12-system review of systems was performed and was negative except as noted in the HPI.  --------------------------------------------------------------------------------------------------  Physical Exam: BP 130/80 (BP Location: Right Arm, Patient Position: Sitting, Cuff Size: Normal)   Pulse 80   Ht 5' 2" (1.575 m)   Wt 112 lb (50.8 kg)   SpO2 98%   BMI 20.49 kg/m   General: NAD. HEENT: No conjunctival pallor or scleral icterus. Facemask in place. Neck: Supple without lymphadenopathy, thyromegaly, JVD, or HJR. No carotid bruit. Lungs: Normal work of breathing.  Mildly diminished breath sounds throughout without wheezes or crackles. Heart: Regular rate and rhythm with 2/6 holosystolic murmur.  No rubs or gallops.  Nondisplaced PMI. Abd: Bowel sounds present. Soft, NT/ND without hepatosplenomegaly Ext: No lower extremity edema.  2+ radial, 1+ dorsalis pedis and posterior tibial pulses bilaterally Skin: Warm and dry without rash. Neuro: No focal deficit. Psych: Normal mood and affect.  EKG: Normal sinus rhythm with possible left atrial enlargement.   Otherwise, no significant abnormality.  Lab Results  Component Value Date   WBC 8.0 06/24/2021   HGB 15.9 (H) 06/24/2021   HCT 48.4 (H) 06/24/2021   MCV 88.5 06/24/2021   PLT 262 06/24/2021    Lab Results  Component Value Date   NA 140 06/24/2021   K 4.4 06/24/2021   CL 106 06/24/2021   CO2 29 06/24/2021   BUN 11 06/24/2021   CREATININE 0.59 06/24/2021   GLUCOSE 87 06/24/2021   ALT 5 (L) 06/24/2021    Lab Results  Component Value Date   CHOL 214 (H) 06/24/2021   HDL 71 06/24/2021   LDLCALC 124 (H) 06/24/2021   TRIG 87 06/24/2021   CHOLHDL 3.0 06/24/2021     --------------------------------------------------------------------------------------------------  ASSESSMENT AND PLAN: Precordial pain: Molly Banks reports tightness and occasional pins-and-needles pain in her chest.  It is often associated with eating but also can occur with exertion.  Cardiac risk factors  include tobacco use and family history.  She also has this unclear history of possible congenital heart disease.  I recommended that we obtain an echocardiogram and a coronary CTA for further evaluation.  In the meantime, I have asked Molly Banks to begin taking aspirin 81 mg daily as well as famotidine 20 mg twice daily for possible GI component to her pain.  Congenital heart disease and heart murmur: Specifics regarding possible abnormal tissue in the heart are unclear.  Exam today notable for 2/6 holosystolic murmur.  No evidence of heart failure on examination today.  EKG shows some left atrial enlargement.  We will obtain an echocardiogram and coronary CTA.  Leg pain: Patient reports cramping, predominantly at night but also sometimes with exertion.  She has a long history of tobacco use placing her at increased risk for PAD.  We have agreed to obtain ABIs and lower extremity arterial Dopplers.  Tobacco abuse: Molly Banks is in the process of quitting.  I encouraged her to continue with this.  Follow-up: Return to  clinic in 6 weeks.  Molly Bush, MD 07/01/2021 1:30 PM

## 2021-07-01 NOTE — Patient Instructions (Signed)
Medication Instructions:   Your physician has recommended you make the following change in your medication:   START Aspirin 81 mg daily - (over-the-counter)  START Famotidine 20 mg TWICE daily - (over-the-counter)  *If you need a refill on your cardiac medications before your next appointment, please call your pharmacy*   Lab Work:  None ordered  Testing/Procedures:  1) Your physician has requested that you have a lower extremity arterial exercise duplex. During this test, exercise and ultrasound are used to evaluate arterial blood flow in the legs. Allow one hour for this exam. There are no restrictions or special instructions.  2) Your physician has requested that you have an ankle brachial index (ABI). During this test an ultrasound and blood pressure cuff are used to evaluate the arteries that supply the arms and legs with blood. Allow thirty minutes for this exam. There are no restrictions or special instructions.  3) Your physician has requested that you have an echocardiogram. Echocardiography is a painless test that uses sound waves to create images of your heart. It provides your doctor with information about the size and shape of your heart and how well your heart's chambers and valves are working. This procedure takes approximately one hour. There are no restrictions for this procedure.  4) Your cardiac CT will be scheduled at one of the below locations:   [facility] [address] [place], [place] [address] [phone]  OR  [facility] [address] [place], [place] [address] [phone]  If scheduled at [facility], please arrive at the [facility] main entrance (entrance A) of [facility] 30 minutes prior to test start time. You can use the FREE valet parking offered at the main entrance (encouraged to control the heart rate for the test) Proceed to the [facility] Radiology Department  (first floor) to check-in and test prep.  If scheduled at [facility], please arrive 15 mins early for check-in and test prep.  Please follow these instructions carefully (unless otherwise directed):   On the Night Before the Test: Be sure to Drink plenty of water. Do not consume any caffeinated/decaffeinated beverages or chocolate 12 hours prior to your test. Do not take any antihistamines 12 hours prior to your test.   On the Day of the Test: Drink plenty of water until 1 hour prior to the test. Do not eat any food 4 hours prior to the test. You may take your regular medications prior to the test.  Take metoprolol (Lopressor) two hours prior to test. FEMALES- please wear underwire-free bra if available, avoid dresses & tight clothing        After the Test: Drink plenty of water. After receiving IV contrast, you may experience a mild flushed feeling. This is normal. On occasion, you may experience a mild rash up to 24 hours after the test. This is not dangerous. If this occurs, you can take Benadryl 25 mg and increase your fluid intake. If you experience trouble breathing, this can be serious. If it is severe call 911 IMMEDIATELY. If it is mild, please call our office. If you take any of these medications: Glipizide/Metformin, Avandament, Glucavance, please do not take 48 hours after completing test unless otherwise instructed.  Please allow 2-4 weeks for scheduling of routine cardiac CTs. Some insurance companies require a pre-authorization which may delay scheduling of this test.   For non-scheduling related questions, please contact the cardiac imaging nurse navigator  should you have any questions/concerns: Rockwell Alexandria, Cardiac Imaging Nurse Navigator Larey Brick, Cardiac Imaging Nurse Navigator [facility] Direct Office Dial: [phone]   For scheduling needs, including cancellations and rescheduling, please call  [place], [phone].    Follow-Up: At [facility], you and your health needs are our priority.  As part of our continuing mission to provide you with exceptional heart care, we have created designated Provider Care Teams.  These Care Teams include your primary Cardiologist (physician) and Advanced Practice Providers (APPs -  Physician Assistants and Nurse Practitioners) who all work together to provide you with the care you need, when you need it.  We recommend signing up for the patient portal called "MyChart".  Sign up information is provided on this After Visit Summary.  MyChart is used to connect with patients for Virtual Visits (Telemedicine).  Patients are able to view lab/test results, encounter notes, upcoming appointments, etc.  Non-urgent messages can be sent to your provider as well.   To learn more about what you can do with MyChart, go to [email or web address].    Your next appointment:   6 week(s)  The format for your next appointment:   In Person  Provider:   You may see Dr. Cristal Deer End or one of the following Advanced Practice Providers on your designated Care Team:   Nicolasa Ducking, NP Eula Listen, PA-C Cadence Fransico Michael, [place]

## 2021-07-16 ENCOUNTER — Telehealth (HOSPITAL_COMMUNITY): Payer: Self-pay | Admitting: Emergency Medicine

## 2021-07-16 NOTE — Telephone Encounter (Signed)
Attempted to call patient x 2 to review CCTA instructions. Patient answered phone both times, said nothing, and hung up.  Rockwell Alexandria RN Navigator Cardiac Imaging [facility] [phone] Office  [phone] Cell

## 2021-07-19 ENCOUNTER — Ambulatory Visit
Admission: RE | Admit: 2021-07-19 | Discharge: 2021-07-19 | Disposition: A | Discharge status: Home / Self Care | Payer: BC Managed Care – PPO | Source: Ambulatory Visit | Attending: Internal Medicine | Admitting: Internal Medicine

## 2021-07-19 ENCOUNTER — Other Ambulatory Visit: Payer: Self-pay

## 2021-07-19 DIAGNOSIS — R072 Precordial pain: Secondary | ICD-10-CM

## 2021-07-19 DIAGNOSIS — R011 Cardiac murmur, unspecified: Secondary | ICD-10-CM

## 2021-07-19 DIAGNOSIS — Q249 Congenital malformation of heart, unspecified: Secondary | ICD-10-CM

## 2021-07-19 MED ORDER — NITROGLYCERIN 0.4 MG SL SUBL
0.8000 mg | SUBLINGUAL_TABLET | Freq: Once | SUBLINGUAL | Status: AC
  Administered 2021-07-19: 14:00:00 0.8 mg via SUBLINGUAL

## 2021-07-19 MED ORDER — METOPROLOL TARTRATE 5 MG/5ML IV SOLN
10.0000 mg | Freq: Once | INTRAVENOUS | Status: AC
  Administered 2021-07-19: 14:00:00 10 mg via INTRAVENOUS

## 2021-07-19 MED ORDER — IOHEXOL 350 MG/ML SOLN
75.0000 mL | Freq: Once | INTRAVENOUS | Status: AC | PRN
  Administered 2021-07-19: 14:00:00 75 mL via INTRAVENOUS

## 2021-07-19 NOTE — Progress Notes (Signed)
Patient tolerated procedure well. Ambulate w/o difficulty. Denies light headedness or being dizzy. Sitting in chair drinking water provided. Encouraged to drink extra water today and reasoning explained. Verbalized understanding. All questions answered. ABC intact. No further needs. Discharge from procedure area w/o issues.   °

## 2021-07-30 ENCOUNTER — Ambulatory Visit: Payer: Self-pay | Admitting: *Deleted

## 2021-07-30 NOTE — Telephone Encounter (Signed)
Patient has an abcess in her mouth, asking for amoxicilin to be sent to [facility] [address], [place] - [address]  Phone: [phone]  Fax: [phone]    Called patient to review symptoms of abscess. C/o abscess in mouth from right bottom tooth since yesterday . Pain from tooth that has broken off. Patient requesting amoxicillin prior to abscess getting worse. Reports she does not have the money at this time to go to dentist to get tooth fixed. She will have money in the next few weeks. Denies fever, facial swelling. Able to work and requesting a call back before 2 pm when she leaves work. Requesting medication sent to [facility] pharmacy off of [address] in [place]. Please advise .

## 2021-07-30 NOTE — Telephone Encounter (Signed)
Reason for Disposition  Toothache present > 24 hours  Answer Assessment - Initial Assessment Questions 1. LOCATION: "Which tooth is hurting?"  (e.g., right-side/left-side, upper/lower, front/back)     Right bottom back tooth  2. ONSET: "When did the toothache start?"  (e.g., hours, days)      Yesterday  3. SEVERITY: "How bad is the toothache?"  (Scale 1-10; mild, moderate or severe)   - MILD (1-3): doesn't interfere with chewing    - MODERATE (4-7): interferes with chewing, interferes with normal activities, awakens from sleep     - SEVERE (8-10): unable to eat, unable to do any normal activities, excruciating pain        Able to work and eat but doesn't want it to get worse 4. SWELLING: "Is there any visible swelling of your face?"     No  5. OTHER SYMPTOMS: "Do you have any other symptoms?" (e.g., fever)     Denies  6. PREGNANCY: "Is there any chance you are pregnant?" "When was your last menstrual period?"     na  Protocols used: [facility]

## 2021-08-02 NOTE — Telephone Encounter (Signed)
She will need to make an appt for this. Abx are not sent in without evaluation

## 2021-08-04 NOTE — Telephone Encounter (Signed)
The pt was notified of the recommendation. She verbalize understanding, no questions or concerns.  

## 2021-08-12 ENCOUNTER — Ambulatory Visit (INDEPENDENT_AMBULATORY_CARE_PROVIDER_SITE_OTHER): Payer: BC Managed Care – PPO

## 2021-08-12 ENCOUNTER — Other Ambulatory Visit: Payer: Self-pay

## 2021-08-12 DIAGNOSIS — R072 Precordial pain: Secondary | ICD-10-CM

## 2021-08-12 DIAGNOSIS — R011 Cardiac murmur, unspecified: Secondary | ICD-10-CM

## 2021-08-12 DIAGNOSIS — Q249 Congenital malformation of heart, unspecified: Secondary | ICD-10-CM

## 2021-08-12 DIAGNOSIS — M79605 Pain in left leg: Secondary | ICD-10-CM

## 2021-08-12 DIAGNOSIS — M79604 Pain in right leg: Secondary | ICD-10-CM

## 2021-08-12 LAB — ECHOCARDIOGRAM COMPLETE
AR max vel: 2.39 cm2
AV Area VTI: 2.19 cm2
AV Area mean vel: 2.25 cm2
AV Mean grad: 3 mmHg
AV Peak grad: 5.4 mmHg
Ao pk vel: 1.16 m/s
Area-P 1/2: 3.87 cm2
S' Lateral: 2.5 cm
Single Plane A4C EF: 53.2 %

## 2021-08-12 MED ORDER — PERFLUTREN LIPID MICROSPHERE
1.0000 mL | INTRAVENOUS | Status: AC | PRN
Start: 2021-08-12 — End: 2021-08-12
  Administered 2021-08-12: 2 mL via INTRAVENOUS

## 2021-08-18 ENCOUNTER — Encounter: Payer: Self-pay | Admitting: Internal Medicine

## 2021-08-18 ENCOUNTER — Ambulatory Visit (INDEPENDENT_AMBULATORY_CARE_PROVIDER_SITE_OTHER): Payer: BC Managed Care – PPO | Admitting: Internal Medicine

## 2021-08-18 ENCOUNTER — Other Ambulatory Visit: Payer: Self-pay

## 2021-08-18 VITALS — BP 128/80 | HR 72 | Ht 62.0 in | Wt 114.0 lb

## 2021-08-18 DIAGNOSIS — E7800 Pure hypercholesterolemia, unspecified: Secondary | ICD-10-CM

## 2021-08-18 DIAGNOSIS — I422 Other hypertrophic cardiomyopathy: Secondary | ICD-10-CM | POA: Diagnosis not present

## 2021-08-18 DIAGNOSIS — M79604 Pain in right leg: Secondary | ICD-10-CM

## 2021-08-18 DIAGNOSIS — Z720 Tobacco use: Secondary | ICD-10-CM

## 2021-08-18 DIAGNOSIS — I421 Obstructive hypertrophic cardiomyopathy: Secondary | ICD-10-CM

## 2021-08-18 DIAGNOSIS — M79605 Pain in left leg: Secondary | ICD-10-CM

## 2021-08-18 NOTE — Patient Instructions (Signed)
Medication Instructions:   Your physician has recommended you make the following change in your medication:   STOP Aspirin 81 mg  *If you need a refill on your cardiac medications before your next appointment, please call your pharmacy*   Lab Work:  Your provider has ordered lab work (hemoglobin and hematocrit) to be drawn before you leave today.   If you have labs (blood work) drawn today and your tests are completely normal, you will receive your results only by: MyChart Message (if you have MyChart) OR A paper copy in the mail If you have any lab test that is abnormal or we need to change your treatment, we will call you to review the results.   Testing/Procedures:  Your provider has ordered a Cardiac MRI, they will call you to schedule this. Please arrive at the [facility] main entrance of [facility] at _________ (30-45 minutes prior to test start time). ?  [facility]  [address]  [place], [place] [address]  [phone]  Proceed to the [facility] Radiology Department (First Floor).  ?  Magnetic resonance imaging (MRI) is a painless test that produces images of the inside of the body without using X-rays. During an MRI, strong magnets and radio waves work together in a Data processing manager to form detailed images. MRI images may provide more details about a medical condition than X-rays, CT scans, and ultrasounds can provide.  You may be given earphones to listen for instructions.  You may eat a light breakfast and take medications as ordered.   An IV will be inserted into one of your veins and contrast material will be injected into your IV.  You will be asked to remove all metal, including: Watch, jewelry, and other metal objects including hearing aids, hair pieces and dentures. (Braces and fillings normally are not a problem.)  If contrast material was used:  It will leave your body through your urine within a day. You may be told to drink plenty of  fluids to help flush the contrast material out of your system.  TEST WILL TAKE APPROXIMATELY 1 HOUR  PLEASE NOTIFY SCHEDULING AT LEAST 24 HOURS IN ADVANCE IF YOU ARE UNABLE TO KEEP YOUR APPOINTMENT.      Follow-Up: At [facility], you and your health needs are our priority.  As part of our continuing mission to provide you with exceptional heart care, we have created designated Provider Care Teams.  These Care Teams include your primary Cardiologist (physician) and Advanced Practice Providers (APPs -  Physician Assistants and Nurse Practitioners) who all work together to provide you with the care you need, when you need it.  We recommend signing up for the patient portal called "MyChart".  Sign up information is provided on this After Visit Summary.  MyChart is used to connect with patients for Virtual Visits (Telemedicine).  Patients are able to view lab/test results, encounter notes, upcoming appointments, etc.  Non-urgent messages can be sent to your provider as well.   To learn more about what you can do with MyChart, go to [email or web address].    Your next appointment:   6 weeks  The format for your next appointment:   In Person  Provider:   You may see Yvonne Kendall, MD or one of the following Advanced Practice Providers on your designated Care Team:   Nicolasa Ducking, NP Eula Listen, PA-C Cadence Fransico Michael, PA-C :1}    Other Instructions N/A

## 2021-08-18 NOTE — Progress Notes (Signed)
Follow-up Outpatient Visit Date: 08/18/2021  Primary Care Provider: Jearld Fenton, NP [place] [address]  Chief Complaint: Follow-up chest and leg pain and abnormal echocardiogram  HPI:  Molly Banks is a 60 y.o. 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  I met her last month, at which time Molly Banks complained of atypical chest pain as well as frequent cramping in her legs.  Subsequent ABIs were normal.  Coronary CTA was normal without evidence of CAD.  Echocardiogram showed low normal LVEF with moderate asymmetric hypertrophy involving the inferior and lateral walls as well as the apical and periapical regions.  Concern was raised for noncompaction.  Today Molly Banks reports that she feels about the same as at prior visits.  She continues to have occasional vague chest pain not related to specific activities.  She has exertional dyspnea when walking uphill or going up steps.  Despite this, she tries to walk on a daily basis.  She notes occasional palpitations that can last up to 15 minutes at a time that occur once or twice a month.  During the palpitations, she feels like someone is pushing on her chest.  She has occasional lightheadedness though not clearly associated with the aforementioned palpitations.  She reports a single episode of syncope many years ago.  She has mild dependent leg edema.  She is trying to stop smoking and has cut down to 1/2 pack/day.  --------------------------------------------------------------------------------------------------  Cardiovascular History & Procedures: Cardiovascular Problems: Chest pain Questionable hypertrophic cardiomyopathy/noncompaction   Risk Factors: Hyperlipidemia, aortic atherosclerosis and tobacco abuse   Cath/PCI: None   CV Surgery: None   EP Procedures and  Devices: None   Non-Invasive Evaluation(s): TTE (08/12/2021): Normal LV size with moderate asymmetric LVH involving the inferior/lateral walls, apex, and periapical regions.  There is concern for noncompaction involving the LV as well as the RV apex.  Grade 2 diastolic dysfunction noted.  RV size and function are normal.  There is mild mitral regurgitation.  CVP elevated. ABIs (08/12/2021): Normal ABIs and TBI's bilaterally. Coronary CTA (07/19/2021): Normal coronary arteries without evidence of CAD.  Coronary calcium score 0.  No acute extracardiac abnormality.  Emphysematous changes noted in the visualized lungs.  Recent CV Pertinent Labs: Lab Results  Component Value Date   CHOL 214 (H) 06/24/2021   HDL 71 06/24/2021   LDLCALC 124 (H) 06/24/2021   TRIG 87 06/24/2021   CHOLHDL 3.0 06/24/2021   K 4.4 06/24/2021   MG 2.1 06/24/2021   BUN 11 06/24/2021   CREATININE 0.59 06/24/2021    Past medical and surgical history were reviewed and updated in EPIC.  Current Meds  Medication Sig   famotidine (PEPCID) 20 MG tablet Take 1 tablet (20 mg total) by mouth 2 (two) times daily.   ibuprofen (ADVIL) 800 MG tablet Take 800 mg by mouth every 8 (eight) hours as needed.   Magnesium 400 MG TABS Take 1 tablet by mouth at bedtime.   metoprolol tartrate (LOPRESSOR) 100 MG tablet Take 1 tablet (100 mg total) by mouth once for 1 dose. Take TWO hours prior to CT procedure   ondansetron (ZOFRAN ODT) 4 MG disintegrating tablet Take 1 tablet (4 mg total) by mouth every 8 (eight) hours as needed for nausea or vomiting.   [DISCONTINUED] aspirin EC 81 MG tablet Take 1 tablet (81 mg total) by mouth daily.  Swallow whole.    Allergies: Guaifenesin, Tusso-df [hydrocodone-guaifenesin], and Mobic [meloxicam]  Social History   Tobacco Use   Smoking status: Every Day    Packs/day: 0.50    Years: 30.00    Pack years: 15.00    Types: Cigarettes   Smokeless tobacco: Former   Tobacco comments:    86, 40s   Vaping Use   Vaping Use: Some days   Substances: Nicotine, Flavoring  Substance Use Topics   Alcohol use: No   Drug use: No    Family History  Problem Relation Age of Onset   Brain cancer Mother    Cancer Mother        brain   Hiatal hernia Mother    Heart attack Father 2   Lung cancer Father    COPD Father    Cancer - Lung Father    Lymphoma Sister    Breast cancer Sister 67       BRCA positive test   Breast cancer Maternal Grandmother    Lung cancer Maternal Grandfather    Breast cancer Daughter 16   Ovarian cancer Neg Hx    Colon cancer Neg Hx    Stroke Neg Hx    Heart disease Neg Hx     Review of Systems: A 12-system review of systems was performed and was negative except as noted in the HPI.  --------------------------------------------------------------------------------------------------  Physical Exam: BP 128/80 (BP Location: Left Arm, Patient Position: Sitting, Cuff Size: Normal)    Pulse 72    Ht 5' 2"  (1.575 m)    Wt 114 lb (51.7 kg)    SpO2 99%    BMI 20.85 kg/m   General:  NAD. Neck: No JVD or HJR. Lungs: Clear to auscultation bilaterally without wheezes or crackles. Heart: Regular rate and rhythm with 1/6 systolic murmur.  No rubs or gallops. Abdomen: Soft, nontender, nondistended. Extremities: No lower extremity edema.  Lab Results  Component Value Date   WBC 8.0 06/24/2021   HGB 14.2 08/18/2021   HCT 40.9 08/18/2021   MCV 88.5 06/24/2021   PLT 262 06/24/2021    Lab Results  Component Value Date   NA 140 06/24/2021   K 4.4 06/24/2021   CL 106 06/24/2021   CO2 29 06/24/2021   BUN 11 06/24/2021   CREATININE 0.59 06/24/2021   GLUCOSE 87 06/24/2021   ALT 5 (L) 06/24/2021    Lab Results  Component Value Date   CHOL 214 (H) 06/24/2021   HDL 71 06/24/2021   LDLCALC 124 (H) 06/24/2021   TRIG 87 06/24/2021   CHOLHDL 3.0 06/24/2021     --------------------------------------------------------------------------------------------------  ASSESSMENT AND PLAN: Hypertrophic cardiomyopathy: Recent echocardiogram showed asymmetric hypertrophy predominantly involving the inferolateral and apical portions of the myocardium.  Question of noncompaction was also raised.  Molly Banks continues to have some nonspecific symptoms with recent coronary CTA showing no significant CAD.  Given these findings, we have agreed to obtain a cardiac MRI to better evaluate for hypertrophic cardiomyopathy.  We we will discontinue aspirin given absence of CAD.  Leg pain: Leg cramps have improved with magnesium use.  Recent ABIs were normal.  No further work-up at this time.  Hyperlipidemia: LDL mildly elevated on last check.  Given absence of established ASCVD, continued lifestyle modifications are recommended.  Tobacco use: I congratulated Molly Banks on cutting down on her tobacco use and encouraged her to quit altogether.  Follow-up: Return to clinic after completion of cardiac MRI.  Nelva Bush, MD 08/19/2021  2:49 PM

## 2021-08-19 ENCOUNTER — Encounter: Payer: Self-pay | Admitting: Internal Medicine

## 2021-08-19 DIAGNOSIS — E7800 Pure hypercholesterolemia, unspecified: Secondary | ICD-10-CM | POA: Insufficient documentation

## 2021-08-19 DIAGNOSIS — Z720 Tobacco use: Secondary | ICD-10-CM | POA: Insufficient documentation

## 2021-08-19 LAB — HEMOGLOBIN AND HEMATOCRIT, BLOOD
Hematocrit: 40.9 % (ref 34.0–46.6)
Hemoglobin: 14.2 g/dL (ref 11.1–15.9)

## 2021-09-14 ENCOUNTER — Telehealth (HOSPITAL_COMMUNITY): Payer: Self-pay | Admitting: *Deleted

## 2021-09-14 NOTE — Telephone Encounter (Signed)
Reaching out to patient to offer assistance regarding upcoming cardiac imaging study; pt verbalizes understanding of appt date/time, parking situation and where to check in, pre-test NPO status and verified current allergies; name and call back number provided for further questions should they arise  Larey Brick RN Navigator Cardiac Imaging [facility] [phone] office [phone] cell  Denies metal or claustrophilia.

## 2021-09-15 ENCOUNTER — Other Ambulatory Visit: Payer: Self-pay

## 2021-09-15 ENCOUNTER — Ambulatory Visit (HOSPITAL_COMMUNITY)
Admission: RE | Admit: 2021-09-15 | Discharge: 2021-09-15 | Disposition: A | Discharge status: Home / Self Care | Payer: BC Managed Care – PPO | Source: Ambulatory Visit | Attending: Internal Medicine | Admitting: Internal Medicine

## 2021-09-15 DIAGNOSIS — I422 Other hypertrophic cardiomyopathy: Secondary | ICD-10-CM | POA: Diagnosis not present

## 2021-09-15 MED ORDER — GADOBUTROL 1 MMOL/ML IV SOLN
7.0000 mL | Freq: Once | INTRAVENOUS | Status: AC | PRN
  Administered 2021-09-15: 15:00:00 7 mL via INTRAVENOUS

## 2021-09-29 ENCOUNTER — Ambulatory Visit: Payer: BC Managed Care – PPO | Admitting: Internal Medicine

## 2022-01-13 ENCOUNTER — Ambulatory Visit
Admission: RE | Admit: 2022-01-13 | Discharge: 2022-01-13 | Disposition: A | Discharge status: Home / Self Care | Payer: BC Managed Care – PPO | Source: Ambulatory Visit | Attending: Internal Medicine | Admitting: Internal Medicine

## 2022-01-13 DIAGNOSIS — Z1231 Encounter for screening mammogram for malignant neoplasm of breast: Secondary | ICD-10-CM | POA: Insufficient documentation

## 2022-01-14 ENCOUNTER — Other Ambulatory Visit: Payer: Self-pay | Admitting: *Deleted

## 2022-01-14 ENCOUNTER — Inpatient Hospital Stay
Admission: RE | Admit: 2022-01-14 | Discharge: 2022-01-14 | Disposition: A | Discharge status: Home / Self Care | Payer: Self-pay | Source: Ambulatory Visit | Attending: *Deleted | Admitting: *Deleted

## 2022-01-14 DIAGNOSIS — Z1231 Encounter for screening mammogram for malignant neoplasm of breast: Secondary | ICD-10-CM

## 2022-03-02 ENCOUNTER — Telehealth: Payer: Self-pay | Admitting: Internal Medicine

## 2022-03-02 NOTE — Telephone Encounter (Signed)
Made 3 attempts to schedule removing recall. 

## 2022-06-29 ENCOUNTER — Encounter: Payer: Self-pay | Admitting: Internal Medicine

## 2022-06-29 ENCOUNTER — Ambulatory Visit (INDEPENDENT_AMBULATORY_CARE_PROVIDER_SITE_OTHER): Payer: BC Managed Care – PPO | Admitting: Internal Medicine

## 2022-06-29 VITALS — BP 136/82 | HR 63 | Temp 96.4°F | Ht 62.0 in | Wt 123.0 lb

## 2022-06-29 DIAGNOSIS — Z122 Encounter for screening for malignant neoplasm of respiratory organs: Secondary | ICD-10-CM | POA: Diagnosis not present

## 2022-06-29 DIAGNOSIS — Z0001 Encounter for general adult medical examination with abnormal findings: Secondary | ICD-10-CM | POA: Diagnosis not present

## 2022-06-29 DIAGNOSIS — R6881 Early satiety: Secondary | ICD-10-CM

## 2022-06-29 DIAGNOSIS — J410 Simple chronic bronchitis: Secondary | ICD-10-CM

## 2022-06-29 DIAGNOSIS — R140 Abdominal distension (gaseous): Secondary | ICD-10-CM | POA: Diagnosis not present

## 2022-06-29 DIAGNOSIS — Z780 Asymptomatic menopausal state: Secondary | ICD-10-CM

## 2022-06-29 DIAGNOSIS — I700 Atherosclerosis of aorta: Secondary | ICD-10-CM

## 2022-06-29 NOTE — Progress Notes (Signed)
Subjective:    Patient ID: Molly Banks, female    DOB: 05-30-1961, 61 y.o.   MRN: [id number]  HPI  Patient presents to clinic today for her annual exam.  Flu: never Tetanus: < 10 years COVID: Janssen x1 Shingrix: never Pap smear: Hysterectomy Mammogram: 12/2021 Bone density: 03/2018 Colon screening: 12/2013 Vision screening: annually Dentist: as needed, dentures  Diet: She does eat meat. She consumes fruits and veggies. She does eat fried foods. She drinks mostly soda. Exercise: Walking  Review of Systems  Past Medical History:  Diagnosis Date   Arthritis    Breast lump in female 2005   COPD (chronic obstructive pulmonary disease) (HCC)    Emphysema lung (HCC)    Erythrocytosis    Frequent headaches    tension related   GERD (gastroesophageal reflux disease)    h/o   Hypoglycemia    Hypoglycemia    Leukocytosis    Thoracic scoliosis    Left concavity   Thyroid nodule    right side    Current Outpatient Medications  Medication Sig Dispense Refill   famotidine (PEPCID) 20 MG tablet Take 1 tablet (20 mg total) by mouth 2 (two) times daily.     ibuprofen (ADVIL) 800 MG tablet Take 800 mg by mouth every 8 (eight) hours as needed.     Magnesium 400 MG TABS Take 1 tablet by mouth at bedtime.     metoprolol tartrate (LOPRESSOR) 100 MG tablet Take 1 tablet (100 mg total) by mouth once for 1 dose. Take TWO hours prior to CT procedure 1 tablet 0   ondansetron (ZOFRAN ODT) 4 MG disintegrating tablet Take 1 tablet (4 mg total) by mouth every 8 (eight) hours as needed for nausea or vomiting. 20 tablet 0   No current facility-administered medications for this visit.    Allergies  Allergen Reactions   Guaifenesin Shortness Of Breath and Itching   Tusso-Df [Hydrocodone-Guaifenesin] Shortness Of Breath   Mobic [Meloxicam] Other (See Comments)    headache    Family History  Problem Relation Age of Onset   Brain cancer Mother    Cancer Mother        brain   Hiatal  hernia Mother    Heart attack Father 77   Lung cancer Father    COPD Father    Cancer - Lung Father    Lymphoma Sister    Breast cancer Sister 71       BRCA positive test   Breast cancer Maternal Grandmother    Lung cancer Maternal Grandfather    Breast cancer Daughter 90   Ovarian cancer Neg Hx    Colon cancer Neg Hx    Stroke Neg Hx    Heart disease Neg Hx     Social History   Socioeconomic History   Marital status: Married    Spouse name: Not on file   Number of children: Not on file   Years of education: Not on file   Highest education level: Not on file  Occupational History   Not on file  Tobacco Use   Smoking status: Every Day    Packs/day: 0.50    Years: 30.00    Total pack years: 15.00    Types: Cigarettes   Smokeless tobacco: Former   Tobacco comments:    14, 20s  Vaping Use   Vaping Use: Some days   Substances: Nicotine, Flavoring  Substance and Sexual Activity   Alcohol use: No   Drug use: No  Sexual activity: Not on file  Other Topics Concern   Not on file  Social History Narrative   Not on file   Social Determinants of Health   Financial Resource Strain: Not on file  Food Insecurity: Not on file  Transportation Needs: Not on file  Physical Activity: Not on file  Stress: Not on file  Social Connections: Not on file  Intimate Partner Violence: Not on file     Constitutional: Patient reports intermittent headaches.  Denies fever, malaise, fatigue, or abrupt weight changes.  HEENT: Denies eye pain, eye redness, ear pain, ringing in the ears, wax buildup, runny nose, nasal congestion, bloody nose, or sore throat. Respiratory: Denies difficulty breathing, shortness of breath, cough or sputum production.   Cardiovascular: Denies chest pain, chest tightness, palpitations or swelling in the hands or feet.  Gastrointestinal: Patient reports early satiety and abdominal bloating.  Denies constipation, diarrhea or blood in the stool.  GU: Patient  reports urinary frequency and urgency.  Denies pain with urination, burning sensation, blood in urine, odor or discharge. Musculoskeletal: Denies decrease in range of motion, difficulty with gait, muscle pain or joint pain and swelling.  Skin: Denies redness, rashes, lesions or ulcercations.  Neurological: Patient reports insomnia and restless legs.  Denies dizziness, difficulty with memory, difficulty with speech or problems with balance and coordination.  Psych: Denies anxiety, depression, SI/HI.  No other specific complaints in a complete review of systems (except as listed in HPI above).     Objective:   Physical Exam BP 136/82 (BP Location: Right Arm, Patient Position: Sitting, Cuff Size: Normal)   Pulse 63   Temp (!) 96.4 F (35.8 C) (Temporal)   Ht _0  (1.575 m)   Wt 123 lb (55.8 kg)   SpO2 99%   BMI 22.50 kg/m   Wt Readings from Last 3 Encounters:  08/18/21 114 lb (51.7 kg)  07/01/21 112 lb (50.8 kg)  06/24/21 110 lb 3.2 oz (50 kg)    General: Appears her stated age, well developed, well nourished in NAD. Skin: Warm, dry and intact. No rashes, lesions or ulcerations noted. HEENT: Head: normal shape and size; Eyes: sclera white, no icterus, conjunctiva pink, PERRLA and EOMs intact;  Neck:  Neck supple, trachea midline. No masses, lumps or thyromegaly present.  Cardiovascular: Normal rate and rhythm. S1,S2 noted.  No murmur, rubs or gallops noted. No JVD or BLE edema. No carotid bruits noted. Pulmonary/Chest: Normal effort and positive vesicular breath sounds. No respiratory distress. No wheezes, rales or ronchi noted.  Abdomen: Soft and nontender. Normal bowel sounds.  Musculoskeletal: Strength 5/5 BUE/BLE.  No difficulty with gait.  Neurological: Alert and oriented. Cranial nerves II-XII grossly intact. Coordination normal.  Psychiatric: Mood and affect normal. Behavior is normal. Judgment and thought content normal.    BMET    Component Value Date/Time   NA 140  06/24/2021 0758   K 4.4 06/24/2021 0758   CL 106 06/24/2021 0758   CO2 29 06/24/2021 0758   GLUCOSE 87 06/24/2021 0758   BUN 11 06/24/2021 0758   CREATININE 0.59 06/24/2021 0758   CALCIUM 9.0 06/24/2021 0758   GFRNONAA >60 06/17/2021 1501   GFRNONAA 98 12/12/2019 0840   GFRAA >60 01/03/2020 1210   GFRAA 113 12/12/2019 0840    Lipid Panel     Component Value Date/Time   CHOL 214 (H) 06/24/2021 0758   TRIG 87 06/24/2021 0758   HDL 71 06/24/2021 0758   CHOLHDL 3.0 06/24/2021 0758  VLDL 12 04/03/2017 0800   LDLCALC 124 (H) 06/24/2021 0758    CBC    Component Value Date/Time   WBC 8.0 06/24/2021 0758   RBC 5.47 (H) 06/24/2021 0758   HGB 14.2 08/18/2021 1543   HCT 40.9 08/18/2021 1543   PLT 262 06/24/2021 0758   MCV 88.5 06/24/2021 0758   MCH 29.1 06/24/2021 0758   MCHC 32.9 06/24/2021 0758   RDW 12.5 06/24/2021 0758   LYMPHSABS 1.6 06/17/2021 1501   MONOABS 1.7 (H) 06/17/2021 1501   EOSABS 0.0 06/17/2021 1501   BASOSABS 0.1 06/17/2021 1501    Hgb A1C Lab Results  Component Value Date   HGBA1C 5.2 06/24/2021            Assessment & Plan:   Preventative Health Maintenance:  She declines flu shot today Tetanus UTD per her report Encouraged her to get her COVID booster Discussed Shingrix vaccine, she will check coverage with her insurance company and schedule a nurse visit if she would like to have this done She no longer needs Pap smears Mammogram UTD Bone density ordered-she will call to schedule Colon screening UTD CT lung cancer screening ordered Encouraged her to consume a balanced diet and exercise regimen Advised her to see an eye doctor and dentist annually We will check CBC, c-Met and lipid profile today  Early Satiety, Abdominal Bloating:  We will check H. pylori breath test We will have her start taking her famotidine daily if H. pylori negative  RTC in 6 months, follow-up chronic conditions Webb Silversmith, NP

## 2022-06-29 NOTE — Patient Instructions (Signed)

## 2022-06-29 NOTE — Assessment & Plan Note (Signed)
C-Met lipid profile today

## 2022-06-29 NOTE — Assessment & Plan Note (Signed)
Encourage smoking cessation CT lung cancer screening ordered

## 2022-06-30 NOTE — Addendum Note (Signed)
Addended by: Lorre Munroe on: 06/30/2022 07:56 AM   Modules accepted: Orders

## 2022-07-01 LAB — COMPLETE METABOLIC PANEL WITH GFR
AG Ratio: 2 (calc) (ref 1.0–2.5)
ALT: 4 U/L — ABNORMAL LOW (ref 6–29)
AST: 14 U/L (ref 10–35)
Albumin: 4.3 g/dL (ref 3.6–5.1)
Alkaline phosphatase (APISO): 88 U/L (ref 37–153)
BUN: 10 mg/dL (ref 7–25)
CO2: 30 mmol/L (ref 20–32)
Calcium: 9.2 mg/dL (ref 8.6–10.4)
Chloride: 104 mmol/L (ref 98–110)
Creat: 0.61 mg/dL (ref 0.50–1.05)
Globulin: 2.2 g/dL (calc) (ref 1.9–3.7)
Glucose, Bld: 76 mg/dL (ref 65–99)
Potassium: 4.1 mmol/L (ref 3.5–5.3)
Sodium: 141 mmol/L (ref 135–146)
Total Bilirubin: 0.5 mg/dL (ref 0.2–1.2)
Total Protein: 6.5 g/dL (ref 6.1–8.1)
eGFR: 102 mL/min/{1.73_m2} (ref >=60)

## 2022-07-01 LAB — H. PYLORI BREATH TEST: H. pylori Breath Test: NOT DETECTED

## 2022-08-16 ENCOUNTER — Other Ambulatory Visit: Payer: Self-pay

## 2022-08-16 ENCOUNTER — Emergency Department
Admission: EM | Admit: 2022-08-16 | Discharge: 2022-08-16 | Disposition: A | Discharge status: Home / Self Care | Payer: BC Managed Care – PPO | Attending: Student in an Organized Health Care Education/Training Program | Admitting: Student in an Organized Health Care Education/Training Program

## 2022-08-16 DIAGNOSIS — Z20822 Contact with and (suspected) exposure to covid-19: Secondary | ICD-10-CM | POA: Diagnosis not present

## 2022-08-16 DIAGNOSIS — J029 Acute pharyngitis, unspecified: Secondary | ICD-10-CM | POA: Diagnosis not present

## 2022-08-16 DIAGNOSIS — J101 Influenza due to other identified influenza virus with other respiratory manifestations: Secondary | ICD-10-CM | POA: Diagnosis not present

## 2022-08-16 DIAGNOSIS — E876 Hypokalemia: Secondary | ICD-10-CM | POA: Insufficient documentation

## 2022-08-16 DIAGNOSIS — J111 Influenza due to unidentified influenza virus with other respiratory manifestations: Secondary | ICD-10-CM

## 2022-08-16 LAB — TROPONIN I (HIGH SENSITIVITY): Troponin I (High Sensitivity): 4 ng/L (ref <18)

## 2022-08-16 LAB — CBC WITH DIFFERENTIAL/PLATELET
Abs Immature Granulocytes: 0.03 10*3/uL (ref 0.00–0.07)
Basophils Absolute: 0 10*3/uL (ref 0.0–0.1)
Basophils Relative: 0 %
Eosinophils Absolute: 0 10*3/uL (ref 0.0–0.5)
Eosinophils Relative: 0 %
HCT: 45.4 % (ref 36.0–46.0)
Hemoglobin: 14.9 g/dL (ref 12.0–15.0)
Immature Granulocytes: 0 %
Lymphocytes Relative: 13 %
Lymphs Abs: 1.2 10*3/uL (ref 0.7–4.0)
MCH: 28.7 pg (ref 26.0–34.0)
MCHC: 32.8 g/dL (ref 30.0–36.0)
MCV: 87.5 fL (ref 80.0–100.0)
Monocytes Absolute: 1.6 10*3/uL — ABNORMAL HIGH (ref 0.1–1.0)
Monocytes Relative: 18 %
Neutro Abs: 6.2 10*3/uL (ref 1.7–7.7)
Neutrophils Relative %: 69 %
Platelets: 179 10*3/uL (ref 150–400)
RBC: 5.19 MIL/uL — ABNORMAL HIGH (ref 3.87–5.11)
RDW: 12.6 % (ref 11.5–15.5)
WBC: 9.1 10*3/uL (ref 4.0–10.5)
nRBC: 0 % (ref 0.0–0.2)

## 2022-08-16 LAB — BASIC METABOLIC PANEL
Anion gap: 10 (ref 5–15)
BUN: 5 mg/dL — ABNORMAL LOW (ref 8–23)
CO2: 28 mmol/L (ref 22–32)
Calcium: 8.6 mg/dL — ABNORMAL LOW (ref 8.9–10.3)
Chloride: 97 mmol/L — ABNORMAL LOW (ref 98–111)
Creatinine, Ser: 0.69 mg/dL (ref 0.44–1.00)
GFR, Estimated: >60 mL/min (ref >60)
Glucose, Bld: 124 mg/dL — ABNORMAL HIGH (ref 70–99)
Potassium: 3.1 mmol/L — ABNORMAL LOW (ref 3.5–5.1)
Sodium: 135 mmol/L (ref 135–145)

## 2022-08-16 LAB — RESP PANEL BY RT-PCR (RSV, FLU A&B, COVID)  RVPGX2
Influenza A by PCR: POSITIVE — AB
Influenza B by PCR: NEGATIVE
Resp Syncytial Virus by PCR: NEGATIVE
SARS Coronavirus 2 by RT PCR: NEGATIVE

## 2022-08-16 MED ORDER — PSEUDOEPH-BROMPHEN-DM 30-2-10 MG/5ML PO SYRP: 5.0000 mL | ORAL_SOLUTION | Freq: Four times a day (QID) | ORAL | 0 refills | Status: DC | PRN

## 2022-08-16 MED ORDER — NYSTATIN 100000 UNIT/ML MT SUSP: 5.0000 mL | Freq: Four times a day (QID) | OROMUCOSAL | 0 refills | Status: DC | PRN

## 2022-08-16 MED ORDER — POTASSIUM CHLORIDE CRYS ER 20 MEQ PO TBCR
40.0000 meq | EXTENDED_RELEASE_TABLET | Freq: Once | ORAL | Status: AC
  Administered 2022-08-16: 40 meq via ORAL
  Filled 2022-08-16: qty 2

## 2022-08-16 MED ORDER — ACETAMINOPHEN 325 MG PO TABS: 650.0000 mg | ORAL_TABLET | Freq: Once | ORAL | Status: DC

## 2022-08-16 NOTE — Discharge Instructions (Addendum)
Your viral tests is positive for influenza (flu). You have also been given a dose of potassium in the emergency department. Continue to take OTC Tylenol and Motrin as needed. Drink plenty of fluids to prevent dehydration. Follow-up with your provider or return if needed.

## 2022-08-16 NOTE — ED Triage Notes (Signed)
Reports started Friday with n/v/d and now having sore throat, cough body aches and chills.  Complains of chest pain as well as blood sugar dropping.

## 2022-08-16 NOTE — ED Provider Notes (Signed)
[facility] Emergency Department Provider Note     Event Date/Time   First MD Initiated Contact with Patient 08/16/22 1817     (approximate)   History   URI   HPI  Molly Banks is a 61 y.o. female complains of sore throat, chills since Friday. Had n/v/d which have resolved and now she has sore throat and chills. She thinks her blood sugar is low because she has felt weak but did not check her sugar.    Physical Exam   Triage Vital Signs: ED Triage Vitals  Enc Vitals Group     BP 08/16/22 1540 131/72     Pulse Rate 08/16/22 1540 (!) 109     Resp 08/16/22 1540 20     Temp 08/16/22 1540 100 F (37.8 C)     Temp Source 08/16/22 1540 Oral     SpO2 08/16/22 1540 92 %     Weight 08/16/22 1539 123 lb (55.8 kg)     Height 08/16/22 1539 5\' 2"  (1.575 m)     Head Circumference --      Peak Flow --      Pain Score 08/16/22 1539 7     Pain Loc --      Pain Edu? --      Excl. in GC? --     Most recent vital signs: Vitals:   08/16/22 1540  BP: 131/72  Pulse: (!) 109  Resp: 20  Temp: 100 F (37.8 C)  SpO2: 92%    General Awake, no distress. *** {**HEENT NCAT. PERRL. EOMI. No rhinorrhea. Mucous membranes are moist. **} CV:  Good peripheral perfusion. *** RESP:  Normal effort. *** ABD:  No distention. *** {**Other: **}   ED Results / Procedures / Treatments   Labs (all labs ordered are listed, but only abnormal results are displayed) Labs Reviewed  RESP PANEL BY RT-PCR (RSV, FLU A&B, COVID)  RVPGX2 - Abnormal; Notable for the following components:      Result Value   Influenza A by PCR POSITIVE (*)    All other components within normal limits  BASIC METABOLIC PANEL - Abnormal; Notable for the following components:   Potassium 3.1 (*)    Chloride 97 (*)    Glucose, Bld 124 (*)    BUN 5 (*)    Calcium 8.6 (*)    All other components within normal limits  CBC WITH DIFFERENTIAL/PLATELET - Abnormal; Notable for the following components:    RBC 5.19 (*)    Monocytes Absolute 1.6 (*)    All other components within normal limits  TROPONIN I (HIGH SENSITIVITY)     EKG  ***  RADIOLOGY  {**I personally viewed and evaluated these images as part of my medical decision making, as well as reviewing the written report by the radiologist.  ED Provider Interpretation: ***  No results found.   PROCEDURES:  Critical Care performed: {CriticalCareYesNo:19197::"Yes, see critical care procedure note(s)","No"}  Procedures   MEDICATIONS ORDERED IN ED: Medications  potassium chloride SA (KLOR-CON M) CR tablet 40 mEq (has no administration in time range)     IMPRESSION / MDM / ASSESSMENT AND PLAN / ED COURSE  I reviewed the triage vital signs and the nursing notes.                              Differential diagnosis includes, but is not limited to, ***  Patient's presentation  is most consistent with {EM COPA:27473}  {**The patient is on the cardiac monitor to evaluate for evidence of arrhythmia and/or significant heart rate changes.**}  Patient's diagnosis is consistent with ***. Patient will be discharged home with prescriptions for ***. Patient is to follow up with *** as needed or otherwise directed. Patient is given ED precautions to return to the ED for any worsening or new symptoms.     FINAL CLINICAL IMPRESSION(S) / ED DIAGNOSES   Final diagnoses:  None     Rx / DC Orders   ED Discharge Orders     None        Note:  This document was prepared using Dragon voice recognition software and may include unintentional dictation errors.

## 2022-08-16 NOTE — ED Provider Triage Note (Signed)
Emergency Medicine Provider Triage Evaluation Note  CADDIE RANDLE , a 61 y.o. female  was evaluated in triage.  Pt complains of sore throat, chills since Friday. Had n/v/d which have resolved and now she has sore throat and chills. She thinks her blood sugar is low because she has felt weak but did not check her sugar.  Review of Systems  Positive: Chills, sore throat, cough Negative: CP/SOB  Physical Exam  There were no vitals taken for this visit. Gen:   Awake, no distress   Resp:  Normal effort  MSK:   Moves extremities without difficulty  Other:    Medical Decision Making  Medically screening exam initiated at 3:38 PM.  Appropriate orders placed.  JEARLENE BRIDWELL was informed that the remainder of the evaluation will be completed by another provider, this initial triage assessment does not replace that evaluation, and the importance of remaining in the ED until their evaluation is complete.     Jackelyn Hoehn, PA-C 08/16/22 1540

## 2022-09-07 ENCOUNTER — Ambulatory Visit
Admission: RE | Admit: 2022-09-07 | Discharge: 2022-09-07 | Disposition: A | Discharge status: Home / Self Care | Payer: BC Managed Care – PPO | Source: Ambulatory Visit | Attending: Internal Medicine | Admitting: Internal Medicine

## 2022-09-07 DIAGNOSIS — Z780 Asymptomatic menopausal state: Secondary | ICD-10-CM | POA: Diagnosis not present

## 2022-09-07 DIAGNOSIS — M8589 Other specified disorders of bone density and structure, multiple sites: Secondary | ICD-10-CM | POA: Diagnosis not present

## 2022-09-08 ENCOUNTER — Encounter: Payer: Self-pay | Admitting: Family Medicine

## 2022-09-08 ENCOUNTER — Ambulatory Visit: Payer: BC Managed Care – PPO | Admitting: Family Medicine

## 2022-09-08 VITALS — BP 130/82 | HR 111 | Ht 62.0 in | Wt 121.6 lb

## 2022-09-08 DIAGNOSIS — H66001 Acute suppurative otitis media without spontaneous rupture of ear drum, right ear: Secondary | ICD-10-CM | POA: Diagnosis not present

## 2022-09-08 DIAGNOSIS — J9801 Acute bronchospasm: Secondary | ICD-10-CM

## 2022-09-08 DIAGNOSIS — J0110 Acute frontal sinusitis, unspecified: Secondary | ICD-10-CM | POA: Diagnosis not present

## 2022-09-08 MED ORDER — AMOXICILLIN-POT CLAVULANATE 875-125 MG PO TABS: 1.0000 | ORAL_TABLET | Freq: Two times a day (BID) | ORAL | 0 refills | Status: DC

## 2022-09-08 MED ORDER — PREDNISONE 20 MG PO TABS: ORAL_TABLET | ORAL | 0 refills | Status: DC

## 2022-09-08 MED ORDER — ALBUTEROL SULFATE HFA 108 (90 BASE) MCG/ACT IN AERS: 2.0000 | INHALATION_SPRAY | Freq: Four times a day (QID) | RESPIRATORY_TRACT | 0 refills | Status: DC | PRN

## 2022-09-08 NOTE — Progress Notes (Signed)
Subjective:    Patient ID: [name], female    DOB: 1960-10-03, 62 y.o.   MRN: [id number]  Molly Banks is a 62 y.o. female presenting on 09/08/2022 for Cough  Patient presents for a same day appointment.  PCP Webb Silversmith, FNP   HPI  Post Influenza Syndrome Pharyngitis Sinusitis Ear Pain Pressure  ED visit 08/16/22 found to have Influenza A positive on test, other viral tests were negative. She was discharged with cough syrup. Not on flu medication or anti viral.  She has improved from stand point of Flu but still has persistent sinus pressure drainage.  She now has worsening of symptoms with productive cough, ear pressure pain, sinus pressure  Admits R ear pain pressure.  Using cough syrup bromfed from ED  Denies fever chills nausea vomiting diarrhea.      06/29/2022    8:40 AM 06/24/2021    2:55 PM 01/16/2019    2:08 PM  Depression screen PHQ 2/9  Decreased Interest 0 0 0  Down, Depressed, Hopeless 0 0 0  PHQ - 2 Score 0 0 0    Social History   Tobacco Use   Smoking status: Every Day    Packs/day: 0.50    Years: 30.00    Total pack years: 15.00    Types: Cigarettes   Smokeless tobacco: Former   Tobacco comments:    14, 21s  Vaping Use   Vaping Use: Former   Substances: Nicotine, Flavoring  Substance Use Topics   Alcohol use: No   Drug use: No    Review of Systems Per HPI unless specifically indicated above     Objective:    BP 130/82   Pulse (!) 111   Ht 5\' 2"  (1.575 m)   Wt 121 lb 9.6 oz (55.2 kg)   SpO2 99%   BMI 22.24 kg/m   Wt Readings from Last 3 Encounters:  09/08/22 121 lb 9.6 oz (55.2 kg)  08/16/22 123 lb (55.8 kg)  06/29/22 123 lb (55.8 kg)    Physical Exam Vitals and nursing note reviewed.  Constitutional:      General: She is not in acute distress.    Appearance: Normal appearance. She is well-developed. She is not diaphoretic.     Comments: Well-appearing, comfortable, cooperative  HENT:     Head: Normocephalic and  atraumatic.     Right Ear: Ear canal and external ear normal. There is no impacted cerumen.     Left Ear: Ear canal and external ear normal. There is no impacted cerumen.     Ears:     Comments: R TM erythema bulging  L TM clear but some fullness Eyes:     General:        Right eye: No discharge.        Left eye: No discharge.     Conjunctiva/sclera: Conjunctivae normal.  Cardiovascular:     Rate and Rhythm: Normal rate.  Pulmonary:     Effort: Pulmonary effort is normal.     Breath sounds: Wheezing present. No rhonchi.  Skin:    General: Skin is warm and dry.     Findings: No erythema or rash.  Neurological:     Mental Status: She is alert and oriented to person, place, and time.  Psychiatric:        Mood and Affect: Mood normal.        Behavior: Behavior normal.        Thought Content: Thought content  normal.     Comments: Well groomed, good eye contact, normal speech and thoughts    Results for orders placed or performed during the hospital encounter of 08/16/22  Resp panel by RT-PCR (RSV, Flu A&B, Covid) Anterior Nasal Swab   Specimen: Anterior Nasal Swab  Result Value Ref Range   SARS Coronavirus 2 by RT PCR NEGATIVE NEGATIVE   Influenza A by PCR POSITIVE (A) NEGATIVE   Influenza B by PCR NEGATIVE NEGATIVE   Resp Syncytial Virus by PCR NEGATIVE NEGATIVE  Basic metabolic panel  Result Value Ref Range   Sodium 135 135 - 145 mmol/L   Potassium 3.1 (L) 3.5 - 5.1 mmol/L   Chloride 97 (L) 98 - 111 mmol/L   CO2 28 22 - 32 mmol/L   Glucose, Bld 124 (H) 70 - 99 mg/dL   BUN 5 (L) 8 - 23 mg/dL   Creatinine, Ser 0.69 0.44 - 1.00 mg/dL   Calcium 8.6 (L) 8.9 - 10.3 mg/dL   GFR, Estimated >60 >60 mL/min   Anion gap 10 5 - 15  CBC with Differential  Result Value Ref Range   WBC 9.1 4.0 - 10.5 K/uL   RBC 5.19 (H) 3.87 - 5.11 MIL/uL   Hemoglobin 14.9 12.0 - 15.0 g/dL   HCT 45.4 36.0 - 46.0 %   MCV 87.5 80.0 - 100.0 fL   MCH 28.7 26.0 - 34.0 pg   MCHC 32.8 30.0 - 36.0 g/dL    RDW 12.6 11.5 - 15.5 %   Platelets 179 150 - 400 K/uL   nRBC 0.0 0.0 - 0.2 %   Neutrophils Relative % 69 %   Neutro Abs 6.2 1.7 - 7.7 K/uL   Lymphocytes Relative 13 %   Lymphs Abs 1.2 0.7 - 4.0 K/uL   Monocytes Relative 18 %   Monocytes Absolute 1.6 (H) 0.1 - 1.0 K/uL   Eosinophils Relative 0 %   Eosinophils Absolute 0.0 0.0 - 0.5 K/uL   Basophils Relative 0 %   Basophils Absolute 0.0 0.0 - 0.1 K/uL   Immature Granulocytes 0 %   Abs Immature Granulocytes 0.03 0.00 - 0.07 K/uL  Troponin I (High Sensitivity)  Result Value Ref Range   Troponin I (High Sensitivity) 4 <18 ng/L      Assessment & Plan:   Problem List Items Addressed This Visit   None Visit Diagnoses     Non-recurrent acute suppurative otitis media of right ear without spontaneous rupture of tympanic membrane    -  Primary   Relevant Medications   amoxicillin-clavulanate (AUGMENTIN) 875-125 MG tablet   predniSONE (DELTASONE) 20 MG tablet   Bronchospasm, acute       Relevant Medications   predniSONE (DELTASONE) 20 MG tablet   albuterol (VENTOLIN HFA) 108 (90 Base) MCG/ACT inhaler   Acute non-recurrent frontal sinusitis       Relevant Medications   amoxicillin-clavulanate (AUGMENTIN) 875-125 MG tablet   predniSONE (DELTASONE) 20 MG tablet       Ear infection R side Sinus infection as well likely Ear pressure with fluid bulging behind bilateral TM  Start antibiotic Augmentin twice a day for 10 days Start Prednisone taper for 7 days, for underlying bronchospasm w/ presence of COPD  Albuterol rescue inhaler as needed for cough  Use current cough syrup as needed  Start nasal steroid Flonase 2 sprays in each nostril daily for 4-6 weeks, may repeat course seasonally or as needed  Meds ordered this encounter  Medications   amoxicillin-clavulanate (AUGMENTIN)  875-125 MG tablet    Sig: Take 1 tablet by mouth 2 (two) times daily.    Dispense:  20 tablet    Refill:  0   predniSONE (DELTASONE) 20 MG tablet     Sig: Take daily with food. Start with 60mg  (3 pills) x 2 days, then reduce to 40mg  (2 pills) x 2 days, then 20mg  (1 pill) x 3 days    Dispense:  13 tablet    Refill:  0   albuterol (VENTOLIN HFA) 108 (90 Base) MCG/ACT inhaler    Sig: Inhale 2 puffs into the lungs every 6 (six) hours as needed for wheezing or shortness of breath.    Dispense:  8 g    Refill:  0      Follow up plan: Return if symptoms worsen or fail to improve.    , DO [facility] 09/08/2022, 11:03 AM

## 2022-09-08 NOTE — Patient Instructions (Addendum)
Thank you for coming to the office today.  Ear infection R side Sinus infection as well likely Ear pressure with fluid  Start antibiotic Augmentin twice a day for 10 days Start Prednisone taper for 7 days  Albuterol rescue inhaler as needed for cough  Start nasal steroid Flonase 2 sprays in each nostril daily for 4-6 weeks, may repeat course seasonally or as needed   Please schedule a Follow-up Appointment to: Return if symptoms worsen or fail to improve.  If you have any other questions or concerns, please feel free to call the office or send a message through [place]. You may also schedule an earlier appointment if necessary.  Additionally, you may be receiving a survey about your experience at our office within a few days to 1 week by e-mail or mail. We value your feedback.  Nobie Putnam, DO [place]

## 2022-09-15 ENCOUNTER — Ambulatory Visit: Payer: Self-pay | Admitting: *Deleted

## 2022-09-15 NOTE — Telephone Encounter (Signed)
Reason for Disposition  [1] Shingles rash (matches SYMPTOMS) AND [2] onset < 72 hours ago (3 days)  Answer Assessment - Initial Assessment Questions 1. APPEARANCE of RASH: "Describe the rash."      Blisters, rash, painful 2. LOCATION: "Where is the rash located?"      Left leg- hip area 3. NUMBER: "How many spots are there?"      Size if silver dollar 4. SIZE: "How big are the spots?" (Inches, centimeters or compare to size of a coin)      1 area 5. ONSET: "When did the rash start?"      Started 2 days 6. ITCHING: "Does the rash itch?" If Yes, ask: "How bad is the itch?"  (Scale 0-10; or none, mild, moderate, severe)     yes 7. PAIN: "Does the rash hurt?" If Yes, ask: "How bad is the pain?"  (Scale 0-10; or none, mild, moderate, severe)    - NONE (0): no pain    - MILD (1-3): doesn't interfere with normal activities     - MODERATE (4-7): interferes with normal activities or awakens from sleep     - SEVERE (8-10): excruciating pain, unable to do any normal activities     Yes- moderate 8. OTHER SYMPTOMS: "Do you have any other symptoms?" (e.g., fever)     fever  Protocols used: Rash or Redness - Localized-A-AH, Shingles (Zoster)-A-AH

## 2022-09-15 NOTE — Telephone Encounter (Signed)
  Chief Complaint: painful blisters on leg Symptoms: painful blisters on leg, itching,warm to touch Frequency: started 2 days ago Pertinent Negatives: Patient denies fever Disposition: [] ED /[] Urgent Care (no appt availability in office) / [x] Appointment(In office/virtual)/ []  [facility]/ [] Home Care/ [] Refused Recommended Disposition /[] [place] [facility]/ []  Follow-up with PCP Additional Notes: Possible shingles- appointment scheduled

## 2022-09-16 ENCOUNTER — Encounter: Payer: Self-pay | Admitting: Family Medicine

## 2022-09-16 ENCOUNTER — Ambulatory Visit: Payer: BC Managed Care – PPO | Admitting: Family Medicine

## 2022-09-16 VITALS — BP 102/60 | HR 98 | Ht 62.0 in | Wt 122.8 lb

## 2022-09-16 DIAGNOSIS — B029 Zoster without complications: Secondary | ICD-10-CM

## 2022-09-16 DIAGNOSIS — L089 Local infection of the skin and subcutaneous tissue, unspecified: Secondary | ICD-10-CM

## 2022-09-16 MED ORDER — VALACYCLOVIR HCL 1 G PO TABS: 1000.0000 mg | ORAL_TABLET | Freq: Three times a day (TID) | ORAL | 0 refills | Status: DC

## 2022-09-16 MED ORDER — MUPIROCIN 2 % EX OINT: 1.0000 | TOPICAL_OINTMENT | Freq: Two times a day (BID) | CUTANEOUS | 0 refills | Status: DC

## 2022-09-16 MED ORDER — AMOXICILLIN-POT CLAVULANATE 875-125 MG PO TABS: 1.0000 | ORAL_TABLET | Freq: Two times a day (BID) | ORAL | 0 refills | Status: DC

## 2022-09-16 NOTE — Patient Instructions (Addendum)
Thank you for coming to the office today.  Could be shingles rash based on what the appearance is.  Also it does show some redness warmth I am worried about topical cellulitis skin infection.  Continue the Antibiotic, added 5 more days.  Add topical antibiotic Mupirocin to protect the skin  Added Shingles medication anti viral 3 times a day for 7 days  If spreading redness or drainage of pus or fever chills - let me know we may need to [facility] antibiotic.  Please schedule a Follow-up Appointment to: Return if symptoms worsen or fail to improve.  If you have any other questions or concerns, please feel free to call the office or send a message through [employer]. You may also schedule an earlier appointment if necessary.  Additionally, you may be receiving a survey about your experience at our office within a few days to 1 week by e-mail or mail. We value your feedback.  Nobie Putnam, DO [place]

## 2022-09-16 NOTE — Progress Notes (Signed)
Subjective:    Patient ID: Molly Banks, female    DOB: 06/30/1961, 62 y.o.   MRN: [id number]  Molly Banks is a 62 y.o. female presenting on 09/16/2022 for Rash and Blister  Patient presents for a same day appointment.  HPI  Shingles acute flare, Left Leg vs Cellulitis Recent new onset problem past 1 week with localized blister on Left lower leg initially. Initial some sharper burning pain and then rash developed, now some crusting vesicular lesions Worse with movement and walking Already on Augmentin antibiotic from recent sinusitis Denies fever chills spreading redness     06/29/2022    8:40 AM 06/24/2021    2:55 PM 01/16/2019    2:08 PM  Depression screen PHQ 2/9  Decreased Interest 0 0 0  Down, Depressed, Hopeless 0 0 0  PHQ - 2 Score 0 0 0    Social History   Tobacco Use   Smoking status: Every Day    Packs/day: 0.50    Years: 30.00    Total pack years: 15.00    Types: Cigarettes   Smokeless tobacco: Former   Tobacco comments:    14, 45s  Vaping Use   Vaping Use: Former   Substances: Nicotine, Flavoring  Substance Use Topics   Alcohol use: No   Drug use: No    Review of Systems Per HPI unless specifically indicated above     Objective:    BP 102/60   Pulse 98   Ht 5\' 2"  (1.575 m)   Wt 122 lb 12.8 oz (55.7 kg)   SpO2 92%   BMI 22.46 kg/m   Wt Readings from Last 3 Encounters:  09/16/22 122 lb 12.8 oz (55.7 kg)  09/08/22 121 lb 9.6 oz (55.2 kg)  08/16/22 123 lb (55.8 kg)    Physical Exam Vitals and nursing note reviewed.  Constitutional:      General: She is not in acute distress.    Appearance: Normal appearance. She is well-developed. She is not diaphoretic.     Comments: Well-appearing, comfortable, cooperative  HENT:     Head: Normocephalic and atraumatic.  Eyes:     General:        Right eye: No discharge.        Left eye: No discharge.     Conjunctiva/sclera: Conjunctivae normal.  Cardiovascular:     Rate and Rhythm: Normal rate.   Pulmonary:     Effort: Pulmonary effort is normal.  Skin:    General: Skin is warm and dry.     Findings: Lesion (see photo, erythematous slightly raised patch with some pustular vesicle/crusts) present. No erythema or rash.  Neurological:     Mental Status: She is alert and oriented to person, place, and time.  Psychiatric:        Mood and Affect: Mood normal.        Behavior: Behavior normal.        Thought Content: Thought content normal.     Comments: Well groomed, good eye contact, normal speech and thoughts     Left lower leg posterior thigh  Results for orders placed or performed during the hospital encounter of 08/16/22  Resp panel by RT-PCR (RSV, Flu A&B, Covid) Anterior Nasal Swab   Specimen: Anterior Nasal Swab  Result Value Ref Range   SARS Coronavirus 2 by RT PCR NEGATIVE NEGATIVE   Influenza A by PCR POSITIVE (A) NEGATIVE   Influenza B by PCR NEGATIVE NEGATIVE   Resp Syncytial Virus  by PCR NEGATIVE NEGATIVE  Basic metabolic panel  Result Value Ref Range   Sodium 135 135 - 145 mmol/L   Potassium 3.1 (L) 3.5 - 5.1 mmol/L   Chloride 97 (L) 98 - 111 mmol/L   CO2 28 22 - 32 mmol/L   Glucose, Bld 124 (H) 70 - 99 mg/dL   BUN 5 (L) 8 - 23 mg/dL   Creatinine, Ser 0.69 0.44 - 1.00 mg/dL   Calcium 8.6 (L) 8.9 - 10.3 mg/dL   GFR, Estimated >60 >60 mL/min   Anion gap 10 5 - 15  CBC with Differential  Result Value Ref Range   WBC 9.1 4.0 - 10.5 K/uL   RBC 5.19 (H) 3.87 - 5.11 MIL/uL   Hemoglobin 14.9 12.0 - 15.0 g/dL   HCT 45.4 36.0 - 46.0 %   MCV 87.5 80.0 - 100.0 fL   MCH 28.7 26.0 - 34.0 pg   MCHC 32.8 30.0 - 36.0 g/dL   RDW 12.6 11.5 - 15.5 %   Platelets 179 150 - 400 K/uL   nRBC 0.0 0.0 - 0.2 %   Neutrophils Relative % 69 %   Neutro Abs 6.2 1.7 - 7.7 K/uL   Lymphocytes Relative 13 %   Lymphs Abs 1.2 0.7 - 4.0 K/uL   Monocytes Relative 18 %   Monocytes Absolute 1.6 (H) 0.1 - 1.0 K/uL   Eosinophils Relative 0 %   Eosinophils Absolute 0.0 0.0 - 0.5 K/uL    Basophils Relative 0 %   Basophils Absolute 0.0 0.0 - 0.1 K/uL   Immature Granulocytes 0 %   Abs Immature Granulocytes 0.03 0.00 - 0.07 K/uL  Troponin I (High Sensitivity)  Result Value Ref Range   Troponin I (High Sensitivity) 4 <18 ng/L      Assessment & Plan:   Problem List Items Addressed This Visit   None Visit Diagnoses     Herpes zoster without complication    -  Primary   Relevant Medications   mupirocin ointment (BACTROBAN) 2 %   valACYclovir (VALTREX) 1000 MG tablet   Skin pustule       Relevant Medications   amoxicillin-clavulanate (AUGMENTIN) 875-125 MG tablet   mupirocin ointment (BACTROBAN) 2 %       Could be shingles rash based on what the appearance is.  Also it does show some redness warmth I am worried about topical cellulitis skin infection.  Continue the Antibiotic, added 5 more days.  Add topical antibiotic Mupirocin to protect the skin  Added Shingles medication anti viral 3 times a day for 7 days  If spreading redness or drainage of pus or fever chills - let me know we may need to [facility] antibiotic.  Meds ordered this encounter  Medications   amoxicillin-clavulanate (AUGMENTIN) 875-125 MG tablet    Sig: Take 1 tablet by mouth 2 (two) times daily. Extension to 5 more days    Dispense:  10 tablet    Refill:  0   mupirocin ointment (BACTROBAN) 2 %    Sig: Apply 1 Application topically 2 (two) times daily. For 1-2 weeks or until resolved    Dispense:  22 g    Refill:  0   valACYclovir (VALTREX) 1000 MG tablet    Sig: Take 1 tablet (1,000 mg total) by mouth 3 (three) times daily. For shingles    Dispense:  21 tablet    Refill:  0      Follow up plan: Return if symptoms worsen or fail to  improve.  Saralyn Pilar, DO [facility] 09/16/2022, 3:45 PM

## 2022-11-11 IMAGING — MR MR CARD MORPHOLOGY WO/W CM
45 of 48 series · 45 of 48 positions shown · IV contrast (Contrast agent)
Comparison: none

CLINICAL DATA: Clinical question of hypertrophic cardiomyopathy, LV
non-compaction
Study assumes HCT of 43.6  And BSA of 1.5 m2.

EXAM:
CARDIAC MRI
TECHNIQUE: The patient was scanned on a 1.5 Tesla GE magnet. A dedicated
cardiac coil was used. Functional imaging was done using Fiesta
sequences. [DATE], and 4 chamber views were done to assess for RWMA's.
Modified Orcica rule using a short axis stack was used to
calculate an ejection fraction on a dedicated work station using
Circle software. The patient received 7 cc of Gadavist. After 10
minutes inversion recovery sequences were used to assess for
infiltration and scar tissue.
CONTRAST:  7 cc  of Gadavist

[Series 4: [id number] · axial · 8.0mm · 1.41mm/px · 1 of 16 slices shown]
[im 1/16]
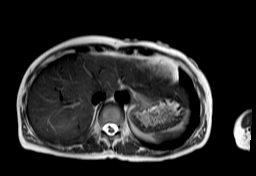

[Series 8: bSSFP · oblique · 8.0mm · 1.61mm/px · 1 of 25 slices shown (1 of 20)]
[im 1/25]
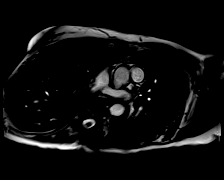

[Series 9: bSSFP · oblique · 8.0mm · 1.61mm/px · 1 of 25 slices shown (2 of 20)]
[im 1/25]
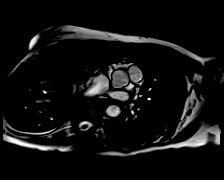

[Series 10: bSSFP · oblique · 8.0mm · 1.61mm/px · 1 of 25 slices shown (3 of 20)]
[im 1/25]
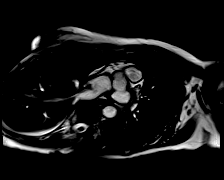

[Series 11: bSSFP · oblique · 8.0mm · 1.61mm/px · 1 of 25 slices shown (4 of 20)]
[im 1/25]
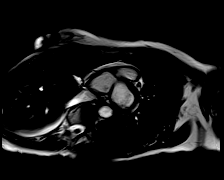

[Series 12: bSSFP · oblique · 8.0mm · 1.61mm/px · 1 of 25 slices shown (5 of 20)]
[im 1/25]
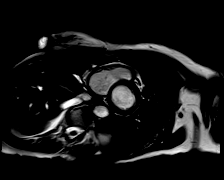

[Series 13: bSSFP · oblique · 8.0mm · 1.61mm/px · 1 of 25 slices shown (6 of 20)]
[im 1/25]
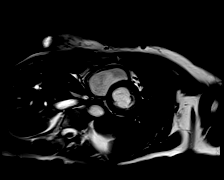

[Series 14: bSSFP · oblique · 8.0mm · 1.61mm/px · 1 of 25 slices shown (7 of 20)]
[im 1/25]
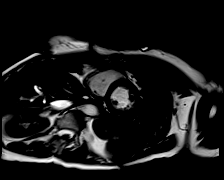

[Series 15: bSSFP · oblique · 8.0mm · 1.61mm/px · 1 of 25 slices shown (8 of 20)]
[im 1/25]
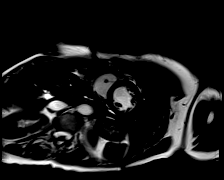

[Series 16: bSSFP · oblique · 8.0mm · 1.61mm/px · 1 of 25 slices shown (9 of 20)]
[im 1/25]
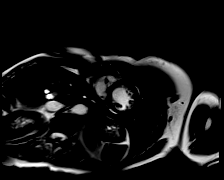

[Series 17: bSSFP · oblique · 8.0mm · 1.61mm/px · 1 of 25 slices shown (10 of 20)]
[im 1/25]
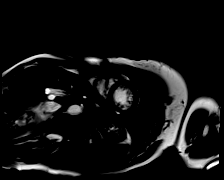

[Series 18: bSSFP · oblique · 8.0mm · 1.61mm/px · 1 of 25 slices shown (11 of 20)]
[im 1/25]
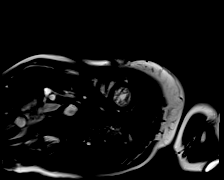

[Series 19: bSSFP · oblique · 8.0mm · 1.61mm/px · 1 of 25 slices shown (12 of 20)]
[im 1/25]
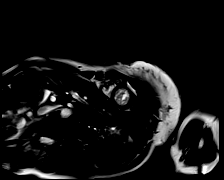

[Series 20: bSSFP · oblique · 8.0mm · 1.61mm/px · 1 of 25 slices shown (13 of 20)]
[im 1/25]
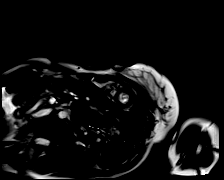

[Series 21: bSSFP · oblique · 8.0mm · 1.61mm/px · 1 of 25 slices shown (14 of 20)]
[im 1/25]
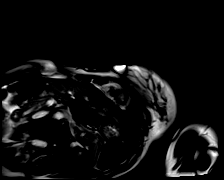

[Series 22: bSSFP · oblique · 8.0mm · 1.61mm/px · 1 of 25 slices shown (15 of 20)]
[im 1/25]
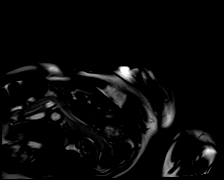

[Series 23: bSSFP · oblique · 8.0mm · 1.61mm/px · 1 of 25 slices shown (16 of 20)]
[im 1/25]
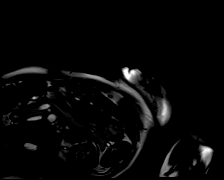

[Series 24: bSSFP · oblique · 6.0mm · 1.41mm/px · 1 of 25 slices shown (17 of 20)]
[im 1/25]
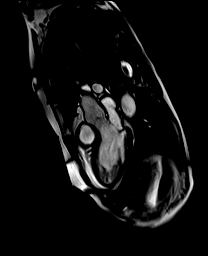

[Series 27: bSSFP · oblique · 6.0mm · 1.41mm/px · 1 of 25 slices shown (18 of 20)]
[im 1/25]
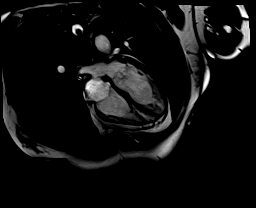

[Series 29: (id)_long_t1 · oblique · 8.0mm · 1.56mm/px · 1 of 24 slices shown]
[im 1/24]
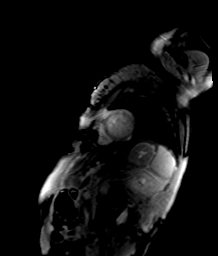

[Series 30: (id)_long_t1_moco · oblique · 8.0mm · 1.56mm/px · 1 of 24 slices shown]
[im 1/24]
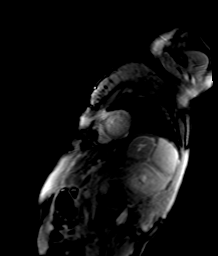

[Series 31: (id)_long_t1_moco_t1 · oblique · 8.0mm · 1.56mm/px · 1 of 6 slices shown]
[im 1/6]
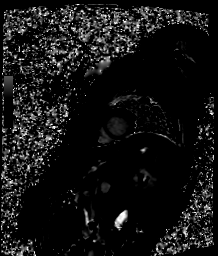

[Series 33: bSSFP · oblique · 6.0mm · 1.41mm/px · 1 of 25 slices shown (19 of 20)]
[im 1/25]
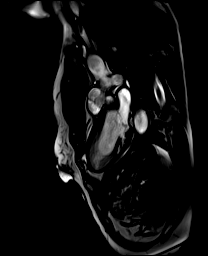

[Series 37: (id)_trufi · oblique · 8.0mm · 2.08mm/px · 1 of 9 slices shown]
[im 1/9]
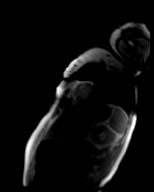

[Series 38: (id)_trufi_moco · oblique · 8.0mm · 2.08mm/px · 1 of 9 slices shown]
[im 1/9]
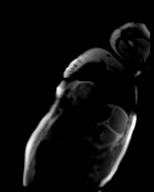

[Series 39: (id)_trufi_moco_t2 · oblique · 8.0mm · 2.08mm/px · 1 of 3 slices shown]
[im 1/3]
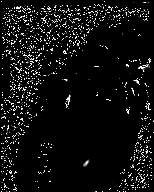

[Series 41: pre short axis · oblique · non-contrast · 8.0mm · 2.25mm/px · 1 of 10 slices shown (1 of 6)]
[im 1/10]
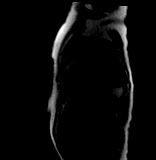

[Series 42: pre short axis · oblique · non-contrast · 8.0mm · 2.25mm/px · 1 of 10 slices shown (2 of 6)]
[im 1/10]
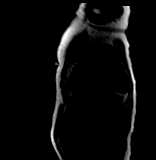

[Series 43: pre short axis · oblique · non-contrast · 8.0mm · 2.25mm/px · 1 of 10 slices shown (3 of 6)]
[im 1/10]
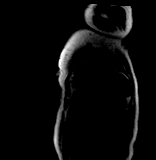

[Series 44: pre short axis · oblique · non-contrast · 8.0mm · 2.25mm/px · 1 of 10 slices shown (4 of 6)]
[im 1/10]
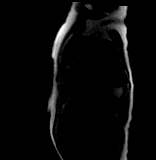

[Series 45: pre short axis · oblique · non-contrast · 8.0mm · 2.25mm/px · 1 of 10 slices shown (5 of 6)]
[im 1/10]
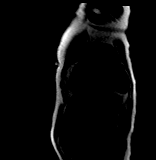

[Series 46: pre short axis · oblique · non-contrast · 8.0mm · 2.25mm/px · 1 of 10 slices shown (6 of 6)]
[im 1/10]
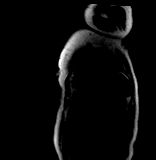

[Series 47: rest short axis · oblique · 8.0mm · 2.25mm/px · 1 of 60 slices shown (1 of 6)]
[im 1/60]
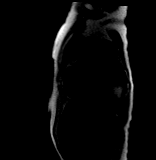

[Series 48: rest short axis · oblique · 8.0mm · 2.25mm/px · 1 of 60 slices shown (2 of 6)]
[im 1/60]
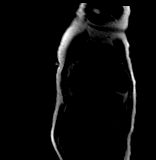

[Series 49: rest short axis · oblique · 8.0mm · 2.25mm/px · 1 of 60 slices shown (3 of 6)]
[im 1/60]
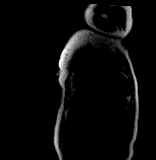

[Series 50: rest short axis · oblique · 8.0mm · 2.25mm/px · 1 of 60 slices shown (4 of 6)]
[im 1/60]
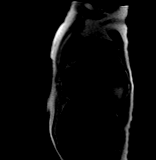

[Series 51: rest short axis · oblique · 8.0mm · 2.25mm/px · 1 of 60 slices shown (5 of 6)]
[im 1/60]
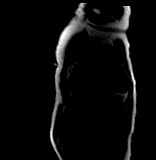

[Series 52: rest short axis · oblique · 8.0mm · 2.25mm/px · 1 of 60 slices shown (6 of 6)]
[im 1/60]
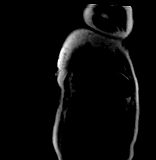

[Series 53: bSSFP · coronal · 6.0mm · 1.41mm/px · 1 of 25 slices shown (20 of 20)]
[im 1/25]
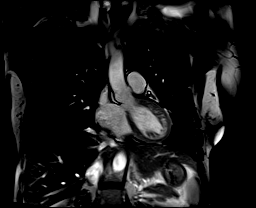

[Series 54: aortic valve cine · oblique · 6.0mm · 1.41mm/px · 1 of 25 slices shown]
[im 1/25]
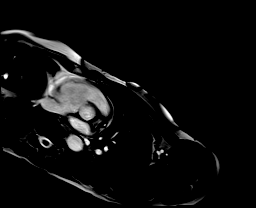

[Series 55: cine rvit · oblique · 6.0mm · 1.41mm/px · 1 of 25 slices shown]
[im 1/25]
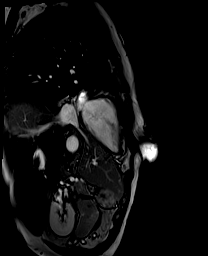

[Series 56: cine rvot · sagittal · 6.0mm · 1.41mm/px · 1 of 25 slices shown]
[im 1/25]
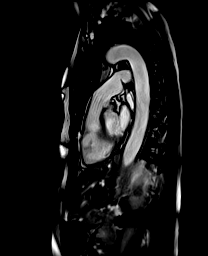

[Series 59: lge_single shot sa · oblique · 8.0mm · 2.08mm/px · 1 of 18 slices shown (1 of 2)]
[im 1/18]
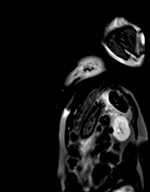

[Series 60: lge_single shot sa · oblique · 8.0mm · 2.08mm/px · 1 of 18 slices shown (2 of 2)]
[im 1/18]
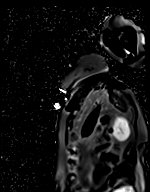

[Series 67: (id)_short_t1 · oblique · 8.0mm · 1.56mm/px · 1 of 27 slices shown]
[im 1/27]
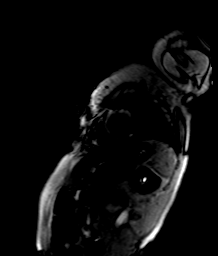

[45 of 48 positions shown; findings below may reference images not displayed]

FINDINGS: 1. Normal left ventricular size, with LVEDD 42 mm, and LVEDVi 59
mL/m2.

Normal left ventricular thickness, with intraventricular septal
thickness of 7 mm, posterior wall thickness of 3 mm, and myocardial
mass index of 39 g/m2.

Normal left ventricular systolic function (LVEF =59%). There are no
regional wall motion abnormalities.

Left ventricular parametric mapping notable for normal T2 signal,
mild increase in the apical septum and apical inferior (35%).

There is no late gadolinium enhancement in the left ventricular
myocardium.

Non-trabeculated myocardium 58 g

Trabeculated myocardium 10 g

Trabeculated ventricular mass < 25% of global LV mass

Non compacted mass < 15 g/m2

[HOSPITAL]/C ratio < 3 in segments 1-3, 7-16

[HOSPITAL]/C ratio < 2 in segments 4-6.

Erxleben criteria for LV[HOSPITAL] is not met

2. Normal right ventricular size with RVEDVI 55 mL/m2.

Normal right ventricular thickness.

Normal right ventricular systolic function (RVEF =53%). There are no
regional wall motion abnormalities or aneurysms.

3.  Normal left and right atrial size.

4. Normal size of the aortic root, ascending aorta and pulmonary
artery.

5. Valve assessment:

Aortic Valve: Tri-leaflet aortic valve. Qualitatively no significant
regurgitation.

Pulmonic Valve: Qualitatively no significant regurgitation.

Tricuspid Valve: Mild anterior leaflet prolapse .Qualitatively no
significant regurgitation.

Mitral Valve: Anterior leaflet prolapse. Slight Sanchez stick
appearance of anterior leaflet. Mild mitral regurgitation.

6.  Normal pericardium.  No pericardial effusion.

7. Grossly, no extracardiac findings. Recommended dedicated study if
concerned for non-cardiac pathology.
IMPRESSION: Study is not consistent with hypertrophic cardiomyopathy.

Class criteria for LV[HOSPITAL] is not met.

## 2022-12-29 ENCOUNTER — Ambulatory Visit: Payer: BC Managed Care – PPO | Admitting: Internal Medicine

## 2023-01-04 ENCOUNTER — Encounter: Payer: Self-pay | Admitting: Internal Medicine

## 2023-01-04 ENCOUNTER — Ambulatory Visit: Payer: BC Managed Care – PPO | Admitting: Internal Medicine

## 2023-01-04 VITALS — BP 120/84 | HR 68 | Temp 96.2°F | Wt 121.0 lb

## 2023-01-04 DIAGNOSIS — J410 Simple chronic bronchitis: Secondary | ICD-10-CM

## 2023-01-04 DIAGNOSIS — D751 Secondary polycythemia: Secondary | ICD-10-CM

## 2023-01-04 DIAGNOSIS — R7309 Other abnormal glucose: Secondary | ICD-10-CM

## 2023-01-04 DIAGNOSIS — I700 Atherosclerosis of aorta: Secondary | ICD-10-CM | POA: Diagnosis not present

## 2023-01-04 DIAGNOSIS — E7800 Pure hypercholesterolemia, unspecified: Secondary | ICD-10-CM

## 2023-01-04 DIAGNOSIS — J9801 Acute bronchospasm: Secondary | ICD-10-CM

## 2023-01-04 DIAGNOSIS — M8589 Other specified disorders of bone density and structure, multiple sites: Secondary | ICD-10-CM

## 2023-01-04 DIAGNOSIS — G2581 Restless legs syndrome: Secondary | ICD-10-CM

## 2023-01-04 DIAGNOSIS — F5101 Primary insomnia: Secondary | ICD-10-CM

## 2023-01-04 DIAGNOSIS — N3281 Overactive bladder: Secondary | ICD-10-CM

## 2023-01-04 DIAGNOSIS — F419 Anxiety disorder, unspecified: Secondary | ICD-10-CM | POA: Insufficient documentation

## 2023-01-04 DIAGNOSIS — R519 Headache, unspecified: Secondary | ICD-10-CM

## 2023-01-04 DIAGNOSIS — K219 Gastro-esophageal reflux disease without esophagitis: Secondary | ICD-10-CM

## 2023-01-04 DIAGNOSIS — F32A Depression, unspecified: Secondary | ICD-10-CM

## 2023-01-04 MED ORDER — ALBUTEROL SULFATE HFA 108 (90 BASE) MCG/ACT IN AERS: 2.0000 | INHALATION_SPRAY | Freq: Four times a day (QID) | RESPIRATORY_TRACT | 0 refills | Status: AC | PRN

## 2023-01-04 MED ORDER — TRELEGY ELLIPTA 100-62.5-25 MCG/ACT IN AEPB: 1.0000 | INHALATION_SPRAY | Freq: Every day | RESPIRATORY_TRACT | 1 refills | Status: DC

## 2023-01-04 MED ORDER — BUSPIRONE HCL 10 MG PO TABS: 10.0000 mg | ORAL_TABLET | Freq: Two times a day (BID) | ORAL | 1 refills | Status: DC

## 2023-01-04 MED ORDER — ASPIRIN 81 MG PO TBEC: 81.0000 mg | DELAYED_RELEASE_TABLET | Freq: Every day | ORAL | 12 refills | Status: AC

## 2023-01-04 NOTE — Assessment & Plan Note (Signed)
Encourage smoking cessation Will start Trelegy Continue albuterol as needed

## 2023-01-04 NOTE — Assessment & Plan Note (Signed)
Continue ibuprofen as needed. 

## 2023-01-04 NOTE — Assessment & Plan Note (Signed)
Will trial buspirone 10 mg twice daily Support offered

## 2023-01-04 NOTE — Assessment & Plan Note (Signed)
C-Met and lipid profile today Encouraged her to consume low-fat diet We will have her start baby aspirin daily

## 2023-01-04 NOTE — Assessment & Plan Note (Signed)
Encourage smoking cessation CBC today 

## 2023-01-04 NOTE — Assessment & Plan Note (Signed)
Currently not an issue Not medicated 

## 2023-01-04 NOTE — Assessment & Plan Note (Signed)
Not medicated °We will monitor °

## 2023-01-04 NOTE — Assessment & Plan Note (Signed)
Encouraged her to start calcium and vitamin D OTC Encouraged daily weightbearing exercise

## 2023-01-04 NOTE — Assessment & Plan Note (Signed)
C-Met and lipid profile today °Encouraged her to consume a low-fat diet °

## 2023-01-04 NOTE — Assessment & Plan Note (Signed)
Not medicated 

## 2023-01-04 NOTE — Progress Notes (Signed)
Subjective:    Patient ID: Molly Banks, female    DOB: 1961/08/14, 62 y.o.   MRN: [id number]  HPI  Patient presents to clinic today for follow-up of chronic conditions.  COPD: She reports chronic cough and intermittent shortness of breath.  She uses Albuterol only as needed.  There are no PFTs on file.  She does continue to smoke.  Frequent Headaches: These occur rarely.  Triggered by lack of caffeine.  She takes Ibuprofen as needed with good relief of symptoms.  She does not follow with neurology.  GERD: She is not sure what triggers this but has not been an issue lately.  She no longer takes Famotadine.  There is no upper GI on file.  HLD with Aortic Atherosclerosis: Her last LDL was 124, triglycerides 87, 06/2021.  She is not taking any cholesterol-lowering medication at this time.  She tries to consume a low-fat diet.  OAB: She reports mainly urgency and frequency.  She is not taking any medications for this.  She does not follow with urology.  Insomnia: She has difficulty falling asleep.  She is not taking any medication for this.  There is no sleep study on file.  RLS: She is not currently taking any medications for this.  She does not follow with neurology.  Polycythemia: Her last H/H was 14.9/45.4, 07/2022.  She does smoke.  She does not follow with hematology.  Osteopenia: She is not taking any Calcium and Vitamin D OTC.  She does not get any weightbearing exercise.  Bone density from 03/2018 reviewed.  Anxiety and Depression: She has been under a lot of stress lately. She is not currently taking any medication for this. She is not seeing a therapist. She denies depression, SI/HI.  Review of Systems  Past Medical History:  Diagnosis Date   Arthritis    Breast lump in female 2005   COPD (chronic obstructive pulmonary disease) (HCC)    Emphysema lung (HCC)    Erythrocytosis    Frequent headaches    tension related   GERD (gastroesophageal reflux disease)    h/o    Hypoglycemia    Hypoglycemia    Leukocytosis    Thoracic scoliosis    Left concavity   Thyroid nodule    right side    Current Outpatient Medications  Medication Sig Dispense Refill   albuterol (VENTOLIN HFA) 108 (90 Base) MCG/ACT inhaler Inhale 2 puffs into the lungs every 6 (six) hours as needed for wheezing or shortness of breath. 8 g 0   amoxicillin-clavulanate (AUGMENTIN) 875-125 MG tablet Take 1 tablet by mouth 2 (two) times daily. Extension to 5 more days 10 tablet 0   famotidine (PEPCID) 20 MG tablet Take 1 tablet (20 mg total) by mouth 2 (two) times daily.     ibuprofen (ADVIL) 800 MG tablet Take 800 mg by mouth every 8 (eight) hours as needed.     Mag Oxide-Vit D3-Turmeric (MAGNESIUM-VITAMIN D3-TURMERIC) [phone] MG-UNIT-MG TABS Take by mouth.     Magnesium 400 MG TABS Take 1 tablet by mouth at bedtime.     mupirocin ointment (BACTROBAN) 2 % Apply 1 Application topically 2 (two) times daily. For 1-2 weeks or until resolved 22 g 0   valACYclovir (VALTREX) 1000 MG tablet Take 1 tablet (1,000 mg total) by mouth 3 (three) times daily. For shingles 21 tablet 0   No current facility-administered medications for this visit.    Allergies  Allergen Reactions   Guaifenesin Shortness Of Breath and  Itching   Tusso-Df [Hydrocodone-Guaifenesin] Shortness Of Breath   Mobic [Meloxicam] Other (See Comments)    headache    Family History  Problem Relation Age of Onset   Brain cancer Mother    Cancer Mother        brain   Hiatal hernia Mother    Heart attack Father 62   Lung cancer Father    COPD Father    Cancer - Lung Father    Lymphoma Sister    Breast cancer Sister 38       BRCA positive test   Breast cancer Maternal Grandmother    Lung cancer Maternal Grandfather    Breast cancer Daughter 64   Ovarian cancer Neg Hx    Colon cancer Neg Hx    Stroke Neg Hx    Heart disease Neg Hx     Social History   Socioeconomic History   Marital status: Married    Spouse  name: Not on file   Number of children: Not on file   Years of education: Not on file   Highest education level: Not on file  Occupational History   Not on file  Tobacco Use   Smoking status: Every Day    Packs/day: 0.50    Years: 30.00    Additional pack years: 0.00    Total pack years: 15.00    Types: Cigarettes   Smokeless tobacco: Former   Tobacco comments:    14, 20s  Vaping Use   Vaping Use: Former   Substances: Nicotine, Flavoring  Substance and Sexual Activity   Alcohol use: No   Drug use: No   Sexual activity: Not on file  Other Topics Concern   Not on file  Social History Narrative   Not on file   Social Determinants of Health   Financial Resource Strain: Not on file  Food Insecurity: Not on file  Transportation Needs: Not on file  Physical Activity: Not on file  Stress: Not on file  Social Connections: Not on file  Intimate Partner Violence: Not on file     Constitutional: Patient reports intermittent headaches.  Denies fever, malaise, fatigue, or abrupt weight changes.  HEENT: Pt reports decreased hearing. Denies eye pain, eye redness, ear pain, ringing in the ears, wax buildup, runny nose, nasal congestion, bloody nose, or sore throat. Respiratory: Patient reports cough.  Denies difficulty breathing, shortness of breath, or sputum production.   Cardiovascular: Denies chest pain, chest tightness, palpitations or swelling in the hands or feet.  Gastrointestinal: Denies abdominal pain, bloating, constipation, diarrhea or blood in the stool.  GU: Patient reports urinary urgency and frequency.  Denies pain with urination, burning sensation, blood in urine, odor or discharge. Musculoskeletal: Denies decrease in range of motion, difficulty with gait, muscle pain or joint pain and swelling.  Skin: Denies redness, rashes, lesions or ulcercations.  Neurological: Patient reports insomnia, restless legs.  Denies dizziness, difficulty with memory, difficulty with  speech or problems with balance and coordination.  Psych: Patient reports anxiety and depression.  Denies SI/HI.  No other specific complaints in a complete review of systems (except as listed in HPI above).     Objective:   Physical Exam  BP 120/84 (BP Location: Right Arm, Patient Position: Sitting, Cuff Size: Normal)   Pulse 68   Temp (!) 96.2 F (35.7 C) (Temporal)   Wt 121 lb (54.9 kg)   SpO2 92%   BMI 22.13 kg/m   Wt Readings from Last 3 Encounters:  09/16/22 122 lb 12.8 oz (55.7 kg)  09/08/22 121 lb 9.6 oz (55.2 kg)  08/16/22 123 lb (55.8 kg)    General: Appears her stated age, chronically ill-appearing, in NAD. Skin: Warm, dry and intact.  HEENT: Head: normal shape and size; Eyes: sclera white, no icterus, conjunctiva pink, PERRLA and EOMs intact; Ears: Tm's gray and intact, normal light reflex;  Cardiovascular: Normal rate and rhythm. S1,S2 noted.  No murmur, rubs or gallops noted. No JVD or BLE edema. No carotid bruits noted. Pulmonary/Chest: Normal effort and diminished breath sounds. No respiratory distress. No wheezes, rales or ronchi noted.  Abdomen: Normal bowel sounds.  Musculoskeletal: No difficulty with gait.  Neurological: Alert and oriented. Coordination normal.  Psychiatric: Mood and affect mildly flat. Behavior is normal. Judgment and thought content normal.     BMET    Component Value Date/Time   NA 135 08/16/2022 1544   K 3.1 (L) 08/16/2022 1544   CL 97 (L) 08/16/2022 1544   CO2 28 08/16/2022 1544   GLUCOSE 124 (H) 08/16/2022 1544   BUN 5 (L) 08/16/2022 1544   CREATININE 0.69 08/16/2022 1544   CREATININE 0.61 06/29/2022 0840   CALCIUM 8.6 (L) 08/16/2022 1544   GFRNONAA >60 08/16/2022 1544   GFRNONAA 98 12/12/2019 0840   GFRAA >60 01/03/2020 1210   GFRAA 113 12/12/2019 0840    Lipid Panel     Component Value Date/Time   CHOL 214 (H) 06/24/2021 0758   TRIG 87 06/24/2021 0758   HDL 71 06/24/2021 0758   CHOLHDL 3.0 06/24/2021 0758   VLDL  12 04/03/2017 0800   LDLCALC 124 (H) 06/24/2021 0758    CBC    Component Value Date/Time   WBC 9.1 08/16/2022 1544   RBC 5.19 (H) 08/16/2022 1544   HGB 14.9 08/16/2022 1544   HGB 14.2 08/18/2021 1543   HCT 45.4 08/16/2022 1544   HCT 40.9 08/18/2021 1543   PLT 179 08/16/2022 1544   MCV 87.5 08/16/2022 1544   MCH 28.7 08/16/2022 1544   MCHC 32.8 08/16/2022 1544   RDW 12.6 08/16/2022 1544   LYMPHSABS 1.2 08/16/2022 1544   MONOABS 1.6 (H) 08/16/2022 1544   EOSABS 0.0 08/16/2022 1544   BASOSABS 0.0 08/16/2022 1544    Hgb A1C Lab Results  Component Value Date   HGBA1C 5.2 06/24/2021           Assessment & Plan:      RTC in 6 months for your annual exam Nicki Reaper, NP

## 2023-01-04 NOTE — Patient Instructions (Signed)
Atherosclerosis  Atherosclerosis is when plaque builds up in the arteries. This causes narrowing and hardening of the arteries. Arteries are blood vessels that carry blood from the heart to all parts of the body. This blood contains oxygen. Plaque occurs due to inflammation or from a buildup of fat, cholesterol, calcium, waste products of cells, and a clotting material in the blood (fibrin). Plaque decreases the amount of blood that can flow through the artery. Atherosclerosis can affect any artery in your body, including: Heart arteries. Damage to these arteries may lead to coronary artery disease, which can cause a heart attack. Brain arteries. Damage to these arteries may cause a stroke. Leg, arm, and pelvis arteries. Peripheral artery disease (PAD) may result from damage to these arteries. Kidney arteries. Kidney (renal) failure may result from damage to kidney arteries. Treatment may slow the disease and prevent further damage to your heart, brain, peripheral arteries, and kidneys. What are the causes? This condition develops slowly over many years. The inner layers of your arteries become damaged and allow the gradual buildup of plaque. The exact cause of atherosclerosis is not fully understood. Symptoms of atherosclerosis do not occur until an artery becomes narrow or blocked. What increases the risk? The following factors may make you more likely to develop this condition: Being middle-aged or older. Certain medical conditions, including: High blood pressure. High cholesterol. High blood fats (triglycerides). Diabetes. Sleep apnea. Obesity. Certain lab levels, including: Elevated C-reactive protein (CRP). This is a sign of increased inflammation in your body. Elevated homocysteine levels. This is an amino acid that is associated with heart and blood vessel disease. Using tobacco or nicotine products. A family history of atherosclerosis. Not exercising enough (sedentary  lifestyle). Being stressed. Drinking too much alcohol or using drugs, such as cocaine or methamphetamine. What are the signs or symptoms? Symptoms of atherosclerosis do not occur until the plaque severely narrows or blocks the artery, which decreases blood flow. Sometimes, atherosclerosis does not cause symptoms. Symptoms of this condition include: Coronary artery disease. This may cause chest pain and shortness of breath. Decreased blood supply to your brain, which may cause a stroke. Signs of a stroke may include sudden: Weakness or numbness in your face, arm, or leg, especially on one side of your body. Trouble walking or difficulty moving your arms or legs. Loss of balance or coordination. Confusion. Slurred speech. Trouble speaking, or trouble understanding speech, or both (aphasia). Vision changes in one or both eyes. This may be double vision, blurred vision, or loss of vision. Severe headache with no known cause. The headache is often described as the worst headache ever experienced. PAD, which may cause pain, numbness, or nonhealing wounds, often in your legs and hips. Renal failure. This may cause tiredness, problems with urination, swelling, and itchy skin. How is this diagnosed? This condition is diagnosed based on your medical history and a physical exam. During the exam, your health care provider will: Check your pulse in different places. Listen for a "whooshing" sound over your arteries (bruit). You may also have tests, such as: Blood tests to check your levels of cholesterol, triglycerides, blood sugar, and CRP. Ankle-brachial index to compare blood pressure in your arms to blood pressure in your ankles to see how your blood is flowing. Heart (cardiac) tests. Electrocardiogram (ECG) to check for heart damage. Stress test to see how your heart reacts to exercise. Ultrasound tests. Ultrasound of your peripheral arteries to check blood flow. Echocardiogram to get images of  your heart's   chambers and valves. X-Wenig tests. Chest X-Zeringue to see if you have an enlarged heart, which is a sign of heart failure. CT scan to check for damage to your heart, brain, or arteries. Angiogram. This is a test where dye is injected and X-rays are used to see the blood flow in the arteries. How is this treated? This condition is treated with lifestyle changes as the first step. These may include: Changing your diet. Losing weight. Reducing stress. Exercising and being physically active more regularly. Quitting smoking. You may also need medicine to: Lower triglycerides and cholesterol. Control blood pressure. Prevent blood clots. Lower inflammation in your body. Control your blood sugar. Sometimes, surgery is needed to: Remove plaque from an artery (endarterectomy). Open or widen a narrowed heart artery or peripheral artery (angioplasty). Create a new path for your blood with one of these procedures: Heart (coronary) artery bypass graft surgery. Peripheral artery bypass graft surgery. Place a small mesh tube (stent) in an artery to open or widen a narrowed artery. Follow these instructions at home: Eating and drinking  Eat a heart-healthy diet. Talk with your health care provider or a dietitian if you need help. A heart-healthy diet involves: Limiting unhealthy fats and increasing healthy fats. Some examples of healthy fats are avocados and olive oil. Eating plant-based foods, such as fruits, vegetables, nuts, whole grains, and legumes (such as peas and lentils). If you drink alcohol: Limit how much you have to: 0-1 drink a day for women who are not pregnant. 0-2 drinks a day for men. Know how much alcohol is in a drink. In the [place] one drink equals one 12 oz bottle of beer (355 mL), one 5 oz glass of wine (148 mL), or one 1 oz glass of hard liquor (44 mL). Lifestyle  Maintain a healthy weight. Lose weight if your health care provider says that you need to do  that. Follow an exercise program as told by your health care provider. Do not use any products that contain nicotine or tobacco. These products include cigarettes, chewing tobacco, and vaping devices, such as e-cigarettes. If you need help quitting, ask your health care provider. Do not use drugs. General instructions Take over-the-counter and prescription medicines only as told by your health care provider. Manage other health conditions as told. Keep all follow-up visits. This is important. Contact a health care provider if you have: An irregular heartbeat. Unexplained tiredness (fatigue). Trouble urinating, or you are producing less urine or foamy urine. Swelling of your hands or feet, or itchy skin. Unexplained pain or numbness in your legs or hips. A wound that is slow to heal or is not healing. Get help right away if: You have any symptoms of a heart attack. These may be: Chest pain. This includes squeezing chest pain that may feel like indigestion (angina). Shortness of breath. Pain in your neck, jaw, arms, back, or stomach. Cold sweat. Nausea. Light-headedness. Sudden pain, numbness, or coldness in a limb. You have any symptoms of a stroke. "BE FAST" is an easy way to remember the main warning signs of a stroke: B - Balance. Signs are dizziness, sudden trouble walking, or loss of balance. E - Eyes. Signs are trouble seeing or a sudden change in vision. F - Face. Signs are sudden weakness or numbness of the face, or the face or eyelid drooping on one side. A - Arms. Signs are weakness or numbness in an arm. This happens suddenly and usually on one side of the body. S -   Speech. Signs are sudden trouble speaking, slurred speech, or trouble understanding what people say. T - Time. Time to call emergency services. Write down what time symptoms started. You have other signs of a stroke, such as: A sudden, severe headache with no known cause. Nausea or vomiting. Seizure. These  symptoms may represent a serious problem that is an emergency. Do not wait to see if the symptoms will go away. Get medical help right away. Call your local emergency services (911 in the [place] Do not drive yourself to the hospital. Summary Atherosclerosis is when plaque builds up in the arteries and causes narrowing and hardening of the arteries. Plaque occurs due to inflammation or from a buildup of fat, cholesterol, calcium, cellular waste products, and fibrin. This condition may not cause any symptoms. Symptoms of atherosclerosis do not occur until the plaque severely narrows or blocks the artery. Treatment starts with lifestyle changes and may include medicines. In some cases, surgery is needed. Get help right away if you have any symptoms of a heart attack or stroke. This information is not intended to replace advice given to you by your health care provider. Make sure you discuss any questions you have with your health care provider. Document Revised: 11/11/2020 Document Reviewed: 11/11/2020 Elsevier Patient Education  2023 [employer]  

## 2023-01-05 ENCOUNTER — Telehealth: Payer: Self-pay | Admitting: Internal Medicine

## 2023-01-05 DIAGNOSIS — E7800 Pure hypercholesterolemia, unspecified: Secondary | ICD-10-CM

## 2023-01-05 LAB — COMPLETE METABOLIC PANEL WITH GFR
AG Ratio: 1.9 (calc) (ref 1.0–2.5)
ALT: 6 U/L (ref 6–29)
AST: 14 U/L (ref 10–35)
Albumin: 4.5 g/dL (ref 3.6–5.1)
Alkaline phosphatase (APISO): 84 U/L (ref 37–153)
BUN: 9 mg/dL (ref 7–25)
CO2: 30 mmol/L (ref 20–32)
Calcium: 9.5 mg/dL (ref 8.6–10.4)
Chloride: 105 mmol/L (ref 98–110)
Creat: 0.65 mg/dL (ref 0.50–1.05)
Globulin: 2.4 g/dL (calc) (ref 1.9–3.7)
Glucose, Bld: 73 mg/dL (ref 65–139)
Potassium: 4 mmol/L (ref 3.5–5.3)
Sodium: 142 mmol/L (ref 135–146)
Total Bilirubin: 0.5 mg/dL (ref 0.2–1.2)
Total Protein: 6.9 g/dL (ref 6.1–8.1)
eGFR: 100 mL/min/{1.73_m2} (ref >=60)

## 2023-01-05 LAB — CBC
HCT: 49.6 % — ABNORMAL HIGH (ref 35.0–45.0)
Hemoglobin: 16.3 g/dL — ABNORMAL HIGH (ref 11.7–15.5)
MCH: 28.6 pg (ref 27.0–33.0)
MCHC: 32.9 g/dL (ref 32.0–36.0)
MCV: 87.2 fL (ref 80.0–100.0)
MPV: 9.5 fL (ref 7.5–12.5)
Platelets: 319 10*3/uL (ref 140–400)
RBC: 5.69 10*6/uL — ABNORMAL HIGH (ref 3.80–5.10)
RDW: 12.5 % (ref 11.0–15.0)
WBC: 10.8 10*3/uL (ref 3.8–10.8)

## 2023-01-05 LAB — HEMOGLOBIN A1C
Hgb A1c MFr Bld: 5.5 % of total Hgb (ref <5.7)
Mean Plasma Glucose: 111 mg/dL
eAG (mmol/L): 6.2 mmol/L

## 2023-01-05 LAB — LIPID PANEL
Cholesterol: 236 mg/dL — ABNORMAL HIGH (ref <200)
HDL: 99 mg/dL (ref >=50)
LDL Cholesterol (Calc): 119 mg/dL (calc) — ABNORMAL HIGH
Non-HDL Cholesterol (Calc): 137 mg/dL (calc) — ABNORMAL HIGH (ref <130)
Total CHOL/HDL Ratio: 2.4 (calc) (ref <5.0)
Triglycerides: 83 mg/dL (ref <150)

## 2023-01-05 MED ORDER — BREZTRI AEROSPHERE 160-9-4.8 MCG/ACT IN AERO: 2.0000 | INHALATION_SPRAY | Freq: Two times a day (BID) | RESPIRATORY_TRACT | 11 refills | Status: DC

## 2023-01-05 MED ORDER — SIMVASTATIN 10 MG PO TABS: 10.0000 mg | ORAL_TABLET | Freq: Every day | ORAL | 1 refills | Status: DC

## 2023-01-05 NOTE — Telephone Encounter (Signed)
The patient called in stating she will go ahead with the medication her provider recommends due to her blood work that just came back. She is not sure of what the name is but she states her provider will know. She also wants to let her provider know she cannot afford the Fluticasone-Umeclidin-Vilant (TRELEGY ELLIPTA) 100-62.5-25 MCG/ACT AEPB even with her insurance. Please call the medication into  [facility] [address] [place], [place] - [address] Phone: [phone]  Fax: [phone]     Please assist patient further

## 2023-01-05 NOTE — Addendum Note (Signed)
Addended by: Lorre Munroe on: 01/05/2023 10:56 AM   Modules accepted: Orders

## 2023-01-05 NOTE — Telephone Encounter (Signed)
Simvastatin sent to pharmacy.  Have her schedule III month lab only appointment.  I have sent in Lehr in place of Trelegy to see if this is more affordable for her.

## 2023-01-30 ENCOUNTER — Other Ambulatory Visit: Payer: Self-pay | Admitting: *Deleted

## 2023-01-30 DIAGNOSIS — Z87891 Personal history of nicotine dependence: Secondary | ICD-10-CM

## 2023-01-30 DIAGNOSIS — F17210 Nicotine dependence, cigarettes, uncomplicated: Secondary | ICD-10-CM

## 2023-01-30 DIAGNOSIS — Z122 Encounter for screening for malignant neoplasm of respiratory organs: Secondary | ICD-10-CM

## 2023-02-06 ENCOUNTER — Ambulatory Visit
Admission: RE | Admit: 2023-02-06 | Discharge: 2023-02-06 | Disposition: A | Discharge status: Home / Self Care | Payer: BC Managed Care – PPO | Source: Ambulatory Visit | Attending: Acute Care | Admitting: Acute Care

## 2023-02-06 DIAGNOSIS — F17210 Nicotine dependence, cigarettes, uncomplicated: Secondary | ICD-10-CM | POA: Diagnosis not present

## 2023-02-06 DIAGNOSIS — Z122 Encounter for screening for malignant neoplasm of respiratory organs: Secondary | ICD-10-CM | POA: Diagnosis not present

## 2023-02-06 DIAGNOSIS — Z87891 Personal history of nicotine dependence: Secondary | ICD-10-CM | POA: Diagnosis not present

## 2023-02-10 ENCOUNTER — Other Ambulatory Visit: Payer: Self-pay | Admitting: Acute Care

## 2023-02-10 DIAGNOSIS — Z122 Encounter for screening for malignant neoplasm of respiratory organs: Secondary | ICD-10-CM

## 2023-02-10 DIAGNOSIS — Z87891 Personal history of nicotine dependence: Secondary | ICD-10-CM

## 2023-02-10 DIAGNOSIS — F17210 Nicotine dependence, cigarettes, uncomplicated: Secondary | ICD-10-CM

## 2023-03-04 DIAGNOSIS — M25511 Pain in right shoulder: Secondary | ICD-10-CM | POA: Diagnosis not present

## 2023-03-17 ENCOUNTER — Other Ambulatory Visit: Payer: Self-pay

## 2023-03-17 ENCOUNTER — Emergency Department
Admission: EM | Admit: 2023-03-17 | Discharge: 2023-03-17 | Disposition: A | Discharge status: Home / Self Care | Payer: BC Managed Care – PPO | Attending: Emergency Medicine | Admitting: Emergency Medicine

## 2023-03-17 ENCOUNTER — Ambulatory Visit: Payer: Self-pay | Admitting: *Deleted

## 2023-03-17 ENCOUNTER — Emergency Department: Payer: BC Managed Care – PPO

## 2023-03-17 DIAGNOSIS — U071 COVID-19: Secondary | ICD-10-CM | POA: Diagnosis not present

## 2023-03-17 DIAGNOSIS — R0789 Other chest pain: Secondary | ICD-10-CM | POA: Diagnosis not present

## 2023-03-17 DIAGNOSIS — R0602 Shortness of breath: Secondary | ICD-10-CM | POA: Diagnosis not present

## 2023-03-17 DIAGNOSIS — R079 Chest pain, unspecified: Secondary | ICD-10-CM | POA: Diagnosis not present

## 2023-03-17 DIAGNOSIS — J441 Chronic obstructive pulmonary disease with (acute) exacerbation: Secondary | ICD-10-CM | POA: Diagnosis not present

## 2023-03-17 LAB — D-DIMER, QUANTITATIVE: D-Dimer, Quant: 0.37 ug/mL-FEU (ref 0.00–0.50)

## 2023-03-17 LAB — CBC
HCT: 44.4 % (ref 36.0–46.0)
Hemoglobin: 15.1 g/dL — ABNORMAL HIGH (ref 12.0–15.0)
MCH: 28.8 pg (ref 26.0–34.0)
MCHC: 34 g/dL (ref 30.0–36.0)
MCV: 84.7 fL (ref 80.0–100.0)
Platelets: 235 10*3/uL (ref 150–400)
RBC: 5.24 MIL/uL — ABNORMAL HIGH (ref 3.87–5.11)
RDW: 13 % (ref 11.5–15.5)
WBC: 5.9 10*3/uL (ref 4.0–10.5)
nRBC: 0 % (ref 0.0–0.2)

## 2023-03-17 LAB — BASIC METABOLIC PANEL
Anion gap: 8 (ref 5–15)
BUN: 13 mg/dL (ref 8–23)
CO2: 24 mmol/L (ref 22–32)
Calcium: 8.4 mg/dL — ABNORMAL LOW (ref 8.9–10.3)
Chloride: 105 mmol/L (ref 98–111)
Creatinine, Ser: 0.74 mg/dL (ref 0.44–1.00)
GFR, Estimated: >60 mL/min (ref >60)
Glucose, Bld: 87 mg/dL (ref 70–99)
Potassium: 3.5 mmol/L (ref 3.5–5.1)
Sodium: 137 mmol/L (ref 135–145)

## 2023-03-17 LAB — SARS CORONAVIRUS 2 BY RT PCR: SARS Coronavirus 2 by RT PCR: POSITIVE — AB

## 2023-03-17 LAB — TROPONIN I (HIGH SENSITIVITY): Troponin I (High Sensitivity): 3 ng/L (ref <18)

## 2023-03-17 MED ORDER — IPRATROPIUM-ALBUTEROL 0.5-2.5 (3) MG/3ML IN SOLN
9.0000 mL | Freq: Once | RESPIRATORY_TRACT | Status: AC
  Administered 2023-03-17: 9 mL via RESPIRATORY_TRACT
  Filled 2023-03-17: qty 3

## 2023-03-17 MED ORDER — METHYLPREDNISOLONE SODIUM SUCC 125 MG IJ SOLR
125.0000 mg | Freq: Once | INTRAMUSCULAR | Status: AC
  Administered 2023-03-17: 125 mg via INTRAVENOUS
  Filled 2023-03-17: qty 2

## 2023-03-17 MED ORDER — PREDNISONE 50 MG PO TABS: 50.0000 mg | ORAL_TABLET | Freq: Every day | ORAL | 0 refills | Status: AC

## 2023-03-17 NOTE — ED Triage Notes (Signed)
Pt here with cp since last night. Pt states pain is centered and radiates to her back. Pt states she took an expired covid test that was positive. Pt states she has been aching all over. Pt states she has also been SOB. Pt states pain is intermittent. Pt only had diarrhea but denies NV.

## 2023-03-17 NOTE — Telephone Encounter (Signed)
Reason for Disposition  Patient sounds very sick or weak to the triager    Referred to the ED.  Answer Assessment - Initial Assessment Questions 1. ONSET: "When did the cough begin?"      I've been sick for 3 days now.   I was aching all over.    I'm taking Extra Strength Cold and FLu.   My chest is hurting and my head is stopped up. My chest is hurting in the front middle and sometimes my whole chest.    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 Every once in a while I have a sharp pain in my chest.    Last night my chest hurt all night.    If I do a lot of walking I have shortness of breath.   Because I'm sick I've been short of breath. I did a home test of Covid and it was pink.   Because it was out of date it probably wasn't correct but I don't know. I was aching all over and coughing for the last 3 days and weak.   I still had my smell and taste.   I had diarrhea one time.   No vomiting. 2. SEVERITY: "How bad is the cough today?"      I worked every day during all of this.   I work at [employer] and everyone has been sick.    3. SPUTUM: "Describe the color of your sputum" (none, dry cough; clear, white, yellow, green)     It's clear mucus I'm coughing up.    I'm sweating a lot 4. HEMOPTYSIS: "Are you coughing up any blood?" If so ask: "How much?" (flecks, streaks, tablespoons, etc.)     Not asked 5. DIFFICULTY BREATHING: "Are you having difficulty breathing?" If Yes, ask: "How bad is it?" (e.g., mild, moderate, severe)    - MILD: No SOB at rest, mild SOB with walking, speaks normally in sentences, can lie down, no retractions, pulse < 100.    - MODERATE: SOB at rest, SOB with minimal exertion and prefers to sit, cannot lie down flat, speaks in phrases, mild retractions, audible wheezing, pulse 100-120.    - SEVERE: Very SOB at rest, speaks in single words, struggling to breathe, sitting hunched forward, retractions, pulse > 120      The chest pain and shortness of breath is since  I've been sick.   Deep breathing does not hurt.   When sitting I have chest pain.   It goes through to my back.    6. FEVER: "Do you have a fever?" If Yes, ask: "What is your temperature, how was it measured, and when did it start?"     I'm sweating.     7. 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   If I get really stressed out I have bad chest pains.     This pain feels similar to that.  I'm not stressed now.   If I go I'll go to [facility].    I'm at work now at [employer].    8. LUNG HISTORY: "Do you have any history of lung disease?"  (e.g., pulmonary embolus, asthma, emphysema)     Not asked    At this point I referred her to the ED 9. PE RISK FACTORS: "  Do you have a history of blood clots?" (or: recent major surgery, recent prolonged travel, bedridden)     Not asked 10. OTHER SYMPTOMS: "Do you have any other symptoms?" (e.g., runny nose, wheezing, chest pain)       Sweating, chest pain radiating into her back, coughing, shortness of breath and weak. 11. PREGNANCY: "Is there any chance you are pregnant?" "When was your last menstrual period?"       N/A due to age 46. TRAVEL: "Have you traveled out of the country in the last month?" (e.g., travel history, exposures)       Works at [employer] so exposed to a lot of sick people.  Protocols used: Cough - Acute Productive-A-AH

## 2023-03-17 NOTE — Discharge Instructions (Addendum)
You were seen in the emergency department for shortness of breath and wheezing.  You had a chest x-Lipinski done that did not show any signs of pneumonia.  You had no findings of blood clots or signs of a heart attack.  You tested positive for COVID.  You are given a breathing treatment and steroids in the emergency department.  You are given a prescription for steroids given your COPD exacerbation.  Use your albuterol inhaler at home 4 puffs every 4-6 hours as needed for shortness of breath and wheezing.  Call and schedule close follow-up appointment with your primary care provider.  Return for worsening shortness of breath.  Prednisone -you are given a prescription for a steroid.  It is important that you take this medication with food.  This medication can cause an upset stomach.  It also can increase your glucose if you have a history of diabetes, so it is important that you check your glucose frequently while you are on this medication.

## 2023-03-17 NOTE — ED Provider Notes (Signed)
[facility] Provider Note    Event Date/Time   First MD Initiated Contact with Patient 03/17/23 [id number]     (approximate)   History   Chest Pain   HPI  Molly Banks is a 62 y.o. female past medical history significant for COPD who presents to the emergency department with shortness of breath and wheezing.  States that she has not been feeling well for the past couple of days.  Cough and shortness of breath.  Took a home COVID test and was positive but it was expired so she was concerned that it could be a false positive.  Complaining of chest pain today that is worse when taking a deep breath.  Denies any history of DVT or PE.  No active chest pain at this time.     Physical Exam   Triage Vital Signs: ED Triage Vitals  Encounter Vitals Group     BP 03/17/23 0953 123/84     Systolic BP Percentile --      Diastolic BP Percentile --      Pulse Rate 03/17/23 0953 86     Resp 03/17/23 0953 16     Temp 03/17/23 0953 97.8 F (36.6 C)     Temp Source 03/17/23 0953 Oral     SpO2 03/17/23 0953 98 %     Weight 03/17/23 0951 121 lb 0.5 oz (54.9 kg)     Height 03/17/23 0951 5\' 2"  (1.575 m)     Head Circumference --      Peak Flow --      Pain Score 03/17/23 0951 10     Pain Loc --      Pain Education --      Exclude from Growth Chart --     Most recent vital signs: Vitals:   03/17/23 0953  BP: 123/84  Pulse: 86  Resp: 16  Temp: 97.8 F (36.6 C)  SpO2: 98%    Physical Exam Constitutional:      Appearance: She is well-developed.  HENT:     Head: Atraumatic.  Eyes:     Conjunctiva/sclera: Conjunctivae normal.  Cardiovascular:     Rate and Rhythm: Regular rhythm.  Pulmonary:     Effort: Tachypnea present.     Breath sounds: Wheezing present.  Abdominal:     General: There is no distension.  Musculoskeletal:        General: Normal range of motion.     Cervical back: Normal range of motion.     Right lower leg: No edema.     Left lower leg:  No edema.  Skin:    General: Skin is warm.  Neurological:     Mental Status: She is alert. Mental status is at baseline.     IMPRESSION / MDM / ASSESSMENT AND PLAN / ED COURSE  I reviewed the triage vital signs and the nursing notes.  Differential diagnosis including COPD exacerbation, pneumonia, viral illness including COVID, pulmonary embolism, ACS  EKG  I, Corena Herter, the attending physician, personally viewed and interpreted this ECG.   Rate: Normal  Rhythm: Normal sinus  Axis: Normal  Intervals: Concern for atrial enlargement  ST&T Change: T wave inverted to lead III and lateral leads.  ST depression, no ST elevation.  Worsening ST depression in the lateral and inferior leads when compared to prior EKG.  No tachycardic or bradycardic dysrhythmias while on cardiac telemetry.  RADIOLOGY I independently reviewed imaging, my interpretation of imaging: Chest x-[place] with  no focal findings consistent with pneumonia  LABS (all labs ordered are listed, but only abnormal results are displayed) Labs interpreted as -    Labs Reviewed  SARS CORONAVIRUS 2 BY RT PCR - Abnormal; Notable for the following components:      Result Value   SARS Coronavirus 2 by RT PCR POSITIVE (*)    All other components within normal limits  BASIC METABOLIC PANEL - Abnormal; Notable for the following components:   Calcium 8.4 (*)    All other components within normal limits  CBC - Abnormal; Notable for the following components:   RBC 5.24 (*)    Hemoglobin 15.1 (*)    All other components within normal limits  D-DIMER, QUANTITATIVE  TROPONIN I (HIGH SENSITIVITY)     MDM  Low risk Wells criteria, D-dimer is negative, low suspicion for pulmonary embolism.  No active chest pain at this time, low risk heart score and initial troponin is negative, do not feel that repeat troponin is necessary at this time and have a low suspicion for ACS.  Chest x-[place] reviewed without focal findings consistent with  pneumonia.  COVID testing came back positive.  No significant anemia or electrolyte disturbances on lab work.  Patient was given DuoNeb treatments and IV Solu-Medrol.  On reevaluation patient states that she is feeling much better.  Has plenty of albuterol at home.  Discussed quarantining and following up with primary care physician.  Discussed albuterol inhaler and steroids.  Given return precautions for any worsening symptoms.     PROCEDURES:  Critical Care performed: No  Procedures  Patient's presentation is most consistent with acute presentation with potential threat to life or bodily function.   MEDICATIONS ORDERED IN ED: Medications  ipratropium-albuterol (DUONEB) 0.5-2.5 (3) MG/3ML nebulizer solution 9 mL (9 mLs Nebulization Given 03/17/23 1046)  methylPREDNISolone sodium succinate (SOLU-MEDROL) 125 mg/2 mL injection 125 mg (125 mg Intravenous Given 03/17/23 1039)    FINAL CLINICAL IMPRESSION(S) / ED DIAGNOSES   Final diagnoses:  COVID  COPD exacerbation (HCC)     Rx / DC Orders   ED Discharge Orders          Ordered    predniSONE (DELTASONE) 50 MG tablet  Daily with breakfast        03/17/23 1110             Note:  This document was prepared using Dragon voice recognition software and may include unintentional dictation errors.   Corena Herter, MD 03/17/23 1113

## 2023-03-17 NOTE — Telephone Encounter (Addendum)
  Chief Complaint: Chest pain radiating into her back, coughing, sweating, nasal congestion.   Had chest pain all night last night Symptoms: chest pain present even when she is not coughing.   It hurt all night last night.   She is at work now at [employer].   I instructed her to go to the ED that her symptoms are concerning.    Her Covid test was out of date but came up positive.   She's been real sick for the last 3 days.  She is having some shortness of breath with sitting and more so with walking.   Sweating Frequency: The last 3 days having chest pain and been real sick with body aches, weakness, nasal congestion and coughing but chest pain is constant even when not coughing.   It hurt all night last night. Pertinent Negatives: Patient denies cardiac problems other than she does have chest pains when she is "stressed out".   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 Disposition: [x] ED /[] Urgent Care (no appt availability in office) / [] Appointment(In office/virtual)/ []  [facility]/ [] Home Care/ [] Refused Recommended Disposition /[] [facility]/ []  Follow-up with PCP Additional Notes: Referred her to the ED.    "If I go I'll go to [facility]".    "I'm at work right now at [employer]".    "I'm only 5 min. Away from the hospital".     She never committed to going to the ED.   I let her know the importance of going and why.  I have sent a message to Nicki Reaper, NP letting her know.   I called [facility] and spoke with Herbert Seta to be sure they were aware of pt possibly not going to the ED.

## 2023-03-20 DIAGNOSIS — M25511 Pain in right shoulder: Secondary | ICD-10-CM | POA: Diagnosis not present

## 2023-04-11 ENCOUNTER — Other Ambulatory Visit: Payer: BC Managed Care – PPO

## 2023-04-11 DIAGNOSIS — E7800 Pure hypercholesterolemia, unspecified: Secondary | ICD-10-CM

## 2023-04-12 LAB — COMPLETE METABOLIC PANEL WITH GFR
AG Ratio: 1.8 (calc) (ref 1.0–2.5)
ALT: 4 U/L — ABNORMAL LOW (ref 6–29)
AST: 11 U/L (ref 10–35)
Albumin: 4.2 g/dL (ref 3.6–5.1)
Alkaline phosphatase (APISO): 88 U/L (ref 37–153)
BUN: 8 mg/dL (ref 7–25)
CO2: 30 mmol/L (ref 20–32)
Calcium: 9.2 mg/dL (ref 8.6–10.4)
Chloride: 104 mmol/L (ref 98–110)
Creat: 0.69 mg/dL (ref 0.50–1.05)
Globulin: 2.3 g/dL (ref 1.9–3.7)
Glucose, Bld: 85 mg/dL (ref 65–99)
Potassium: 4.6 mmol/L (ref 3.5–5.3)
Sodium: 140 mmol/L (ref 135–146)
Total Bilirubin: 0.3 mg/dL (ref 0.2–1.2)
Total Protein: 6.5 g/dL (ref 6.1–8.1)
eGFR: 99 mL/min/{1.73_m2} (ref >=60)

## 2023-04-12 LAB — LIPID PANEL
Cholesterol: 215 mg/dL — ABNORMAL HIGH (ref <200)
HDL: 80 mg/dL (ref >=50)
LDL Cholesterol (Calc): 114 mg/dL — ABNORMAL HIGH
Non-HDL Cholesterol (Calc): 135 mg/dL — ABNORMAL HIGH (ref <130)
Total CHOL/HDL Ratio: 2.7 (calc) (ref <5.0)
Triglycerides: 100 mg/dL (ref <150)

## 2023-07-07 ENCOUNTER — Ambulatory Visit (INDEPENDENT_AMBULATORY_CARE_PROVIDER_SITE_OTHER): Payer: BC Managed Care – PPO | Admitting: Internal Medicine

## 2023-07-07 ENCOUNTER — Encounter: Payer: Self-pay | Admitting: Internal Medicine

## 2023-07-07 ENCOUNTER — Encounter: Payer: BC Managed Care – PPO | Admitting: Internal Medicine

## 2023-07-07 VITALS — BP 128/84 | Ht 62.0 in | Wt 126.0 lb

## 2023-07-07 DIAGNOSIS — Z0001 Encounter for general adult medical examination with abnormal findings: Secondary | ICD-10-CM

## 2023-07-07 DIAGNOSIS — E7800 Pure hypercholesterolemia, unspecified: Secondary | ICD-10-CM

## 2023-07-07 DIAGNOSIS — R739 Hyperglycemia, unspecified: Secondary | ICD-10-CM | POA: Diagnosis not present

## 2023-07-07 NOTE — Progress Notes (Signed)
Subjective:    Patient ID: Molly Banks, female    DOB: 08/02/1961, 62 y.o.   MRN: [id number]  HPI  Patient presents to clinic today for her annual exam.  Flu: never Tetanus: < 10 years COVID: Janssen x1 Shingrix: never Pap smear: Hysterectomy Mammogram: 12/2021 Bone density: 08/2022 Colon screening: 12/2013 Vision screening: annually Dentist: as needed, dentures  Diet: She does eat meat. She consumes fruits and veggies. She does eat fried foods. She drinks mostly soda. Exercise: Walking  Review of Systems  Past Medical History:  Diagnosis Date   Arthritis    Breast lump in female 2005   COPD (chronic obstructive pulmonary disease) (HCC)    Emphysema lung (HCC)    Erythrocytosis    Frequent headaches    tension related   GERD (gastroesophageal reflux disease)    h/o   Hypoglycemia    Hypoglycemia    Leukocytosis    Thoracic scoliosis    Left concavity   Thyroid nodule    right side    Current Outpatient Medications  Medication Sig Dispense Refill   albuterol (VENTOLIN HFA) 108 (90 Base) MCG/ACT inhaler Inhale 2 puffs into the lungs every 6 (six) hours as needed for wheezing or shortness of breath. 8 g 0   aspirin EC 81 MG tablet Take 1 tablet (81 mg total) by mouth daily. Swallow whole. 30 tablet 12   Budeson-Glycopyrrol-Formoterol (BREZTRI AEROSPHERE) 160-9-4.8 MCG/ACT AERO Inhale 2 puffs into the lungs 2 (two) times daily. 10.7 g 11   busPIRone (BUSPAR) 10 MG tablet Take 1 tablet (10 mg total) by mouth 2 (two) times daily. 180 tablet 1   ibuprofen (ADVIL) 800 MG tablet Take 800 mg by mouth every 8 (eight) hours as needed.     Mag Oxide-Vit D3-Turmeric (MAGNESIUM-VITAMIN D3-TURMERIC) [phone] MG-UNIT-MG TABS Take by mouth.     Magnesium 400 MG TABS Take 1 tablet by mouth at bedtime.     simvastatin (ZOCOR) 10 MG tablet Take 1 tablet (10 mg total) by mouth at bedtime. 90 tablet 1   No current facility-administered medications for this visit.    Allergies   Allergen Reactions   Guaifenesin Shortness Of Breath and Itching   Tusso-Df [Hydrocodone-Guaifenesin] Shortness Of Breath   Mobic [Meloxicam] Other (See Comments)    headache    Family History  Problem Relation Age of Onset   Brain cancer Mother    Cancer Mother        brain   Hiatal hernia Mother    Heart attack Father 52   Lung cancer Father    COPD Father    Cancer - Lung Father    Lymphoma Sister    Breast cancer Sister 31       BRCA positive test   Breast cancer Maternal Grandmother    Lung cancer Maternal Grandfather    Breast cancer Daughter 63   Ovarian cancer Neg Hx    Colon cancer Neg Hx    Stroke Neg Hx    Heart disease Neg Hx     Social History   Socioeconomic History   Marital status: Married    Spouse name: Not on file   Number of children: Not on file   Years of education: Not on file   Highest education level: Not on file  Occupational History   Not on file  Tobacco Use   Smoking status: Every Day    Current packs/day: 0.50    Average packs/day: 0.5 packs/day for 30.0 years (  15.0 ttl pk-yrs)    Types: Cigarettes   Smokeless tobacco: Former   Tobacco comments:    14, 20s  Vaping Use   Vaping status: Former   Substances: Nicotine, Flavoring  Substance and Sexual Activity   Alcohol use: No   Drug use: No   Sexual activity: Not on file  Other Topics Concern   Not on file  Social History Narrative   Not on file   Social Determinants of Health   Financial Resource Strain: Not on file  Food Insecurity: Not on file  Transportation Needs: Not on file  Physical Activity: Not on file  Stress: Not on file  Social Connections: Not on file  Intimate Partner Violence: Not on file     Constitutional: Patient reports intermittent headaches.  Denies fever, malaise, fatigue, or abrupt weight changes.  HEENT: Denies eye pain, eye redness, ear pain, ringing in the ears, wax buildup, runny nose, nasal congestion, bloody nose, or sore  throat. Respiratory: Pt reports chronic cough. Denies difficulty breathing, shortness of breath, or sputum production.   Cardiovascular: Denies chest pain, chest tightness, palpitations or swelling in the hands or feet.  Gastrointestinal:  Denies constipation, diarrhea or blood in the stool.  GU: Patient reports urinary frequency and urgency.  Denies pain with urination, burning sensation, blood in urine, odor or discharge. Musculoskeletal: Denies decrease in range of motion, difficulty with gait, muscle pain or joint pain and swelling.  Skin: Denies redness, rashes, lesions or ulcercations.  Neurological: Patient reports insomnia and restless legs.  Denies dizziness, difficulty with memory, difficulty with speech or problems with balance and coordination.  Psych: Patient has a history of anxiety and depression.  Denies SI/HI.  No other specific complaints in a complete review of systems (except as listed in HPI above).     Objective:   Physical Exam BP 128/84   Ht 5\' 2"  (1.575 m)   Wt 126 lb (57.2 kg)   BMI 23.05 kg/m    Wt Readings from Last 3 Encounters:  03/17/23 121 lb 0.5 oz (54.9 kg)  01/04/23 121 lb (54.9 kg)  09/16/22 122 lb 12.8 oz (55.7 kg)    General: Appears her stated age, well developed, well nourished in NAD. Skin: Warm, dry and intact.  HEENT: Head: normal shape and size; Eyes: sclera white, no icterus, conjunctiva pink, PERRLA and EOMs intact;  Neck:  Neck supple, trachea midline. No masses, lumps or thyromegaly present.  Cardiovascular: Normal rate and rhythm. S1,S2 noted.  No murmur, rubs or gallops noted. No JVD or BLE edema. No carotid bruits noted. Pulmonary/Chest: Normal effort and coarse vesicular breath sounds. No respiratory distress. No wheezes, rales or ronchi noted.  Abdomen: Soft and nontender. Normal bowel sounds.  Musculoskeletal: Strength 5/5 BUE/BLE.  No difficulty with gait.  Neurological: Alert and oriented. Cranial nerves II-XII grossly  intact. Coordination normal.  Psychiatric: Mood and affect normal. Behavior is normal. Judgment and thought content normal.    BMET    Component Value Date/Time   NA 140 04/11/2023 0756   K 4.6 04/11/2023 0756   CL 104 04/11/2023 0756   CO2 30 04/11/2023 0756   GLUCOSE 85 04/11/2023 0756   BUN 8 04/11/2023 0756   CREATININE 0.69 04/11/2023 0756   CALCIUM 9.2 04/11/2023 0756   GFRNONAA >60 03/17/2023 0959   GFRNONAA 98 12/12/2019 0840   GFRAA >60 01/03/2020 1210   GFRAA 113 12/12/2019 0840    Lipid Panel     Component Value Date/Time  CHOL 215 (H) 04/11/2023 0756   TRIG 100 04/11/2023 0756   HDL 80 04/11/2023 0756   CHOLHDL 2.7 04/11/2023 0756   VLDL 12 04/03/2017 0800   LDLCALC 114 (H) 04/11/2023 0756    CBC    Component Value Date/Time   WBC 5.9 03/17/2023 0959   RBC 5.24 (H) 03/17/2023 0959   HGB 15.1 (H) 03/17/2023 0959   HGB 14.2 08/18/2021 1543   HCT 44.4 03/17/2023 0959   HCT 40.9 08/18/2021 1543   PLT 235 03/17/2023 0959   MCV 84.7 03/17/2023 0959   MCH 28.8 03/17/2023 0959   MCHC 34.0 03/17/2023 0959   RDW 13.0 03/17/2023 0959   LYMPHSABS 1.2 08/16/2022 1544   MONOABS 1.6 (H) 08/16/2022 1544   EOSABS 0.0 08/16/2022 1544   BASOSABS 0.0 08/16/2022 1544    Hgb A1C Lab Results  Component Value Date   HGBA1C 5.5 01/04/2023            Assessment & Plan:   Preventative Health Maintenance:  She declines flu shot today Tetanus UTD per her report Encouraged her to get her COVID booster Discussed Shingrix vaccine, she will check coverage with her insurance company and schedule a nurse visit if she would like to have this done She no longer needs Pap smears Mammogram declined at this time Bone density UTD Colon screening UTD CT lung cancer screening UTD Encouraged her to consume a balanced diet and exercise regimen Advised her to see an eye doctor and dentist annually We will check CBC, c-Met, lipid profile and A1c today   RTC in 6  months, follow-up chronic conditions Nicki Reaper, NP

## 2023-07-07 NOTE — Patient Instructions (Signed)
Health Maintenance for Postmenopausal Women Menopause is a normal process in which your ability to get pregnant comes to an end. This process happens slowly over many months or years, usually between the ages of 48 and 55. Menopause is complete when you have missed your menstrual period for 12 months. It is important to talk with your health care provider about some of the most common conditions that affect women after menopause (postmenopausal women). These include heart disease, cancer, and bone loss (osteoporosis). Adopting a healthy lifestyle and getting preventive care can help to promote your health and wellness. The actions you take can also lower your chances of developing some of these common conditions. What are the signs and symptoms of menopause? During menopause, you may have the following symptoms: Hot flashes. These can be moderate or severe. Night sweats. Decrease in sex drive. Mood swings. Headaches. Tiredness (fatigue). Irritability. Memory problems. Problems falling asleep or staying asleep. Talk with your health care provider about treatment options for your symptoms. Do I need hormone replacement therapy? Hormone replacement therapy is effective in treating symptoms that are caused by menopause, such as hot flashes and night sweats. Hormone replacement carries certain risks, especially as you become older. If you are thinking about using estrogen or estrogen with progestin, discuss the benefits and risks with your health care provider. How can I reduce my risk for heart disease and stroke? The risk of heart disease, heart attack, and stroke increases as you age. One of the causes may be a change in the body's hormones during menopause. This can affect how your body uses dietary fats, triglycerides, and cholesterol. Heart attack and stroke are medical emergencies. There are many things that you can do to help prevent heart disease and stroke. Watch your blood pressure High  blood pressure causes heart disease and increases the risk of stroke. This is more likely to develop in people who have high blood pressure readings or are overweight. Have your blood pressure checked: Every 3-5 years if you are 18-39 years of age. Every year if you are 40 years old or older. Eat a healthy diet  Eat a diet that includes plenty of vegetables, fruits, low-fat dairy products, and lean protein. Do not eat a lot of foods that are high in solid fats, added sugars, or sodium. Get regular exercise Get regular exercise. This is one of the most important things you can do for your health. Most adults should: Try to exercise for at least 150 minutes each week. The exercise should increase your heart rate and make you sweat (moderate-intensity exercise). Try to do strengthening exercises at least twice each week. Do these in addition to the moderate-intensity exercise. Spend less time sitting. Even light physical activity can be beneficial. Other tips Work with your health care provider to achieve or maintain a healthy weight. Do not use any products that contain nicotine or tobacco. These products include cigarettes, chewing tobacco, and vaping devices, such as e-cigarettes. If you need help quitting, ask your health care provider. Know your numbers. Ask your health care provider to check your cholesterol and your blood sugar (glucose). Continue to have your blood tested as directed by your health care provider. Do I need screening for cancer? Depending on your health history and family history, you may need to have cancer screenings at different stages of your life. This may include screening for: Breast cancer. Cervical cancer. Lung cancer. Colorectal cancer. What is my risk for osteoporosis? After menopause, you may be   at increased risk for osteoporosis. Osteoporosis is a condition in which bone destruction happens more quickly than new bone creation. To help prevent osteoporosis or  the bone fractures that can happen because of osteoporosis, you may take the following actions: If you are 19-50 years old, get at least 1,000 mg of calcium and at least 600 international units (IU) of vitamin D per day. If you are older than age 50 but younger than age 70, get at least 1,200 mg of calcium and at least 600 international units (IU) of vitamin D per day. If you are older than age 70, get at least 1,200 mg of calcium and at least 800 international units (IU) of vitamin D per day. Smoking and drinking excessive alcohol increase the risk of osteoporosis. Eat foods that are rich in calcium and vitamin D, and do weight-bearing exercises several times each week as directed by your health care provider. How does menopause affect my mental health? Depression may occur at any age, but it is more common as you become older. Common symptoms of depression include: Feeling depressed. Changes in sleep patterns. Changes in appetite or eating patterns. Feeling an overall lack of motivation or enjoyment of activities that you previously enjoyed. Frequent crying spells. Talk with your health care provider if you think that you are experiencing any of these symptoms. General instructions See your health care provider for regular wellness exams and vaccines. This may include: Scheduling regular health, dental, and eye exams. Getting and maintaining your vaccines. These include: Influenza vaccine. Get this vaccine each year before the flu season begins. Pneumonia vaccine. Shingles vaccine. Tetanus, diphtheria, and pertussis (Tdap) booster vaccine. Your health care provider may also recommend other immunizations. Tell your health care provider if you have ever been abused or do not feel safe at home. Summary Menopause is a normal process in which your ability to get pregnant comes to an end. This condition causes hot flashes, night sweats, decreased interest in sex, mood swings, headaches, or lack  of sleep. Treatment for this condition may include hormone replacement therapy. Take actions to keep yourself healthy, including exercising regularly, eating a healthy diet, watching your weight, and checking your blood pressure and blood sugar levels. Get screened for cancer and depression. Make sure that you are up to date with all your vaccines. This information is not intended to replace advice given to you by your health care provider. Make sure you discuss any questions you have with your health care provider. Document Revised: 12/28/2020 Document Reviewed: 12/28/2020 [employer]vier Patient Education  2024 [employer]  

## 2023-07-08 LAB — CBC
HCT: 45.1 % — ABNORMAL HIGH (ref 35.0–45.0)
Hemoglobin: 14.9 g/dL (ref 11.7–15.5)
MCH: 28.5 pg (ref 27.0–33.0)
MCHC: 33 g/dL (ref 32.0–36.0)
MCV: 86.2 fL (ref 80.0–100.0)
MPV: 9.6 fL (ref 7.5–12.5)
Platelets: 285 10*3/uL (ref 140–400)
RBC: 5.23 10*6/uL — ABNORMAL HIGH (ref 3.80–5.10)
RDW: 12.6 % (ref 11.0–15.0)
WBC: 8.6 10*3/uL (ref 3.8–10.8)

## 2023-07-08 LAB — COMPLETE METABOLIC PANEL WITH GFR
AG Ratio: 1.9 (calc) (ref 1.0–2.5)
ALT: 4 U/L — ABNORMAL LOW (ref 6–29)
AST: 14 U/L (ref 10–35)
Albumin: 4.3 g/dL (ref 3.6–5.1)
Alkaline phosphatase (APISO): 82 U/L (ref 37–153)
BUN: 11 mg/dL (ref 7–25)
CO2: 27 mmol/L (ref 20–32)
Calcium: 9.2 mg/dL (ref 8.6–10.4)
Chloride: 104 mmol/L (ref 98–110)
Creat: 0.65 mg/dL (ref 0.50–1.05)
Globulin: 2.3 g/dL (ref 1.9–3.7)
Glucose, Bld: 75 mg/dL (ref 65–139)
Potassium: 4 mmol/L (ref 3.5–5.3)
Sodium: 140 mmol/L (ref 135–146)
Total Bilirubin: 0.5 mg/dL (ref 0.2–1.2)
Total Protein: 6.6 g/dL (ref 6.1–8.1)
eGFR: 99 mL/min/{1.73_m2} (ref >=60)

## 2023-07-08 LAB — LIPID PANEL
Cholesterol: 195 mg/dL (ref <200)
HDL: 82 mg/dL (ref >=50)
LDL Cholesterol (Calc): 87 mg/dL
Non-HDL Cholesterol (Calc): 113 mg/dL (ref <130)
Total CHOL/HDL Ratio: 2.4 (calc) (ref <5.0)
Triglycerides: 159 mg/dL — ABNORMAL HIGH (ref <150)

## 2023-07-08 LAB — HEMOGLOBIN A1C
Hgb A1c MFr Bld: 5.4 %{Hb} (ref <5.7)
Mean Plasma Glucose: 108 mg/dL
eAG (mmol/L): 6 mmol/L

## 2023-08-28 ENCOUNTER — Encounter: Payer: Self-pay | Admitting: Internal Medicine

## 2023-08-28 ENCOUNTER — Ambulatory Visit (INDEPENDENT_AMBULATORY_CARE_PROVIDER_SITE_OTHER): Payer: BC Managed Care – PPO | Admitting: Internal Medicine

## 2023-08-28 VITALS — BP 118/78 | HR 88 | Ht 62.0 in | Wt 127.0 lb

## 2023-08-28 DIAGNOSIS — J441 Chronic obstructive pulmonary disease with (acute) exacerbation: Secondary | ICD-10-CM | POA: Diagnosis not present

## 2023-08-28 DIAGNOSIS — J00 Acute nasopharyngitis [common cold]: Secondary | ICD-10-CM

## 2023-08-28 MED ORDER — METHYLPREDNISOLONE ACETATE 80 MG/ML IJ SUSP
80.0000 mg | Freq: Once | INTRAMUSCULAR | Status: AC
Start: 2023-08-28 — End: 2023-08-28
  Administered 2023-08-28: 80 mg via INTRAMUSCULAR

## 2023-08-28 MED ORDER — AZITHROMYCIN 250 MG PO TABS: ORAL_TABLET | ORAL | 0 refills | Status: DC

## 2023-08-28 NOTE — Patient Instructions (Signed)

## 2023-08-28 NOTE — Progress Notes (Signed)
 Subjective:    Patient ID: Molly Banks, female    DOB: 07/09/1961, 63 y.o.   MRN: [id number]  HPI  Discussed the use of AI scribe software for clinical note transcription with the patient, who gave verbal consent to proceed.  History of Present Illness   The patient, with a history of COPD, has been experiencing symptoms of an upper respiratory infection for the past three weeks. Despite self-medication with over-the-counter remedies such as DayQuil, Nyquil, and rheumatoid, the symptoms have persisted. She reports experiencing headaches, nasal congestion, a runny nose, coughing, ear pain, and a sore throat. She denies any significant shortness of breath unless engaged in strenuous activities such as walking uphill.  The patient also reports a brief episode of diarrhea lasting approximately twelve hours, during which her bowel movements were green. She experienced some nausea but did not vomit. She denies having a fever but reports body aches.  The patient works at [employer] and believes she contracted the illness there, as she does not frequent other places and no one else in her household is currently ill. She has been adhering to her COPD medication regimen, which includes Breztri  and albuterol .  The patient's symptoms seem to fluctuate, with some days feeling well and others feeling significantly unwell. She reports that the illness seems to have hit her harder recently.       Review of Systems   Past Medical History:  Diagnosis Date   Arthritis    Breast lump in female 2005   COPD (chronic obstructive pulmonary disease) (HCC)    Emphysema lung (HCC)    Erythrocytosis    Frequent headaches    tension related   GERD (gastroesophageal reflux disease)    h/o   Hypoglycemia    Hypoglycemia    Leukocytosis    Thoracic scoliosis    Left concavity   Thyroid  nodule    right side    Current Outpatient Medications  Medication Sig Dispense Refill   albuterol  (VENTOLIN  HFA) 108 (90  Base) MCG/ACT inhaler Inhale 2 puffs into the lungs every 6 (six) hours as needed for wheezing or shortness of breath. 8 g 0   aspirin  EC 81 MG tablet Take 1 tablet (81 mg total) by mouth daily. Swallow whole. 30 tablet 12   Budeson-Glycopyrrol-Formoterol (BREZTRI  AEROSPHERE) 160-9-4.8 MCG/ACT AERO Inhale 2 puffs into the lungs 2 (two) times daily. 10.7 g 11   busPIRone  (BUSPAR ) 10 MG tablet Take 1 tablet (10 mg total) by mouth 2 (two) times daily. 180 tablet 1   ibuprofen  (ADVIL ) 800 MG tablet Take 800 mg by mouth every 8 (eight) hours as needed.     Mag Oxide-Vit D3-Turmeric (MAGNESIUM-VITAMIN D3-TURMERIC) [phone] MG-UNIT-MG TABS Take by mouth.     Magnesium 400 MG TABS Take 1 tablet by mouth at bedtime.     simvastatin  (ZOCOR ) 10 MG tablet Take 1 tablet (10 mg total) by mouth at bedtime. 90 tablet 1   No current facility-administered medications for this visit.    Allergies  Allergen Reactions   Guaifenesin Shortness Of Breath and Itching   Tusso-Df [Hydrocodone -Guaifenesin] Shortness Of Breath   Mobic  [Meloxicam ] Other (See Comments)    headache    Family History  Problem Relation Age of Onset   Brain cancer Mother    Cancer Mother        brain   Hiatal hernia Mother    Heart attack Father 67   Lung cancer Father    COPD Father  Cancer - Lung Father    Lymphoma Sister    Breast cancer Sister 53       BRCA positive test   Breast cancer Maternal Grandmother    Lung cancer Maternal Grandfather    Breast cancer Daughter 64   Ovarian cancer Neg Hx    Colon cancer Neg Hx    Stroke Neg Hx    Heart disease Neg Hx     Social History   Socioeconomic History   Marital status: Married    Spouse name: Not on file   Number of children: Not on file   Years of education: Not on file   Highest education level: Not on file  Occupational History   Not on file  Tobacco Use   Smoking status: Every Day    Current packs/day: 0.50    Average packs/day: 0.5 packs/day for 30.0  years (15.0 ttl pk-yrs)    Types: Cigarettes   Smokeless tobacco: Former   Tobacco comments:    14, 20s  Vaping Use   Vaping status: Former   Substances: Nicotine , Flavoring  Substance and Sexual Activity   Alcohol use: No   Drug use: No   Sexual activity: Not on file  Other Topics Concern   Not on file  Social History Narrative   Not on file   Social Drivers of Health   Financial Resource Strain: Low Risk  (07/07/2023)   Overall Financial Resource Strain (CARDIA)    Difficulty of Paying Living Expenses: Not hard at all  Food Insecurity: Food Insecurity Present (07/07/2023)   Hunger Vital Sign    Worried About Running Out of Food in the Last Year: Sometimes true    Ran Out of Food in the Last Year: Never true  Transportation Needs: No Transportation Needs (07/07/2023)   PRAPARE - Administrator, Civil Service (Medical): No    Lack of Transportation (Non-Medical): No  Physical Activity: Sufficiently Active (07/07/2023)   Exercise Vital Sign    Days of Exercise per Week: 7 days    Minutes of Exercise per Session: 150+ min  Stress: No Stress Concern Present (07/07/2023)   [employer] of Occupational Health - Occupational Stress Questionnaire    Feeling of Stress : Not at all  Social Connections: Moderately Integrated (07/07/2023)   Social Connection and Isolation Panel [NHANES]    Frequency of Communication with Friends and Family: More than three times a week    Frequency of Social Gatherings with Friends and Family: More than three times a week    Attends Religious Services: 1 to 4 times per year    Active Member of [employer] or Organizations: No    Attends Banker Meetings: Never    Marital Status: Married  Catering Manager Violence: Not At Risk (07/07/2023)   Humiliation, Afraid, Rape, and Kick questionnaire    Fear of Current or Ex-Partner: No    Emotionally Abused: No    Physically Abused: No    Sexually Abused: No     Constitutional:  Pt reports headache. Denies fever, malaise, fatigue, or abrupt weight changes.  HEENT: Pt reports runny nose, nasal congestion, ear pain and sore throat. Denies eye pain, eye redness, ringing in the ears, wax buildup, bloody nose. Respiratory: Pt reports cough and shortness of breath. Denies difficulty breathing.   Cardiovascular: Denies chest pain, chest tightness, palpitations or swelling in the hands or feet.  Gastrointestinal: Pt reports nausea and diarrhea. Denies abdominal pain, bloating, constipation, or blood in  the stool.  GU: Denies urgency, frequency, pain with urination, burning sensation, blood in urine, odor or discharge. Musculoskeletal: Pt reports body aches. Denies decrease in range of motion, difficulty with gait, or joint  swelling.  Skin: Denies redness, rashes, lesions or ulcercations.  Neurological: Denies dizziness, difficulty with memory, difficulty with speech or problems with balance and coordination.  Psych: Denies anxiety, depression, SI/HI.  No other specific complaints in a complete review of systems (except as listed in HPI above).      Objective:   Physical Exam  BP 118/78 (BP Location: Left Arm, Patient Position: Sitting)   Pulse 88   Ht 5' 2 (1.575 m)   Wt 127 lb (57.6 kg)   SpO2 95%   BMI 23.23 kg/m   Wt Readings from Last 3 Encounters:  07/07/23 126 lb (57.2 kg)  03/17/23 121 lb 0.5 oz (54.9 kg)  01/04/23 121 lb (54.9 kg)    General: Appears her stated age, appears unwell but in NAD. Skin: Warm, dry and intact. No rashes noted. HEENT: Head: normal shape and size, no sinus tenderness noted; Eyes: sclera white, no icterus, conjunctiva pink, PERRLA and EOMs intact; Ears: Tm's gray and intact, normal light reflex; Nose: mucosa pink and moist, septum midline; Throat/Mouth: Teeth present, mucosa erythematous and moist, + PND, no exudate, lesions or ulcerations noted.  Neck: No adenopathy noted. Cardiovascular: Normal rate and rhythm. S1,S2 noted.  No  murmur, rubs or gallops noted. No JVD or BLE edema. No carotid bruits noted. Pulmonary/Chest: Normal effort and positive vesicular breath sounds with intermittent wheezing noted bilaterally and rhonchi in the LLL.SABRA No respiratory distress. No rales noted.  Abdomen: Soft and nontender.  Musculoskeletal:  No difficulty with gait.  Neurological: Alert and oriented.   BMET    Component Value Date/Time   NA 140 07/07/2023 1441   K 4.0 07/07/2023 1441   CL 104 07/07/2023 1441   CO2 27 07/07/2023 1441   GLUCOSE 75 07/07/2023 1441   BUN 11 07/07/2023 1441   CREATININE 0.65 07/07/2023 1441   CALCIUM 9.2 07/07/2023 1441   GFRNONAA >60 03/17/2023 0959   GFRNONAA 98 12/12/2019 0840   GFRAA >60 01/03/2020 1210   GFRAA 113 12/12/2019 0840    Lipid Panel     Component Value Date/Time   CHOL 195 07/07/2023 1441   TRIG 159 (H) 07/07/2023 1441   HDL 82 07/07/2023 1441   CHOLHDL 2.4 07/07/2023 1441   VLDL 12 04/03/2017 0800   LDLCALC 87 07/07/2023 1441    CBC    Component Value Date/Time   WBC 8.6 07/07/2023 1441   RBC 5.23 (H) 07/07/2023 1441   HGB 14.9 07/07/2023 1441   HGB 14.2 08/18/2021 1543   HCT 45.1 (H) 07/07/2023 1441   HCT 40.9 08/18/2021 1543   PLT 285 07/07/2023 1441   MCV 86.2 07/07/2023 1441   MCH 28.5 07/07/2023 1441   MCHC 33.0 07/07/2023 1441   RDW 12.6 07/07/2023 1441   LYMPHSABS 1.2 08/16/2022 1544   MONOABS 1.6 (H) 08/16/2022 1544   EOSABS 0.0 08/16/2022 1544   BASOSABS 0.0 08/16/2022 1544    Hgb A1C Lab Results  Component Value Date   HGBA1C 5.4 07/07/2023            Assessment & Plan:  Assessment and Plan    Upper Respiratory Infection (URI) with COPD exacerbation Symptoms of URI for three weeks with intermittent improvement and worsening. No shortness of breath unless exerting. No fever. COPD exacerbation suspected based  on physical exam. -Administer 80mg  of Depo Medrol  today. -Prescribe Azithromycin  (Z-Pak) to be picked up at [facility],  [address]. -Continue current COPD medications (Breztri  and Albuterol ).    RTC in 4 months for follow-up of chronic conditions Angeline Laura, NP

## 2023-10-02 ENCOUNTER — Telehealth: Payer: Self-pay | Admitting: Internal Medicine

## 2023-10-02 NOTE — Telephone Encounter (Signed)
 Pt is calling in because she says she would like to try the nicotine  patches to help her give up smoking. Pt says she smokes a pack a day and would like for [facility] to call them in to [facility] on [address].

## 2023-10-03 ENCOUNTER — Encounter: Payer: Self-pay | Admitting: Internal Medicine

## 2023-10-03 ENCOUNTER — Ambulatory Visit: Payer: BC Managed Care – PPO | Admitting: Internal Medicine

## 2023-10-03 VITALS — BP 110/72 | Ht 62.0 in | Wt 126.8 lb

## 2023-10-03 DIAGNOSIS — Z716 Tobacco abuse counseling: Secondary | ICD-10-CM | POA: Diagnosis not present

## 2023-10-03 DIAGNOSIS — J410 Simple chronic bronchitis: Secondary | ICD-10-CM

## 2023-10-03 MED ORDER — NICOTINE 21 MG/24HR TD PT24: 21.0000 mg | MEDICATED_PATCH | Freq: Every day | TRANSDERMAL | 0 refills | Status: DC

## 2023-10-03 NOTE — Patient Instructions (Signed)

## 2023-10-03 NOTE — Progress Notes (Signed)
Subjective:    Patient ID: Molly Banks, female    DOB: 1960-09-21, 63 y.o.   MRN: [id number]  HPI  Discussed the use of AI scribe software for clinical note transcription with the patient, who gave verbal consent to proceed.  History of Present Illness   Molly Banks is a 63 year old female who presents for smoking cessation.  She has been smoking for approximately forty years and currently smokes about a pack of cigarettes per day. She has previously attempted to quit smoking using over-the-counter nicotine patches, which were ineffective.  She is interested in trying a prescription nicotine patch, similar to what her sister is using, to aid in smoking cessation.  She has a persistent cough, which she attributes to both her smoking and the weather. She recalls being sick previously and mentions that she took all the medication prescribed to her at that time.  She has a history of undergoing lung cancer screening, with the last screening performed in June of the previous year.       Review of Systems  Past Medical History:  Diagnosis Date   Arthritis    Breast lump in female 2005   COPD (chronic obstructive pulmonary disease) (HCC)    Emphysema lung (HCC)    Erythrocytosis    Frequent headaches    tension related   GERD (gastroesophageal reflux disease)    h/o   Hypoglycemia    Hypoglycemia    Leukocytosis    Thoracic scoliosis    Left concavity   Thyroid nodule    right side    Current Outpatient Medications  Medication Sig Dispense Refill   albuterol (VENTOLIN HFA) 108 (90 Base) MCG/ACT inhaler Inhale 2 puffs into the lungs every 6 (six) hours as needed for wheezing or shortness of breath. 8 g 0   aspirin EC 81 MG tablet Take 1 tablet (81 mg total) by mouth daily. Swallow whole. 30 tablet 12   azithromycin (ZITHROMAX) 250 MG tablet Take 2 tabs today, then 1 tab daily x 4 days 6 tablet 0   Budeson-Glycopyrrol-Formoterol (BREZTRI AEROSPHERE) 160-9-4.8 MCG/ACT AERO  Inhale 2 puffs into the lungs 2 (two) times daily. (Patient not taking: Reported on 08/28/2023) 10.7 g 11   busPIRone (BUSPAR) 10 MG tablet Take 1 tablet (10 mg total) by mouth 2 (two) times daily. 180 tablet 1   ibuprofen (ADVIL) 800 MG tablet Take 800 mg by mouth every 8 (eight) hours as needed.     simvastatin (ZOCOR) 10 MG tablet Take 1 tablet (10 mg total) by mouth at bedtime. 90 tablet 1   No current facility-administered medications for this visit.    Allergies  Allergen Reactions   Guaifenesin Shortness Of Breath and Itching   Tusso-Df [Hydrocodone-Guaifenesin] Shortness Of Breath   Mobic [Meloxicam] Other (See Comments)    headache    Family History  Problem Relation Age of Onset   Brain cancer Mother    Cancer Mother        brain   Hiatal hernia Mother    Heart attack Father 61   Lung cancer Father    COPD Father    Cancer - Lung Father    Lymphoma Sister    Breast cancer Sister 74       BRCA positive test   Breast cancer Maternal Grandmother    Lung cancer Maternal Grandfather    Breast cancer Daughter 9   Ovarian cancer Neg Hx    Colon cancer Neg Hx  Stroke Neg Hx    Heart disease Neg Hx     Social History   Socioeconomic History   Marital status: Married    Spouse name: Not on file   Number of children: Not on file   Years of education: Not on file   Highest education level: Not on file  Occupational History   Not on file  Tobacco Use   Smoking status: Every Day    Current packs/day: 0.50    Average packs/day: 0.5 packs/day for 30.0 years (15.0 ttl pk-yrs)    Types: Cigarettes   Smokeless tobacco: Former   Tobacco comments:    14, 20s  Vaping Use   Vaping status: Former   Substances: Nicotine, Flavoring  Substance and Sexual Activity   Alcohol use: No   Drug use: No   Sexual activity: Not on file  Other Topics Concern   Not on file  Social History Narrative   Not on file   Social Drivers of Health   Financial Resource Strain: Low  Risk  (07/07/2023)   Overall Financial Resource Strain (CARDIA)    Difficulty of Paying Living Expenses: Not hard at all  Food Insecurity: Food Insecurity Present (07/07/2023)   Hunger Vital Sign    Worried About Running Out of Food in the Last Year: Sometimes true    Ran Out of Food in the Last Year: Never true  Transportation Needs: No Transportation Needs (07/07/2023)   PRAPARE - Administrator, Civil Service (Medical): No    Lack of Transportation (Non-Medical): No  Physical Activity: Sufficiently Active (07/07/2023)   Exercise Vital Sign    Days of Exercise per Week: 7 days    Minutes of Exercise per Session: 150+ min  Stress: No Stress Concern Present (07/07/2023)   [employer] of Occupational Health - Occupational Stress Questionnaire    Feeling of Stress : Not at all  Social Connections: Moderately Integrated (07/07/2023)   Social Connection and Isolation Panel [NHANES]    Frequency of Communication with Friends and Family: More than three times a week    Frequency of Social Gatherings with Friends and Family: More than three times a week    Attends Religious Services: 1 to 4 times per year    Active Member of [employer] or Organizations: No    Attends Banker Meetings: Never    Marital Status: Married  Catering manager Violence: Not At Risk (07/07/2023)   Humiliation, Afraid, Rape, and Kick questionnaire    Fear of Current or Ex-Partner: No    Emotionally Abused: No    Physically Abused: No    Sexually Abused: No     Constitutional: Patient reports intermittent headaches.  Denies fever, malaise, fatigue, or abrupt weight changes.  HEENT: Denies eye pain, eye redness, ear pain, ringing in the ears, wax buildup, runny nose, nasal congestion, bloody nose, or sore throat. Respiratory: Patient reports cough.  Denies difficulty breathing, shortness of breath.   Cardiovascular: Denies chest pain, chest tightness, palpitations or swelling in the hands  or feet.   No other specific complaints in a complete review of systems (except as listed in HPI above).     Objective:   Physical Exam BP 110/72 (BP Location: Right Arm, Patient Position: Sitting, Cuff Size: Normal)   Ht 5\' 2"  (1.575 m)   Wt 126 lb 12.8 oz (57.5 kg)   BMI 23.19 kg/m    Wt Readings from Last 3 Encounters:  08/28/23 127 lb (57.6 kg)  07/07/23 126 lb (57.2 kg)  03/17/23 121 lb 0.5 oz (54.9 kg)    General: Appears her stated age, well developed, well nourished in NAD. HEENT: Head: normal shape and size;  Cardiovascular: Normal rate and rhythm. S1,S2 noted.  No murmur, rubs or gallops noted. No JVD or BLE edema. No carotid bruits noted. Pulmonary/Chest: Normal effort and positive vesicular breath sounds. No respiratory distress. No wheezes, rales or ronchi noted.  Neurological: Alert and oriented.   BMET    Component Value Date/Time   NA 140 07/07/2023 1441   K 4.0 07/07/2023 1441   CL 104 07/07/2023 1441   CO2 27 07/07/2023 1441   GLUCOSE 75 07/07/2023 1441   BUN 11 07/07/2023 1441   CREATININE 0.65 07/07/2023 1441   CALCIUM 9.2 07/07/2023 1441   GFRNONAA >60 03/17/2023 0959   GFRNONAA 98 12/12/2019 0840   GFRAA >60 01/03/2020 1210   GFRAA 113 12/12/2019 0840    Lipid Panel     Component Value Date/Time   CHOL 195 07/07/2023 1441   TRIG 159 (H) 07/07/2023 1441   HDL 82 07/07/2023 1441   CHOLHDL 2.4 07/07/2023 1441   VLDL 12 04/03/2017 0800   LDLCALC 87 07/07/2023 1441    CBC    Component Value Date/Time   WBC 8.6 07/07/2023 1441   RBC 5.23 (H) 07/07/2023 1441   HGB 14.9 07/07/2023 1441   HGB 14.2 08/18/2021 1543   HCT 45.1 (H) 07/07/2023 1441   HCT 40.9 08/18/2021 1543   PLT 285 07/07/2023 1441   MCV 86.2 07/07/2023 1441   MCH 28.5 07/07/2023 1441   MCHC 33.0 07/07/2023 1441   RDW 12.6 07/07/2023 1441   LYMPHSABS 1.2 08/16/2022 1544   MONOABS 1.6 (H) 08/16/2022 1544   EOSABS 0.0 08/16/2022 1544   BASOSABS 0.0 08/16/2022 1544     Hgb A1C Lab Results  Component Value Date   HGBA1C 5.4 07/07/2023            Assessment & Plan:  Assessment and Plan    Tobacco Use Disorder, COPD 40+ year history of smoking, currently 1 pack per day. Previous unsuccessful quit attempts with over-the-counter patches. Willing to try prescription strength patches. -Start Nicoderm CQ 21mg  patch daily for 6 weeks. -After 6 weeks, decrease to Nicoderm CQ 14mg  patch daily for 2 weeks. -After 2 weeks, decrease to Nicoderm CQ 7mg  patch daily for 2 weeks. -Notify provider when ready for next dose.  Lung Cancer Screening Last screening in June of the previous year. Due for next screening in June of the current year. -Continue annual lung cancer screenings as scheduled.    RTC in 2 months, follow-up chronic conditions Nicki Reaper, NP

## 2024-01-01 ENCOUNTER — Ambulatory Visit: Payer: Self-pay | Admitting: Internal Medicine

## 2024-01-08 ENCOUNTER — Encounter: Payer: Self-pay | Admitting: Internal Medicine

## 2024-01-08 ENCOUNTER — Ambulatory Visit: Admitting: Internal Medicine

## 2024-01-08 VITALS — BP 110/72 | Ht 62.0 in | Wt 121.4 lb

## 2024-01-08 DIAGNOSIS — F5101 Primary insomnia: Secondary | ICD-10-CM

## 2024-01-08 DIAGNOSIS — E7800 Pure hypercholesterolemia, unspecified: Secondary | ICD-10-CM | POA: Diagnosis not present

## 2024-01-08 DIAGNOSIS — J410 Simple chronic bronchitis: Secondary | ICD-10-CM | POA: Diagnosis not present

## 2024-01-08 DIAGNOSIS — F32A Depression, unspecified: Secondary | ICD-10-CM

## 2024-01-08 DIAGNOSIS — D751 Secondary polycythemia: Secondary | ICD-10-CM

## 2024-01-08 DIAGNOSIS — K219 Gastro-esophageal reflux disease without esophagitis: Secondary | ICD-10-CM | POA: Diagnosis not present

## 2024-01-08 DIAGNOSIS — I700 Atherosclerosis of aorta: Secondary | ICD-10-CM | POA: Diagnosis not present

## 2024-01-08 DIAGNOSIS — F419 Anxiety disorder, unspecified: Secondary | ICD-10-CM

## 2024-01-08 DIAGNOSIS — N3281 Overactive bladder: Secondary | ICD-10-CM

## 2024-01-08 DIAGNOSIS — G2581 Restless legs syndrome: Secondary | ICD-10-CM

## 2024-01-08 DIAGNOSIS — R519 Headache, unspecified: Secondary | ICD-10-CM

## 2024-01-08 DIAGNOSIS — L301 Dyshidrosis [pompholyx]: Secondary | ICD-10-CM

## 2024-01-08 DIAGNOSIS — R739 Hyperglycemia, unspecified: Secondary | ICD-10-CM

## 2024-01-08 DIAGNOSIS — M8589 Other specified disorders of bone density and structure, multiple sites: Secondary | ICD-10-CM

## 2024-01-08 LAB — COMPREHENSIVE METABOLIC PANEL WITH GFR
AG Ratio: 2.1 (calc) (ref 1.0–2.5)
ALT: 7 U/L (ref 6–29)
AST: 17 U/L (ref 10–35)
Albumin: 4.2 g/dL (ref 3.6–5.1)
Alkaline phosphatase (APISO): 70 U/L (ref 37–153)
BUN: 7 mg/dL (ref 7–25)
CO2: 26 mmol/L (ref 20–32)
Calcium: 9.2 mg/dL (ref 8.6–10.4)
Chloride: 106 mmol/L (ref 98–110)
Creat: 0.65 mg/dL (ref 0.50–1.05)
Globulin: 2 g/dL (ref 1.9–3.7)
Glucose, Bld: 70 mg/dL (ref 65–139)
Potassium: 4.1 mmol/L (ref 3.5–5.3)
Sodium: 142 mmol/L (ref 135–146)
Total Bilirubin: 0.5 mg/dL (ref 0.2–1.2)
Total Protein: 6.2 g/dL (ref 6.1–8.1)
eGFR: 99 mL/min/{1.73_m2} (ref >=60)

## 2024-01-08 LAB — CBC
HCT: 45.6 % — ABNORMAL HIGH (ref 35.0–45.0)
Hemoglobin: 14.7 g/dL (ref 11.7–15.5)
MCH: 28.3 pg (ref 27.0–33.0)
MCHC: 32.2 g/dL (ref 32.0–36.0)
MCV: 87.9 fL (ref 80.0–100.0)
MPV: 9.4 fL (ref 7.5–12.5)
Platelets: 310 10*3/uL (ref 140–400)
RBC: 5.19 10*6/uL — ABNORMAL HIGH (ref 3.80–5.10)
RDW: 12.5 % (ref 11.0–15.0)
WBC: 8.5 10*3/uL (ref 3.8–10.8)

## 2024-01-08 LAB — HEMOGLOBIN A1C
Hgb A1c MFr Bld: 5.4 % (ref <5.7)
Mean Plasma Glucose: 108 mg/dL
eAG (mmol/L): 6 mmol/L

## 2024-01-08 LAB — LIPID PANEL
Cholesterol: 185 mg/dL (ref <200)
HDL: 65 mg/dL (ref >=50)
LDL Cholesterol (Calc): 96 mg/dL
Non-HDL Cholesterol (Calc): 120 mg/dL (ref <130)
Total CHOL/HDL Ratio: 2.8 (calc) (ref <5.0)
Triglycerides: 147 mg/dL (ref <150)

## 2024-01-08 NOTE — Assessment & Plan Note (Signed)
 Continue ibuprofen as needed.

## 2024-01-08 NOTE — Patient Instructions (Signed)
 Itchy Skin Blisters (Dyshidrotic Eczema): What to Know Dyshidrotic eczema, also called pompholyx, is a type of skin condition. It causes very itchy blisters on the hands and feet. It's more common before age 63, but it can affect people of any age. There's no cure, but treatments can help relieve symptoms. What are the causes? The cause of this condition isn't known. What increases the risk? You're more likely to get this condition if: You wash your hands a lot. You have a personal or family history of eczema, allergies, asthma, or hay fever. You have allergies to metals like nickel or cobalt. You work with skin irritants like detergents or cement. You smoke. You're often stressed. You have an immune system condition. What are the signs or symptoms? Symptoms may come and go and often affect your hands or feet. Symptoms include: Severe itching, often before blisters show up. Blisters that can form suddenly. At first, the blisters may form near the fingertips. In severe cases, blisters may grow to large blister masses. Blisters go away in 2-3 weeks. This is followed by a less itchy and dry phase. Pain and swelling. Cracks or long, narrow openings (fissures) in the skin. Severe dryness. Ridges on the nails. How is this diagnosed? This condition may be diagnosed based on: Symptoms. Physical exam. Medical history. Skin scrapings to rule out fungal infections. Testing a swab of fluid for bacteria. A biopsy. A small piece of skin is removed and checked for infection or to rule out other conditions. Skin patch tests that use allergen patches on your back to check for allergic reactions. You may need to see a skin specialist called a dermatologist. This specialist can help diagnose and treat this condition. How is this treated? There's no cure for this condition, but treatment can help relieve symptoms. Your health care provider may suggest: Avoiding allergens, irritants, or triggers that  make your symptoms worse. You may need to: Use different soaps or lotions. Avoid hot weather or places where you'll sweat a lot. Learn stress management techniques, like relaxation and exercise. Follow diet changes that your provider recommends. Soothing your skin by: Using a clean, damp towel on the affected area. Taking baths with a special salt called aluminum acetate. Medicines such as: Medicine to lessen itching (antihistamines). Medicine to put on your skin to lessen swelling and irritation (corticosteroid creams or ointments). Immunosuppressant medicines. These are prescribed in severe cases. Antibiotic medicine if there's a skin infection. Light therapy, also called phototherapy. This is where you put your affected skin under ultraviolet (UV) light to lessen itchiness and inflammation. Follow these instructions at home: Bathing and skin care  Wash your skin gently. After bathing or washing your hands, pat your skin dry. Avoid rubbing. Take off your jewelry before bathing. If your skin under the jewelry stays wet, blisters may form or get worse. Apply cool compresses as told by your provider. To do this: Soak a clean towel in cool water. Squeeze out the water until the towel is damp. Place the towel on the affected skin for 20 minutes, 2-3 times a day. Let the water partially air dry, and then put on lotion. Use mild soaps, cleaners, and lotions that don't contain dyes, perfumes, or irritants. Keep your skin hydrated. To do this: Take warm baths or showers. Avoid hot water. Put on lotion within 3 minutes of bathing to lock in moisture. Medicines Take or apply your medicines only as told. If you were given antibiotics, take or apply them as told.  Do not stop using them even if you start to feel better. General instructions Use skin creams or lotions as told. Avoid triggers and allergens that make your symptoms worse. Keep fingernails short to avoid scratching open the  skin. Use waterproof gloves to protect your hands when doing work that keeps your hands wet for a long time. Limit how often you wear socks or shoes. If you have to wear socks, wear cotton socks that absorb moisture. Choose loose-fitting shoes. Do not smoke, vape, or use nicotine or tobacco. Keep all follow-up visits to make sure your treatment plan is working. Contact a health care provider if: You have symptoms that don't go away. You have signs of infection, such as: Crusting, pus, or a bad smell. More redness, swelling, or pain. More warmth in the affected area. Your skin has red streaks that are painful. This information is not intended to replace advice given to you by your health care provider. Make sure you discuss any questions you have with your health care provider. Document Revised: 01/10/2023 Document Reviewed: 01/10/2023 Elsevier Patient Education  2024 [employer].

## 2024-01-08 NOTE — Assessment & Plan Note (Signed)
Stable off meds Support offered 

## 2024-01-08 NOTE — Progress Notes (Signed)
 Subjective:    Patient ID: Molly Banks, female    DOB: September 23, 1960, 63 y.o.   MRN: [id number]  HPI  Patient presents to clinic today for follow-up of chronic conditions.  COPD: She reports chronic cough and intermittent shortness of breath.  She uses breztri  and albuterol  only as needed.  There are no PFTs on file.  She does continue to smoke.  Frequent headaches: These occur rarely.  Triggered by lack of caffeine.  She takes ibuprofen  as needed with good relief of symptoms.  She does not follow with neurology.  GERD: She is not sure what triggers this but has not been an issue lately.  She is not currently taking any medications for this but has been on famotidine  in the past.  There is no upper GI on file.  HLD with aortic atherosclerosis: Her last LDL was 87, triglycerides 784, 06/2023.  She is not taking any cholesterol-lowering medication at this time but has been prescribed simvastatin  in the past.  She is taking aspirin .  She tries to consume a low-fat diet.  OAB: She reports mainly urgency and frequency.  She is not taking any medications for this.  She does not follow with urology.  Insomnia: She has difficulty falling asleep.  She is not taking any medication for this.  There is no sleep study on file.  RLS: She is not currently taking any medications for this.  She does not follow with neurology.  Polycythemia: Her last H/H was 14.9/45.1, 06/2023.  She does smoke.  She does not follow with hematology.  Osteopenia: She is not taking any calcium and vitamin d OTC.  She is getting some weightbearing exercise.  Bone density from 08/2022 reviewed.  Anxiety and depression: She is not currently taking any medication for this. She is not seeing a therapist. She denies depression, SI/HI.  Review of Systems  Past Medical History:  Diagnosis Date   Arthritis    Breast lump in female 2005   COPD (chronic obstructive pulmonary disease) (HCC)    Emphysema lung (HCC)    Erythrocytosis     Frequent headaches    tension related   GERD (gastroesophageal reflux disease)    h/o   Hypoglycemia    Hypoglycemia    Leukocytosis    Thoracic scoliosis    Left concavity   Thyroid  nodule    right side    Current Outpatient Medications  Medication Sig Dispense Refill   albuterol  (VENTOLIN  HFA) 108 (90 Base) MCG/ACT inhaler Inhale 2 puffs into the lungs every 6 (six) hours as needed for wheezing or shortness of breath. 8 g 0   aspirin  EC 81 MG tablet Take 1 tablet (81 mg total) by mouth daily. Swallow whole. 30 tablet 12   Budeson-Glycopyrrol-Formoterol (BREZTRI  AEROSPHERE) 160-9-4.8 MCG/ACT AERO Inhale 2 puffs into the lungs 2 (two) times daily. 10.7 g 11   busPIRone  (BUSPAR ) 10 MG tablet Take 1 tablet (10 mg total) by mouth 2 (two) times daily. (Patient not taking: Reported on 10/03/2023) 180 tablet 1   ibuprofen  (ADVIL ) 800 MG tablet Take 800 mg by mouth every 8 (eight) hours as needed.     nicotine  (NICODERM CQ ) 21 mg/24hr patch Place 1 patch (21 mg total) onto the skin daily. 60 patch 0   simvastatin  (ZOCOR ) 10 MG tablet Take 1 tablet (10 mg total) by mouth at bedtime. (Patient not taking: Reported on 10/03/2023) 90 tablet 1   No current facility-administered medications for this visit.  Allergies  Allergen Reactions   Guaifenesin Shortness Of Breath and Itching   Tusso-Df [Hydrocodone -Guaifenesin] Shortness Of Breath   Mobic  [Meloxicam ] Other (See Comments)    headache    Family History  Problem Relation Age of Onset   Brain cancer Mother    Cancer Mother        brain   Hiatal hernia Mother    Heart attack Father 75   Lung cancer Father    COPD Father    Cancer - Lung Father    Lymphoma Sister    Breast cancer Sister 62       BRCA positive test   Breast cancer Maternal Grandmother    Lung cancer Maternal Grandfather    Breast cancer Daughter 84   Ovarian cancer Neg Hx    Colon cancer Neg Hx    Stroke Neg Hx    Heart disease Neg Hx     Social History    Socioeconomic History   Marital status: Married    Spouse name: Not on file   Number of children: Not on file   Years of education: Not on file   Highest education level: Not on file  Occupational History   Not on file  Tobacco Use   Smoking status: Every Day    Current packs/day: 1.00    Average packs/day: 1 pack/day for 30.0 years (30.0 ttl pk-yrs)    Types: Cigarettes   Smokeless tobacco: Former   Tobacco comments:    14, 20s  Vaping Use   Vaping status: Former   Substances: Nicotine , Flavoring  Substance and Sexual Activity   Alcohol use: No   Drug use: No   Sexual activity: Not on file  Other Topics Concern   Not on file  Social History Narrative   Not on file   Social Drivers of Health   Financial Resource Strain: Low Risk  (07/07/2023)   Overall Financial Resource Strain (CARDIA)    Difficulty of Paying Living Expenses: Not hard at all  Food Insecurity: Food Insecurity Present (07/07/2023)   Hunger Vital Sign    Worried About Running Out of Food in the Last Year: Sometimes true    Ran Out of Food in the Last Year: Never true  Transportation Needs: No Transportation Needs (07/07/2023)   PRAPARE - Administrator, Civil Service (Medical): No    Lack of Transportation (Non-Medical): No  Physical Activity: Sufficiently Active (07/07/2023)   Exercise Vital Sign    Days of Exercise per Week: 7 days    Minutes of Exercise per Session: 150+ min  Stress: No Stress Concern Present (07/07/2023)   [employer] of Occupational Health - Occupational Stress Questionnaire    Feeling of Stress : Not at all  Social Connections: Moderately Integrated (07/07/2023)   Social Connection and Isolation Panel [NHANES]    Frequency of Communication with Friends and Family: More than three times a week    Frequency of Social Gatherings with Friends and Family: More than three times a week    Attends Religious Services: 1 to 4 times per year    Active Member of [employer]  or Organizations: No    Attends Banker Meetings: Never    Marital Status: Married  Catering manager Violence: Not At Risk (07/07/2023)   Humiliation, Afraid, Rape, and Kick questionnaire    Fear of Current or Ex-Partner: No    Emotionally Abused: No    Physically Abused: No    Sexually Abused: No  Constitutional: Patient reports intermittent headaches.  Denies fever, malaise, fatigue, or abrupt weight changes.  HEENT: Denies eye pain, eye redness, ear pain, ringing in the ears, wax buildup, runny nose, nasal congestion, bloody nose, or sore throat. Respiratory: Patient reports cough and shortness of breath.  Denies difficulty breathing, or sputum production.   Cardiovascular: Denies chest pain, chest tightness, palpitations or swelling in the hands or feet.  Gastrointestinal: Denies abdominal pain, bloating, constipation, diarrhea or blood in the stool.  GU: Patient reports urinary urgency and frequency.  Denies pain with urination, burning sensation, blood in urine, odor or discharge. Musculoskeletal: Denies decrease in range of motion, difficulty with gait, muscle pain or joint pain and swelling.  Skin: Pt reports rash of hands. Denies redness, lesions or ulcercations.  Neurological: Patient reports insomnia, restless legs.  Denies dizziness, difficulty with memory, difficulty with speech or problems with balance and coordination.  Psych: Patient reports anxiety and depression.  Denies SI/HI.  No other specific complaints in a complete review of systems (except as listed in HPI above).     Objective:   Physical Exam BP 110/72 (BP Location: Left Arm, Patient Position: Sitting, Cuff Size: Normal)   Ht 5\' 2"  (1.575 m)   Wt 121 lb 6.4 oz (55.1 kg)   BMI 22.20 kg/m    Wt Readings from Last 3 Encounters:  10/03/23 126 lb 12.8 oz (57.5 kg)  08/28/23 127 lb (57.6 kg)  07/07/23 126 lb (57.2 kg)    General: Appears her stated age, chronically ill-appearing, in  NAD. Skin: Warm, dry and intact. Dyshidrotic eczema noted of bilateral hands. HEENT: Head: normal shape and size; Eyes: sclera white, no icterus, conjunctiva pink, PERRLA and EOMs intact;  Cardiovascular: Normal rate and rhythm. S1,S2 noted.  No murmur, rubs or gallops noted. No JVD or BLE edema. No carotid bruits noted. Pulmonary/Chest: Normal effort and coarse breath sounds. No respiratory distress. No wheezes, rales or ronchi noted.  Abdomen: Normal bowel sounds.  Musculoskeletal: No difficulty with gait.  Neurological: Alert and oriented. Coordination normal.  Psychiatric: Mood and affect mildly normal. Behavior is normal. Judgment and thought content normal.     BMET    Component Value Date/Time   NA 140 07/07/2023 1441   K 4.0 07/07/2023 1441   CL 104 07/07/2023 1441   CO2 27 07/07/2023 1441   GLUCOSE 75 07/07/2023 1441   BUN 11 07/07/2023 1441   CREATININE 0.65 07/07/2023 1441   CALCIUM 9.2 07/07/2023 1441   GFRNONAA >60 03/17/2023 0959   GFRNONAA 98 12/12/2019 0840   GFRAA >60 01/03/2020 1210   GFRAA 113 12/12/2019 0840    Lipid Panel     Component Value Date/Time   CHOL 195 07/07/2023 1441   TRIG 159 (H) 07/07/2023 1441   HDL 82 07/07/2023 1441   CHOLHDL 2.4 07/07/2023 1441   VLDL 12 04/03/2017 0800   LDLCALC 87 07/07/2023 1441    CBC    Component Value Date/Time   WBC 8.6 07/07/2023 1441   RBC 5.23 (H) 07/07/2023 1441   HGB 14.9 07/07/2023 1441   HGB 14.2 08/18/2021 1543   HCT 45.1 (H) 07/07/2023 1441   HCT 40.9 08/18/2021 1543   PLT 285 07/07/2023 1441   MCV 86.2 07/07/2023 1441   MCH 28.5 07/07/2023 1441   MCHC 33.0 07/07/2023 1441   RDW 12.6 07/07/2023 1441   LYMPHSABS 1.2 08/16/2022 1544   MONOABS 1.6 (H) 08/16/2022 1544   EOSABS 0.0 08/16/2022 1544   BASOSABS 0.0 08/16/2022  1544    Hgb A1C Lab Results  Component Value Date   HGBA1C 5.4 07/07/2023           Assessment & Plan:      RTC in 6 months for your annual exam Helayne Lo, NP

## 2024-01-08 NOTE — Assessment & Plan Note (Signed)
Encourage smoking cessation CBC today 

## 2024-01-08 NOTE — Assessment & Plan Note (Signed)
 C-Met and lipid profile today Encouraged her to consume low-fat diet Discussed if her LDL is >70 will have her restart simvastatin  Continue aspirin 

## 2024-01-08 NOTE — Assessment & Plan Note (Signed)
 C-Met and lipid profile today Encouraged her to consume a low-fat diet Advised her if LDL >70, will have her restart simvastatin 

## 2024-01-08 NOTE — Assessment & Plan Note (Signed)
 Encourage smoking cessation Advised her to use the breztri  twice daily as prescribed Continue albuterol  as needed

## 2024-01-08 NOTE — Assessment & Plan Note (Signed)
 Not medicated

## 2024-01-08 NOTE — Assessment & Plan Note (Signed)
Encouraged her to start calcium and vitamin D OTC Encouraged daily weightbearing exercise

## 2024-01-08 NOTE — Assessment & Plan Note (Signed)
 Avoid excessively hot water and overuse of hand sanitizer Recommend moisturizing lotion such as Aveeno, Cetaphil or Eucerin after washing hands

## 2024-01-08 NOTE — Assessment & Plan Note (Signed)
 Not medicated We will monitor

## 2024-01-08 NOTE — Assessment & Plan Note (Signed)
Currently not an issue Not medicated

## 2024-01-09 ENCOUNTER — Ambulatory Visit: Payer: Self-pay | Admitting: Internal Medicine

## 2024-01-19 ENCOUNTER — Other Ambulatory Visit: Payer: Self-pay | Admitting: Internal Medicine

## 2024-01-20 NOTE — Telephone Encounter (Signed)
 Requested Prescriptions  Pending Prescriptions Disp Refills   budesonide-glycopyrrolate -formoterol (BREZTRI  AEROSPHERE) 160-9-4.8 MCG/ACT AERO inhaler [Pharmacy Med Name: BREZTRI  160-9-4.8 MCG/ACT  AER] 10.7 g 6    Sig: INHALE 2 PUFFS TWICE DAILY     Off-Protocol Failed - 01/20/2024  3:45 PM      Failed - Medication not assigned to a protocol, review manually.      Passed - Valid encounter within last 12 months    Recent Outpatient Visits           1 week ago Pure hypercholesterolemia   [place] [facility] [place], Rankin Buzzard, NP   3 months ago Simple chronic bronchitis [facility])   [place] [place], Rankin Buzzard, [place]

## 2024-02-20 ENCOUNTER — Other Ambulatory Visit: Payer: Self-pay | Admitting: Acute Care

## 2024-02-20 DIAGNOSIS — F17210 Nicotine dependence, cigarettes, uncomplicated: Secondary | ICD-10-CM

## 2024-02-20 DIAGNOSIS — Z87891 Personal history of nicotine dependence: Secondary | ICD-10-CM

## 2024-02-20 DIAGNOSIS — Z122 Encounter for screening for malignant neoplasm of respiratory organs: Secondary | ICD-10-CM

## 2024-03-11 ENCOUNTER — Ambulatory Visit
Admission: RE | Admit: 2024-03-11 | Discharge: 2024-03-11 | Disposition: A | Discharge status: Home / Self Care | Source: Ambulatory Visit | Attending: Acute Care | Admitting: Acute Care

## 2024-03-11 DIAGNOSIS — Z122 Encounter for screening for malignant neoplasm of respiratory organs: Secondary | ICD-10-CM | POA: Insufficient documentation

## 2024-03-11 DIAGNOSIS — Z87891 Personal history of nicotine dependence: Secondary | ICD-10-CM | POA: Insufficient documentation

## 2024-03-11 DIAGNOSIS — F17210 Nicotine dependence, cigarettes, uncomplicated: Secondary | ICD-10-CM | POA: Insufficient documentation

## 2024-03-18 ENCOUNTER — Other Ambulatory Visit: Payer: Self-pay

## 2024-03-18 DIAGNOSIS — F17210 Nicotine dependence, cigarettes, uncomplicated: Secondary | ICD-10-CM

## 2024-03-18 DIAGNOSIS — Z122 Encounter for screening for malignant neoplasm of respiratory organs: Secondary | ICD-10-CM

## 2024-03-18 DIAGNOSIS — Z87891 Personal history of nicotine dependence: Secondary | ICD-10-CM

## 2024-06-24 ENCOUNTER — Telehealth: Payer: Self-pay

## 2024-06-24 NOTE — Telephone Encounter (Signed)
 Spoke with patient, informed her she will not need to be fasting for her labs

## 2024-06-24 NOTE — Telephone Encounter (Signed)
 Copied from CRM #[id number]. Topic: Clinical - Request for Lab/Test Order >> Jun 24, 2024  2:07 PM Darshell M wrote: Reason for CRM: Patient wants to know if fasting labs are necessary for next visit on 11/17. Patient CB# [phone]

## 2024-07-08 ENCOUNTER — Encounter: Admitting: Internal Medicine

## 2024-07-22 ENCOUNTER — Ambulatory Visit (INDEPENDENT_AMBULATORY_CARE_PROVIDER_SITE_OTHER): Admitting: Internal Medicine

## 2024-07-22 ENCOUNTER — Encounter: Payer: Self-pay | Admitting: Internal Medicine

## 2024-07-22 VITALS — BP 124/82 | Ht 62.0 in | Wt 120.2 lb

## 2024-07-22 DIAGNOSIS — E7800 Pure hypercholesterolemia, unspecified: Secondary | ICD-10-CM

## 2024-07-22 DIAGNOSIS — Z1211 Encounter for screening for malignant neoplasm of colon: Secondary | ICD-10-CM | POA: Diagnosis not present

## 2024-07-22 DIAGNOSIS — R739 Hyperglycemia, unspecified: Secondary | ICD-10-CM

## 2024-07-22 DIAGNOSIS — Z0001 Encounter for general adult medical examination with abnormal findings: Secondary | ICD-10-CM | POA: Diagnosis not present

## 2024-07-22 LAB — COMPREHENSIVE METABOLIC PANEL WITH GFR
AG Ratio: 1.9 (calc) (ref 1.0–2.5)
ALT: 6 U/L (ref 6–29)
AST: 14 U/L (ref 10–35)
Albumin: 4.5 g/dL (ref 3.6–5.1)
Alkaline phosphatase (APISO): 90 U/L (ref 37–153)
BUN: 9 mg/dL (ref 7–25)
CO2: 30 mmol/L (ref 20–32)
Calcium: 9.3 mg/dL (ref 8.6–10.4)
Chloride: 103 mmol/L (ref 98–110)
Creat: 0.63 mg/dL (ref 0.50–1.05)
Globulin: 2.4 g/dL (ref 1.9–3.7)
Glucose, Bld: 68 mg/dL (ref 65–139)
Potassium: 4.3 mmol/L (ref 3.5–5.3)
Sodium: 141 mmol/L (ref 135–146)
Total Bilirubin: 0.5 mg/dL (ref 0.2–1.2)
Total Protein: 6.9 g/dL (ref 6.1–8.1)
eGFR: 100 mL/min/1.73m2 (ref >=60)

## 2024-07-22 LAB — CBC
HCT: 49.1 % — ABNORMAL HIGH (ref 35.9–46.0)
Hemoglobin: 16.2 g/dL — ABNORMAL HIGH (ref 11.7–15.5)
MCH: 29.2 pg (ref 27.0–33.0)
MCHC: 33 g/dL (ref 31.6–35.4)
MCV: 88.5 fL (ref 81.4–101.7)
MPV: 9.4 fL (ref 7.5–12.5)
Platelets: 320 Thousand/uL (ref 140–400)
RBC: 5.55 Million/uL — ABNORMAL HIGH (ref 3.80–5.10)
RDW: 12.8 % (ref 11.0–15.0)
WBC: 7.9 Thousand/uL (ref 3.8–10.8)

## 2024-07-22 LAB — LIPID PANEL
Cholesterol: 249 mg/dL — ABNORMAL HIGH (ref <200)
HDL: 92 mg/dL (ref >=50)
LDL Cholesterol (Calc): 141 mg/dL — ABNORMAL HIGH
Non-HDL Cholesterol (Calc): 157 mg/dL — ABNORMAL HIGH (ref <130)
Total CHOL/HDL Ratio: 2.7 (calc) (ref <5.0)
Triglycerides: 68 mg/dL (ref <150)

## 2024-07-22 LAB — HEMOGLOBIN A1C
Hgb A1c MFr Bld: 5.3 % (ref <5.7)
Mean Plasma Glucose: 105 mg/dL
eAG (mmol/L): 5.8 mmol/L

## 2024-07-22 MED ORDER — BUSPIRONE HCL 10 MG PO TABS: 10.0000 mg | ORAL_TABLET | Freq: Two times a day (BID) | ORAL | 1 refills | Status: AC

## 2024-07-22 MED ORDER — SIMVASTATIN 10 MG PO TABS: 10.0000 mg | ORAL_TABLET | Freq: Every day | ORAL | 1 refills | Status: AC

## 2024-07-22 MED ORDER — BREZTRI AEROSPHERE 160-9-4.8 MCG/ACT IN AERO: 2.0000 | INHALATION_SPRAY | Freq: Two times a day (BID) | RESPIRATORY_TRACT | 6 refills | Status: AC

## 2024-07-22 NOTE — Progress Notes (Signed)
 Subjective:    Patient ID: Molly Banks, female    DOB: 02/04/1961, 63 y.o.   MRN: [id number]  HPI  Patient presents to clinic today for her annual exam.  Flu: never Tetanus: < 10 years COVID: Janssen x1 Prevnar 20: Never Shingrix: never Pap smear: Hysterectomy Mammogram: 12/2021 Bone density: 08/2022 Lung cancer screening: 02/2024 Colon screening: 12/2013 Vision screening: annually Dentist: as needed, dentures  Diet: She does eat meat. She consumes fruits and veggies. She does eat fried foods. She drinks mostly soda. Exercise: Walking  Review of Systems  Past Medical History:  Diagnosis Date   Arthritis    Breast lump in female 2005   COPD (chronic obstructive pulmonary disease) (HCC)    Emphysema lung (HCC)    Erythrocytosis    Frequent headaches    tension related   GERD (gastroesophageal reflux disease)    h/o   Hypoglycemia    Hypoglycemia    Leukocytosis    Thoracic scoliosis    Left concavity   Thyroid  nodule    right side    Current Outpatient Medications  Medication Sig Dispense Refill   albuterol  (VENTOLIN  HFA) 108 (90 Base) MCG/ACT inhaler Inhale 2 puffs into the lungs every 6 (six) hours as needed for wheezing or shortness of breath. 8 g 0   aspirin  EC 81 MG tablet Take 1 tablet (81 mg total) by mouth daily. Swallow whole. 30 tablet 12   budesonide-glycopyrrolate -formoterol (BREZTRI  AEROSPHERE) 160-9-4.8 MCG/ACT AERO inhaler INHALE 2 PUFFS TWICE DAILY 10.7 g 6   busPIRone  (BUSPAR ) 10 MG tablet Take 1 tablet (10 mg total) by mouth 2 (two) times daily. 180 tablet 1   ibuprofen  (ADVIL ) 800 MG tablet Take 800 mg by mouth every 8 (eight) hours as needed.     nicotine  (NICODERM CQ ) 21 mg/24hr patch Place 1 patch (21 mg total) onto the skin daily. 60 patch 0   simvastatin  (ZOCOR ) 10 MG tablet Take 1 tablet (10 mg total) by mouth at bedtime. (Patient not taking: Reported on 01/08/2024) 90 tablet 1   No current facility-administered medications for this visit.     Allergies  Allergen Reactions   Guaifenesin Shortness Of Breath and Itching   Tusso-Df [Hydrocodone -Guaifenesin] Shortness Of Breath   Mobic  [Meloxicam ] Other (See Comments)    headache    Family History  Problem Relation Age of Onset   Brain cancer Mother    Cancer Mother        brain   Hiatal hernia Mother    Heart attack Father 4   Lung cancer Father    COPD Father    Cancer - Lung Father    Lymphoma Sister    Breast cancer Sister 29       BRCA positive test   Breast cancer Maternal Grandmother    Lung cancer Maternal Grandfather    Breast cancer Daughter 64   Ovarian cancer Neg Hx    Colon cancer Neg Hx    Stroke Neg Hx    Heart disease Neg Hx     Social History   Socioeconomic History   Marital status: Married    Spouse name: Not on file   Number of children: Not on file   Years of education: Not on file   Highest education level: Not on file  Occupational History   Not on file  Tobacco Use   Smoking status: Every Day    Current packs/day: 0.25    Average packs/day: 0.3 packs/day for 30.0 years (  7.5 ttl pk-yrs)    Types: Cigarettes   Smokeless tobacco: Former   Tobacco comments:    14, 20s  Vaping Use   Vaping status: Former   Substances: Nicotine , Flavoring  Substance and Sexual Activity   Alcohol use: No   Drug use: No   Sexual activity: Not on file  Other Topics Concern   Not on file  Social History Narrative   Not on file   Social Drivers of Health   Financial Resource Strain: Low Risk  (07/07/2023)   Overall Financial Resource Strain (CARDIA)    Difficulty of Paying Living Expenses: Not hard at all  Food Insecurity: Food Insecurity Present (07/07/2023)   Hunger Vital Sign    Worried About Running Out of Food in the Last Year: Sometimes true    Ran Out of Food in the Last Year: Never true  Transportation Needs: No Transportation Needs (07/07/2023)   PRAPARE - Administrator, Civil Service (Medical): No    Lack of  Transportation (Non-Medical): No  Physical Activity: Sufficiently Active (07/07/2023)   Exercise Vital Sign    Days of Exercise per Week: 7 days    Minutes of Exercise per Session: 150+ min  Stress: No Stress Concern Present (07/07/2023)   [employer] of Occupational Health - Occupational Stress Questionnaire    Feeling of Stress : Not at all  Social Connections: Moderately Integrated (07/07/2023)   Social Connection and Isolation Panel    Frequency of Communication with Friends and Family: More than three times a week    Frequency of Social Gatherings with Friends and Family: More than three times a week    Attends Religious Services: 1 to 4 times per year    Active Member of [employer] or Organizations: No    Attends Banker Meetings: Never    Marital Status: Married  Catering Manager Violence: Not At Risk (07/07/2023)   Humiliation, Afraid, Rape, and Kick questionnaire    Fear of Current or Ex-Partner: No    Emotionally Abused: No    Physically Abused: No    Sexually Abused: No     Constitutional: Patient reports intermittent headaches.  Denies fever, malaise, fatigue, or abrupt weight changes.  HEENT: Denies eye pain, eye redness, ear pain, ringing in the ears, wax buildup, runny nose, nasal congestion, bloody nose, or sore throat. Respiratory: Pt reports chronic cough. Denies difficulty breathing, shortness of breath, or sputum production.   Cardiovascular: Denies chest pain, chest tightness, palpitations or swelling in the hands or feet.  Gastrointestinal:  Denies abdominal pain, bloating, constipation, diarrhea or blood in the stool.  GU: Patient reports urinary frequency and urgency.  Denies pain with urination, burning sensation, blood in urine, odor or discharge. Musculoskeletal: Denies decrease in range of motion, difficulty with gait, muscle pain or joint pain and swelling.  Skin: Denies redness, rashes, lesions or ulcercations.  Neurological: Patient  reports insomnia and restless legs.  Denies dizziness, difficulty with memory, difficulty with speech or problems with balance and coordination.  Psych: Patient has a history of anxiety and depression.  Denies SI/HI.  No other specific complaints in a complete review of systems (except as listed in HPI above).     Objective:   Physical Exam BP 124/82 (BP Location: Left Arm, Patient Position: Sitting, Cuff Size: Normal)   Ht 5' 2 (1.575 m)   Wt 120 lb 3.2 oz (54.5 kg)   BMI 21.98 kg/m     Wt Readings from Last 3  Encounters:  03/11/24 115 lb (52.2 kg)  01/08/24 121 lb 6.4 oz (55.1 kg)  10/03/23 126 lb 12.8 oz (57.5 kg)    General: Appears her stated age, well developed, well nourished in NAD. Skin: Warm, dry and intact. Senile purpura noted. HEENT: Head: normal shape and size; Eyes: sclera white, no icterus, conjunctiva pink, PERRLA and EOMs intact;  Neck:  Neck supple, trachea midline. No masses, lumps or thyromegaly present.  Cardiovascular: Normal rate and rhythm. S1,S2 noted.  No murmur, rubs or gallops noted. No JVD or BLE edema. No carotid bruits noted. Pulmonary/Chest: Normal effort and positive vesicular breath sounds. No respiratory distress. No wheezes, rales or ronchi noted.  Abdomen: Soft and nontender. Normal bowel sounds.  Musculoskeletal: Strength 5/5 BUE/BLE.  No difficulty with gait.  Neurological: Alert and oriented. Cranial nerves II-XII grossly intact. Coordination normal.  Psychiatric: Mood and affect normal. Behavior is normal. Judgment and thought content normal.    BMET    Component Value Date/Time   NA 142 01/08/2024 1446   K 4.1 01/08/2024 1446   CL 106 01/08/2024 1446   CO2 26 01/08/2024 1446   GLUCOSE 70 01/08/2024 1446   BUN 7 01/08/2024 1446   CREATININE 0.65 01/08/2024 1446   CALCIUM 9.2 01/08/2024 1446   GFRNONAA >60 03/17/2023 0959   GFRNONAA 98 12/12/2019 0840   GFRAA >60 01/03/2020 1210   GFRAA 113 12/12/2019 0840    Lipid Panel      Component Value Date/Time   CHOL 185 01/08/2024 1446   TRIG 147 01/08/2024 1446   HDL 65 01/08/2024 1446   CHOLHDL 2.8 01/08/2024 1446   VLDL 12 04/03/2017 0800   LDLCALC 96 01/08/2024 1446    CBC    Component Value Date/Time   WBC 8.5 01/08/2024 1446   RBC 5.19 (H) 01/08/2024 1446   HGB 14.7 01/08/2024 1446   HGB 14.2 08/18/2021 1543   HCT 45.6 (H) 01/08/2024 1446   HCT 40.9 08/18/2021 1543   PLT 310 01/08/2024 1446   MCV 87.9 01/08/2024 1446   MCH 28.3 01/08/2024 1446   MCHC 32.2 01/08/2024 1446   RDW 12.5 01/08/2024 1446   LYMPHSABS 1.2 08/16/2022 1544   MONOABS 1.6 (H) 08/16/2022 1544   EOSABS 0.0 08/16/2022 1544   BASOSABS 0.0 08/16/2022 1544    Hgb A1C Lab Results  Component Value Date   HGBA1C 5.4 01/08/2024            Assessment & Plan:   Preventative Health Maintenance:  She declines flu shot today Tetanus UTD per her report Encouraged her to get her COVID booster Prevnar 20 declined Discussed Shingrix vaccine, she will check coverage with her insurance company and schedule a nurse visit if she would like to have this done She no longer needs Pap smears Mammogram declined at this time Bone density UTD Referral to GI for screening colonoscopy CT lung cancer screening UTD Encouraged her to consume a balanced diet and exercise regimen Advised her to see an eye doctor and dentist annually We will check CBC, c-Met, lipid profile and A1c today   RTC in 6 months, follow-up chronic conditions Angeline Laura, NP

## 2024-07-22 NOTE — Patient Instructions (Signed)
 Health Maintenance for Postmenopausal Women Menopause is a normal process in which your ability to get pregnant comes to an end. This process happens slowly over many months or years, usually between the ages of 76 and 38. Menopause is complete when you have missed your menstrual period for 12 months. It is important to talk with your health care provider about some of the most common conditions that affect women after menopause (postmenopausal women). These include heart disease, cancer, and bone loss (osteoporosis). Adopting a healthy lifestyle and getting preventive care can help to promote your health and wellness. The actions you take can also lower your chances of developing some of these common conditions. What are the signs and symptoms of menopause? During menopause, you may have the following symptoms: Hot flashes. These can be moderate or severe. Night sweats. Decrease in sex drive. Mood swings. Headaches. Tiredness (fatigue). Irritability. Memory problems. Problems falling asleep or staying asleep. Talk with your health care provider about treatment options for your symptoms. Do I need hormone replacement therapy? Hormone replacement therapy is effective in treating symptoms that are caused by menopause, such as hot flashes and night sweats. Hormone replacement carries certain risks, especially as you become older. If you are thinking about using estrogen or estrogen with progestin, discuss the benefits and risks with your health care provider. How can I reduce my risk for heart disease and stroke? The risk of heart disease, heart attack, and stroke increases as you age. One of the causes may be a change in the body's hormones during menopause. This can affect how your body uses dietary fats, triglycerides, and cholesterol. Heart attack and stroke are medical emergencies. There are many things that you can do to help prevent heart disease and stroke. Watch your blood pressure High  blood pressure causes heart disease and increases the risk of stroke. This is more likely to develop in people who have high blood pressure readings or are overweight. Have your blood pressure checked: Every 3-5 years if you are 32-23 years of age. Every year if you are 31 years old or older. Eat a healthy diet  Eat a diet that includes plenty of vegetables, fruits, low-fat dairy products, and lean protein. Do not eat a lot of foods that are high in solid fats, added sugars, or sodium. Get regular exercise Get regular exercise. This is one of the most important things you can do for your health. Most adults should: Try to exercise for at least 150 minutes each week. The exercise should increase your heart rate and make you sweat (moderate-intensity exercise). Try to do strengthening exercises at least twice each week. Do these in addition to the moderate-intensity exercise. Spend less time sitting. Even light physical activity can be beneficial. Other tips Work with your health care provider to achieve or maintain a healthy weight. Do not use any products that contain nicotine or tobacco. These products include cigarettes, chewing tobacco, and vaping devices, such as e-cigarettes. If you need help quitting, ask your health care provider. Know your numbers. Ask your health care provider to check your cholesterol and your blood sugar (glucose). Continue to have your blood tested as directed by your health care provider. Do I need screening for cancer? Depending on your health history and family history, you may need to have cancer screenings at different stages of your life. This may include screening for: Breast cancer. Cervical cancer. Lung cancer. Colorectal cancer. What is my risk for osteoporosis? After menopause, you may be  at increased risk for osteoporosis. Osteoporosis is a condition in which bone destruction happens more quickly than new bone creation. To help prevent osteoporosis or  the bone fractures that can happen because of osteoporosis, you may take the following actions: If you are 24-54 years old, get at least 1,000 mg of calcium and at least 600 international units (IU) of vitamin D  per day. If you are older than age 75 but younger than age 30, get at least 1,200 mg of calcium and at least 600 international units (IU) of vitamin D  per day. If you are older than age 8, get at least 1,200 mg of calcium and at least 800 international units (IU) of vitamin D  per day. Smoking and drinking excessive alcohol increase the risk of osteoporosis. Eat foods that are rich in calcium and vitamin D , and do weight-bearing exercises several times each week as directed by your health care provider. How does menopause affect my mental health? Depression may occur at any age, but it is more common as you become older. Common symptoms of depression include: Feeling depressed. Changes in sleep patterns. Changes in appetite or eating patterns. Feeling an overall lack of motivation or enjoyment of activities that you previously enjoyed. Frequent crying spells. Talk with your health care provider if you think that you are experiencing any of these symptoms. General instructions See your health care provider for regular wellness exams and vaccines. This may include: Scheduling regular health, dental, and eye exams. Getting and maintaining your vaccines. These include: Influenza vaccine. Get this vaccine each year before the flu season begins. Pneumonia vaccine. Shingles vaccine. Tetanus, diphtheria, and pertussis (Tdap) booster vaccine. Your health care provider may also recommend other immunizations. Tell your health care provider if you have ever been abused or do not feel safe at home. Summary Menopause is a normal process in which your ability to get pregnant comes to an end. This condition causes hot flashes, night sweats, decreased interest in sex, mood swings, headaches, or lack  of sleep. Treatment for this condition may include hormone replacement therapy. Take actions to keep yourself healthy, including exercising regularly, eating a healthy diet, watching your weight, and checking your blood pressure and blood sugar levels. Get screened for cancer and depression. Make sure that you are up to date with all your vaccines. This information is not intended to replace advice given to you by your health care provider. Make sure you discuss any questions you have with your health care provider. Document Revised: 12/28/2020 Document Reviewed: 12/28/2020 Elsevier Patient Education  2024 [employer].

## 2024-07-23 ENCOUNTER — Ambulatory Visit: Payer: Self-pay | Admitting: Internal Medicine

## 2024-08-01 ENCOUNTER — Other Ambulatory Visit: Payer: Self-pay

## 2024-08-01 ENCOUNTER — Telehealth: Payer: Self-pay

## 2024-08-01 DIAGNOSIS — Z1211 Encounter for screening for malignant neoplasm of colon: Secondary | ICD-10-CM

## 2024-08-01 MED ORDER — NA SULFATE-K SULFATE-MG SULF 17.5-3.13-1.6 GM/177ML PO SOLN: 1.0000 | Freq: Once | ORAL | 0 refills | Status: AC

## 2024-08-01 NOTE — Telephone Encounter (Signed)
 Gastroenterology Pre-Procedure Review  Request Date: 11/11/24 Requesting Physician: Dr. Jinny  PATIENT REVIEW QUESTIONS: The patient responded to the following health history questions as indicated:    1. Are you having any GI issues? no 2. Do you have a personal history of Polyps? no 3. Do you have a family history of Colon Cancer or Polyps? no 4. Diabetes Mellitus? no 5. Joint replacements in the past 12 months?no 6. Major health problems in the past 3 months?no 7. Any artificial heart valves, MVP, or defibrillator?no    MEDICATIONS & ALLERGIES:    Patient reports the following regarding taking any anticoagulation/antiplatelet therapy:   Plavix, Coumadin, Eliquis, Xarelto, Lovenox, Pradaxa, Brilinta, or Effient? no Aspirin ? no  Patient confirms/reports the following medications:  Current Outpatient Medications  Medication Sig Dispense Refill   albuterol  (VENTOLIN  HFA) 108 (90 Base) MCG/ACT inhaler Inhale 2 puffs into the lungs every 6 (six) hours as needed for wheezing or shortness of breath. 8 g 0   aspirin  EC 81 MG tablet Take 1 tablet (81 mg total) by mouth daily. Swallow whole. (Patient not taking: Reported on 07/22/2024) 30 tablet 12   budesonide-glycopyrrolate -formoterol (BREZTRI  AEROSPHERE) 160-9-4.8 MCG/ACT AERO inhaler Inhale 2 puffs into the lungs 2 (two) times daily. 10.7 g 6   busPIRone  (BUSPAR ) 10 MG tablet Take 1 tablet (10 mg total) by mouth 2 (two) times daily. 180 tablet 1   ibuprofen  (ADVIL ) 800 MG tablet Take 800 mg by mouth every 8 (eight) hours as needed.     simvastatin  (ZOCOR ) 10 MG tablet Take 1 tablet (10 mg total) by mouth at bedtime. 90 tablet 1   No current facility-administered medications for this visit.    Patient confirms/reports the following allergies:  Allergies[1]  No orders of the defined types were placed in this encounter.   AUTHORIZATION INFORMATION Primary Insurance: 1D#: Group #:  Secondary Insurance: 1D#: Group #:  SCHEDULE  INFORMATION: Date: 11/11/24 Time: Location: [facility]    [1]  Allergies Allergen Reactions   Guaifenesin Shortness Of Breath and Itching   Tusso-Df [Hydrocodone -Guaifenesin] Shortness Of Breath   Mobic  [Meloxicam ] Other (See Comments)    headache

## 2024-08-07 ENCOUNTER — Ambulatory Visit: Payer: Self-pay

## 2024-08-07 NOTE — Telephone Encounter (Signed)
 FYI Only or Action Required?: Action required by provider: request for appointment, medication refill request, and clinical question for provider.  Patient was last seen in primary care on 07/22/2024 by Antonette Angeline ORN, NP.  Called Nurse Triage reporting Ear Fullness.  Symptoms began several days ago.  Interventions attempted: Nothing.  Symptoms are: unchanged.  Triage Disposition: See PCP When Office is Open (Within 3 Days)  Patient/caregiver understands and will follow disposition?: No, wishes to speak with PCP   Copied from CRM #[id number]. Topic: Clinical - Red Word Triage >> Aug 07, 2024 10:45 AM Wess RAMAN wrote: Red Word that prompted transfer to Nurse Triage: Ears stopped up, congestion, cold and getting worse  Medications: Allergy and congestion and cough medication not working.  Pharmacy: [facility] [address] [place] - [address] [place] [address] Phone: [phone] Fax: [phone] Hours: Not open 24 hours  Would like an appt after 3p if possible Reason for Disposition  [1] Ear congestion lasts > 3 days AND [2] no improvement after using Care Advice  (Exception: Ear congestion is a chronic symptom.)  Answer Assessment - Initial Assessment Questions No available appts today with pcp, offered alt prov and appt with pcp tomorrow, pt declines. Patient reports only appts after 3 PM and could Angeline just send me a medication?  Advised call back or ED if symptoms worsen.  1. LOCATION: Which ear is involved?       Both ears 2. SENSATION: Describe how the ear feels. (e.g., stuffy, full, plugged).      Feel clogged; like going up in the mountains 3. ONSET:  When did the ear symptoms start?       2 days 4. PAIN: Do you also have an earache? If Yes, ask: How bad is it? (Scale 0-10; none, mild, moderate or severe)     0/10 5. CAUSE: What do you think is causing the ear congestion? (e.g., common cold, nasal allergies, recent  flight, recent snorkeling)     Cough, congestion runny nose; yellow 6. OTHER SYMPTOMS: Do you have any other symptoms? (e.g., ear drainage, hay fever symptoms such as sneezing or a clear nasal discharge; cold symptoms such as a cough or runny nose)     Denies fever, chills, n/v, HA, dizziness  Protocols used: Ear - Congestion-A-AH

## 2024-08-07 NOTE — Telephone Encounter (Signed)
 Appointment made for 08/08/24.

## 2024-08-07 NOTE — Progress Notes (Unsigned)
 Subjective:    Patient ID: Molly Banks, female    DOB: November 06, 1960, 63 y.o.   MRN: [id number]  HPI  Discussed the use of AI scribe software for clinical note transcription with the patient, who gave verbal consent to proceed.  Molly Banks is a 63 year old female with COPD who presents with ear fullness and upper respiratory symptoms.  She has been experiencing ear fullness and a sensation of pressure in her ears, similar to the feeling when ascending in altitude, for at least three weeks. Her ears feel 'stopped up', leading to frustration due to impaired hearing.  She also has a runny nose. No sore throat is present, but she mentions a mild headache that is not severe. A persistent cough, sometimes productive of clear phlegm, and occasional nausea are noted. No consistent fever, chills, or body aches are reported.  For symptom management, she takes an over-the-counter generic version of Claritin daily for allergies and an over-the-counter cough syrup every twelve hours, which helps manage her cough, especially at night. Despite waking up every three hours to eat, she reports sleeping well.  Her past medical history includes COPD.  She is taking Breztri  and albuterol  as prescribed.  She smokes but has reduced her smoking due to finding it unpleasant.       Review of Systems  Past Medical History:  Diagnosis Date   Arthritis    Breast lump in female 2005   COPD (chronic obstructive pulmonary disease) (HCC)    Emphysema lung (HCC)    Erythrocytosis    Frequent headaches    tension related   GERD (gastroesophageal reflux disease)    h/o   Hypoglycemia    Hypoglycemia    Leukocytosis    Thoracic scoliosis    Left concavity   Thyroid  nodule    right side    Current Outpatient Medications  Medication Sig Dispense Refill   albuterol  (VENTOLIN  HFA) 108 (90 Base) MCG/ACT inhaler Inhale 2 puffs into the lungs every 6 (six) hours as needed for wheezing or shortness of breath. 8 g 0    aspirin  EC 81 MG tablet Take 1 tablet (81 mg total) by mouth daily. Swallow whole. (Patient not taking: Reported on 07/22/2024) 30 tablet 12   budesonide-glycopyrrolate -formoterol (BREZTRI  AEROSPHERE) 160-9-4.8 MCG/ACT AERO inhaler Inhale 2 puffs into the lungs 2 (two) times daily. 10.7 g 6   busPIRone  (BUSPAR ) 10 MG tablet Take 1 tablet (10 mg total) by mouth 2 (two) times daily. 180 tablet 1   ibuprofen  (ADVIL ) 800 MG tablet Take 800 mg by mouth every 8 (eight) hours as needed.     simvastatin  (ZOCOR ) 10 MG tablet Take 1 tablet (10 mg total) by mouth at bedtime. 90 tablet 1   No current facility-administered medications for this visit.    Allergies  Allergen Reactions   Guaifenesin Shortness Of Breath and Itching   Tusso-Df [Hydrocodone -Guaifenesin] Shortness Of Breath   Mobic  [Meloxicam ] Other (See Comments)    headache    Family History  Problem Relation Age of Onset   Brain cancer Mother    Cancer Mother        brain   Hiatal hernia Mother    Heart attack Father 31   Lung cancer Father    COPD Father    Cancer - Lung Father    Lymphoma Sister    Breast cancer Sister 27       BRCA positive test   Breast cancer Maternal Grandmother  Lung cancer Maternal Grandfather    Breast cancer Daughter 57   Ovarian cancer Neg Hx    Colon cancer Neg Hx    Stroke Neg Hx    Heart disease Neg Hx     Social History   Socioeconomic History   Marital status: Married    Spouse name: Not on file   Number of children: Not on file   Years of education: Not on file   Highest education level: Not on file  Occupational History   Not on file  Tobacco Use   Smoking status: Every Day    Current packs/day: 0.50    Average packs/day: 0.5 packs/day for 30.0 years (15.0 ttl pk-yrs)    Types: Cigarettes   Smokeless tobacco: Former   Tobacco comments:    14, 20s  Vaping Use   Vaping status: Former   Substances: Nicotine , Flavoring  Substance and Sexual Activity   Alcohol use: No    Drug use: No   Sexual activity: Not on file  Other Topics Concern   Not on file  Social History Narrative   Not on file   Social Drivers of Health   Tobacco Use: High Risk (07/22/2024)   Patient History    Smoking Tobacco Use: Every Day    Smokeless Tobacco Use: Former    Passive Exposure: Not on Actuary Strain: Low Risk (07/07/2023)   Overall Financial Resource Strain (CARDIA)    Difficulty of Paying Living Expenses: Not hard at all  Food Insecurity: Food Insecurity Present (07/07/2023)   Hunger Vital Sign    Worried About Running Out of Food in the Last Year: Sometimes true    Ran Out of Food in the Last Year: Never true  Transportation Needs: No Transportation Needs (07/07/2023)   PRAPARE - Administrator, Civil Service (Medical): No    Lack of Transportation (Non-Medical): No  Physical Activity: Sufficiently Active (07/07/2023)   Exercise Vital Sign    Days of Exercise per Week: 7 days    Minutes of Exercise per Session: 150+ min  Stress: No Stress Concern Present (07/07/2023)   [employer] of Occupational Health - Occupational Stress Questionnaire    Feeling of Stress : Not at all  Social Connections: Moderately Integrated (07/07/2023)   Social Connection and Isolation Panel    Frequency of Communication with Friends and Family: More than three times a week    Frequency of Social Gatherings with Friends and Family: More than three times a week    Attends Religious Services: 1 to 4 times per year    Active Member of [employer] or Organizations: No    Attends Banker Meetings: Never    Marital Status: Married  Catering Manager Violence: Not At Risk (07/07/2023)   Humiliation, Afraid, Rape, and Kick questionnaire    Fear of Current or Ex-Partner: No    Emotionally Abused: No    Physically Abused: No    Sexually Abused: No  Depression (PHQ2-9): Low Risk (07/22/2024)   Depression (PHQ2-9)    PHQ-2 Score: 0  Alcohol Screen: Not  on file  Housing: Low Risk (07/07/2023)   Housing    Last Housing Risk Score: 0  Utilities: Not At Risk (07/07/2023)   AHC Utilities    Threatened with loss of utilities: No  Health Literacy: Adequate Health Literacy (07/07/2023)   B1300 Health Literacy    Frequency of need for help with medical instructions: Never     Constitutional: Patient  reports intermittent headaches.  Denies fever, malaise, fatigue, or abrupt weight changes.  HEENT: Patient reports sinus pressure, bilateral ear fullness, runny nose.  Denies eye pain, eye redness, ear pain, ringing in the ears, wax buildup, nasal congestion, bloody nose, or sore throat. Respiratory: Pt reports chronic cough. Denies difficulty breathing, shortness of breath, or sputum production.   Cardiovascular: Denies chest pain, chest tightness, palpitations or swelling in the hands or feet.  Gastrointestinal:  Pt reports nausea. Denies abdominal pain, bloating, constipation, diarrhea or blood in the stool.  GU: Patient reports urinary frequency and urgency.  Denies pain with urination, burning sensation, blood in urine, odor or discharge. Musculoskeletal: Denies decrease in range of motion, difficulty with gait, muscle pain or joint pain and swelling.  Skin: Denies redness, rashes, lesions or ulcercations.  Neurological: Patient reports insomnia and restless legs.  Denies dizziness, difficulty with memory, difficulty with speech or problems with balance and coordination.  Psych: Patient has a history of anxiety and depression.  Denies SI/HI.  No other specific complaints in a complete review of systems (except as listed in HPI above).     Objective:   Physical Exam BP 124/78 (BP Location: Right Arm, Patient Position: Sitting, Cuff Size: Normal)   Pulse 77   Ht 5' 2 (1.575 m)   Wt 122 lb 6.4 oz (55.5 kg)   SpO2 97%   BMI 22.39 kg/m     Wt Readings from Last 3 Encounters:  07/22/24 120 lb 3.2 oz (54.5 kg)  03/11/24 115 lb (52.2 kg)   01/08/24 121 lb 6.4 oz (55.1 kg)    General: Appears her stated age, well developed, well nourished in NAD. Skin: Warm, dry and intact.  HEENT: Head: normal shape and size, no sinus tenderness noted; Eyes: sclera white, no icterus, conjunctiva pink, PERRLA and EOMs intact; ears: TM's gray and intact, normal light reflex, + serous effusion bilaterally; Nose: small nares, mucosa boggy and moist; Throat: mucosa pink and moist, + PND, no tonsillar enlargement, exudate or lesions noted. Neck:  Neck supple, trachea midline. No masses, lumps or thyromegaly present.  Cardiovascular: Normal rate and rhythm. S1,S2 noted.  No murmur, rubs or gallops noted.  Pulmonary/Chest: Normal effort and positive vesicular breath sounds. No respiratory distress. No wheezes, rales or ronchi noted.  Musculoskeletal: No difficulty with gait.  Neurological: Alert and oriented.   BMET    Component Value Date/Time   NA 141 07/22/2024 1038   K 4.3 07/22/2024 1038   CL 103 07/22/2024 1038   CO2 30 07/22/2024 1038   GLUCOSE 68 07/22/2024 1038   BUN 9 07/22/2024 1038   CREATININE 0.63 07/22/2024 1038   CALCIUM 9.3 07/22/2024 1038   GFRNONAA >60 03/17/2023 0959   GFRNONAA 98 12/12/2019 0840   GFRAA >60 01/03/2020 1210   GFRAA 113 12/12/2019 0840    Lipid Panel     Component Value Date/Time   CHOL 249 (H) 07/22/2024 1038   TRIG 68 07/22/2024 1038   HDL 92 07/22/2024 1038   CHOLHDL 2.7 07/22/2024 1038   VLDL 12 04/03/2017 0800   LDLCALC 141 (H) 07/22/2024 1038    CBC    Component Value Date/Time   WBC 7.9 07/22/2024 1038   RBC 5.55 (H) 07/22/2024 1038   HGB 16.2 (H) 07/22/2024 1038   HGB 14.2 08/18/2021 1543   HCT 49.1 (H) 07/22/2024 1038   HCT 40.9 08/18/2021 1543   PLT 320 07/22/2024 1038   MCV 88.5 07/22/2024 1038   MCH 29.2 07/22/2024  1038   MCHC 33.0 07/22/2024 1038   RDW 12.8 07/22/2024 1038   LYMPHSABS 1.2 08/16/2022 1544   MONOABS 1.6 (H) 08/16/2022 1544   EOSABS 0.0 08/16/2022 1544    BASOSABS 0.0 08/16/2022 1544    Hgb A1C Lab Results  Component Value Date   HGBA1C 5.3 07/22/2024            Assessment & Plan:    Assessment and Plan    Allergic rhinitis with eustachian tube dysfunction Symptoms consistent with allergy-related sinus issues. No bacterial infection indicated. - Continue off-brand Claritin 10 mg once daily. - Prescribed Flonase , one spray twice a day for three days, then once daily. - Prescribed six-day prednisone  10 mg taper. - Continue current over-the-counter cough syrup if effective. - Report by Monday if no improvement.  Chronic obstructive pulmonary disease COPD well-managed with current medication regimen. No exacerbation noted. - Continue Breztri  daily. - Use albuterol  as needed.        RTC in 6 months, follow-up chronic conditions Angeline Laura, NP

## 2024-08-08 ENCOUNTER — Encounter: Payer: Self-pay | Admitting: Internal Medicine

## 2024-08-08 ENCOUNTER — Ambulatory Visit: Admitting: Internal Medicine

## 2024-08-08 VITALS — BP 124/78 | HR 77 | Ht 62.0 in | Wt 122.4 lb

## 2024-08-08 DIAGNOSIS — J410 Simple chronic bronchitis: Secondary | ICD-10-CM | POA: Diagnosis not present

## 2024-08-08 DIAGNOSIS — H6993 Unspecified Eustachian tube disorder, bilateral: Secondary | ICD-10-CM

## 2024-08-08 DIAGNOSIS — J3089 Other allergic rhinitis: Secondary | ICD-10-CM | POA: Diagnosis not present

## 2024-08-08 MED ORDER — PREDNISONE 10 MG PO TABS: ORAL_TABLET | ORAL | 0 refills | Status: AC

## 2024-08-08 MED ORDER — FLUTICASONE PROPIONATE 50 MCG/ACT NA SUSP: 2.0000 | Freq: Every day | NASAL | 3 refills | Status: AC

## 2024-08-08 NOTE — Patient Instructions (Signed)
 Eustachian Tube Dysfunction  Eustachian tube dysfunction refers to a condition in which a blockage develops in the narrow passage that connects the middle ear to the back of the nose (eustachian tube). The eustachian tube regulates air pressure in the middle ear by letting air move between the ear and nose. It also helps to drain fluid from the middle ear space. Eustachian tube dysfunction can affect one or both ears. When the eustachian tube does not function properly, air pressure, fluid, or both can build up in the middle ear. What are the causes? This condition occurs when the eustachian tube becomes blocked or cannot open normally. Common causes of this condition include: Ear infections. Colds and other infections that affect the nose, mouth, and throat (upper respiratory tract). Allergies. Irritation from cigarette smoke. Irritation from stomach acid coming up into the esophagus (gastroesophageal reflux). The esophagus is the part of the body that moves food from the mouth to the stomach. Sudden changes in air pressure, such as from descending in an airplane or scuba diving. Abnormal growths in the nose or throat, such as: Growths that line the nose (nasal polyps). Abnormal growth of cells (tumors). Enlarged tissue at the back of the throat (adenoids). What increases the risk? You are more likely to develop this condition if: You smoke. You are overweight. You are a child who has: Certain birth defects of the mouth, such as cleft palate. Large tonsils or adenoids. What are the signs or symptoms? Common symptoms of this condition include: A feeling of fullness in the ear. Ear pain. Clicking or popping noises in the ear. Ringing in the ear (tinnitus). Hearing loss. Loss of balance. Dizziness. Symptoms may get worse when the air pressure around you changes, such as when you travel to an area of high elevation, fly on an airplane, or go scuba diving. How is this diagnosed? This  condition may be diagnosed based on: Your symptoms. A physical exam of your ears, nose, and throat. Tests, such as those that measure: The movement of your eardrum. Your hearing (audiometry). How is this treated? Treatment depends on the cause and severity of your condition. In mild cases, you may relieve your symptoms by moving air into your ears. This is called "popping the ears." In more severe cases, or if you have symptoms of fluid in your ears, treatment may include: Medicines to relieve congestion (decongestants). Medicines that treat allergies (antihistamines). Nasal sprays or ear drops that contain medicines that reduce swelling (steroids). A procedure to drain the fluid in your eardrum. In this procedure, a small tube may be placed in the eardrum to: Drain the fluid. Restore the air in the middle ear space. A procedure to insert a balloon device through the nose to inflate the opening of the eustachian tube (balloon dilation). Follow these instructions at home: Lifestyle Do not do any of the following until your health care provider approves: Travel to high altitudes. Fly in airplanes. Work in a Estate agent or room. Scuba dive. Do not use any products that contain nicotine or tobacco. These products include cigarettes, chewing tobacco, and vaping devices, such as e-cigarettes. If you need help quitting, ask your health care provider. Keep your ears dry. Wear fitted earplugs during showering and bathing. Dry your ears completely after. General instructions Take over-the-counter and prescription medicines only as told by your health care provider. Use techniques to help pop your ears as recommended by your health care provider. These may include: Chewing gum. Yawning. Frequent, forceful swallowing.  Closing your mouth, holding your nose closed, and gently blowing as if you are trying to blow air out of your nose. Keep all follow-up visits. This is important. Contact a  health care provider if: Your symptoms do not go away after treatment. Your symptoms come back after treatment. You are unable to pop your ears. You have: A fever. Pain in your ear. Pain in your head or neck. Fluid draining from your ear. Your hearing suddenly changes. You become very dizzy. You lose your balance. Get help right away if: You have a sudden, severe increase in any of your symptoms. Summary Eustachian tube dysfunction refers to a condition in which a blockage develops in the eustachian tube. It can be caused by ear infections, allergies, inhaled irritants, or abnormal growths in the nose or throat. Symptoms may include ear pain or fullness, hearing loss, or ringing in the ears. Mild cases are treated with techniques to unblock the ears, such as yawning or chewing gum. More severe cases are treated with medicines or procedures. This information is not intended to replace advice given to you by your health care provider. Make sure you discuss any questions you have with your health care provider. Document Revised: 10/19/2020 Document Reviewed: 10/19/2020 [employer] Patient Education  2024 [employer].

## 2024-08-20 ENCOUNTER — Telehealth: Payer: Self-pay

## 2024-08-20 NOTE — Telephone Encounter (Signed)
 Patient advised. Declined ENT referral at this time. Will give it another week and call back if needed. Has been using steroid nasal spray and will continue.

## 2024-08-20 NOTE — Telephone Encounter (Signed)
 Another round of steroids will not help.  If she using any steroid nasal sprays?  Our only other option at this point would be referred to ENT for further evaluation

## 2024-08-20 NOTE — Telephone Encounter (Signed)
 Copied from CRM [id number]. Topic: Clinical - Medical Advice >> Aug 20, 2024  2:01 PM Nathanel BROCKS wrote: Reason for CRM: pt called and stated that you advised her to call back if her ears was not cleared up when she got done with the medication you gave her. She said her ear still hurts and she has a bad cough. Does she need another round of steroids? Please advise.

## 2024-11-11 ENCOUNTER — Ambulatory Visit: Admit: 2024-11-11 | Admitting: Gastroenterology

## 2024-11-11 SURGERY — COLONOSCOPY
Anesthesia: General

## 2025-01-20 ENCOUNTER — Ambulatory Visit: Admitting: Internal Medicine
